# Patient Record
Sex: Female | Born: 1935 | Race: White | Hispanic: No | Marital: Married | Smoking: Never smoker
Health system: Southern US, Community
[De-identification: ages and names within clinical notes are randomized; demographics above are authoritative.]

## PROBLEM LIST (undated history)

## (undated) DIAGNOSIS — I4891 Unspecified atrial fibrillation: Secondary | ICD-10-CM

## (undated) DIAGNOSIS — M1990 Unspecified osteoarthritis, unspecified site: Secondary | ICD-10-CM

## (undated) DIAGNOSIS — I639 Cerebral infarction, unspecified: Secondary | ICD-10-CM

## (undated) DIAGNOSIS — I10 Essential (primary) hypertension: Secondary | ICD-10-CM

## (undated) DIAGNOSIS — I509 Heart failure, unspecified: Secondary | ICD-10-CM

## (undated) DIAGNOSIS — I739 Peripheral vascular disease, unspecified: Secondary | ICD-10-CM

## (undated) DIAGNOSIS — I495 Sick sinus syndrome: Secondary | ICD-10-CM

## (undated) DIAGNOSIS — E785 Hyperlipidemia, unspecified: Secondary | ICD-10-CM

## (undated) HISTORY — DX: Hyperlipidemia, unspecified: E78.5

## (undated) HISTORY — DX: Sick sinus syndrome: I49.5

## (undated) HISTORY — DX: Peripheral vascular disease, unspecified: I73.9

## (undated) HISTORY — DX: Essential (primary) hypertension: I10

## (undated) HISTORY — PX: THYROID SURGERY: SHX805

## (undated) HISTORY — DX: Unspecified atrial fibrillation: I48.91

## (undated) HISTORY — PX: CHOLECYSTECTOMY: SHX55

## (undated) HISTORY — DX: Unspecified osteoarthritis, unspecified site: M19.90

## (undated) HISTORY — PX: PACEMAKER INSERTION: SHX728

---

## 2005-07-11 ENCOUNTER — Encounter: Payer: Self-pay | Admitting: Internal Medicine

## 2005-07-12 ENCOUNTER — Observation Stay (HOSPITAL_COMMUNITY): Admission: AD | Admit: 2005-07-12 | Discharge: 2005-07-13 | Payer: Self-pay | Admitting: Internal Medicine

## 2006-08-07 ENCOUNTER — Inpatient Hospital Stay (HOSPITAL_COMMUNITY): Admission: EM | Admit: 2006-08-07 | Discharge: 2006-08-11 | Payer: Self-pay | Admitting: Emergency Medicine

## 2006-08-07 ENCOUNTER — Ambulatory Visit: Payer: Self-pay | Admitting: Cardiology

## 2006-10-06 ENCOUNTER — Emergency Department (HOSPITAL_COMMUNITY): Admission: EM | Admit: 2006-10-06 | Discharge: 2006-10-06 | Payer: Self-pay | Admitting: Emergency Medicine

## 2007-03-17 ENCOUNTER — Emergency Department (HOSPITAL_COMMUNITY): Admission: EM | Admit: 2007-03-17 | Discharge: 2007-03-17 | Payer: Self-pay | Admitting: Emergency Medicine

## 2007-11-09 ENCOUNTER — Emergency Department (HOSPITAL_COMMUNITY): Admission: EM | Admit: 2007-11-09 | Discharge: 2007-11-09 | Payer: Self-pay | Admitting: Emergency Medicine

## 2008-03-06 ENCOUNTER — Emergency Department (HOSPITAL_COMMUNITY): Admission: EM | Admit: 2008-03-06 | Discharge: 2008-03-06 | Payer: Self-pay | Admitting: Emergency Medicine

## 2009-08-19 ENCOUNTER — Emergency Department (HOSPITAL_COMMUNITY): Admission: EM | Admit: 2009-08-19 | Discharge: 2009-08-19 | Payer: Self-pay | Admitting: Emergency Medicine

## 2010-03-20 ENCOUNTER — Encounter: Payer: Self-pay | Admitting: Interventional Cardiology

## 2010-05-15 LAB — CBC
Hemoglobin: 14 g/dL (ref 12.0–15.0)
MCH: 31.3 pg (ref 26.0–34.0)
Platelets: 272 10*3/uL (ref 150–400)
WBC: 12.6 10*3/uL — ABNORMAL HIGH (ref 4.0–10.5)

## 2010-05-15 LAB — URINE CULTURE
Colony Count: NO GROWTH
Culture: NO GROWTH

## 2010-05-15 LAB — BASIC METABOLIC PANEL
Calcium: 8.9 mg/dL (ref 8.4–10.5)
GFR calc Af Amer: >60 mL/min (ref >60)
GFR calc non Af Amer: >60 mL/min (ref >60)
Potassium: 3.3 mEq/L — ABNORMAL LOW (ref 3.5–5.1)
Sodium: 127 mEq/L — ABNORMAL LOW (ref 135–145)

## 2010-05-15 LAB — URINALYSIS, ROUTINE W REFLEX MICROSCOPIC
Hgb urine dipstick: NEGATIVE
Nitrite: POSITIVE — AB
Protein, ur: NEGATIVE mg/dL
Specific Gravity, Urine: 1.018 (ref 1.005–1.030)
pH: 5 (ref 5.0–8.0)

## 2010-05-15 LAB — URINE MICROSCOPIC-ADD ON

## 2010-05-15 LAB — DIFFERENTIAL
Basophils Relative: 0 % (ref 0–1)
Eosinophils Relative: 0 % (ref 0–5)
Lymphocytes Relative: 3 % — ABNORMAL LOW (ref 12–46)
Monocytes Relative: 4 % (ref 3–12)
Neutro Abs: 11.7 10*3/uL — ABNORMAL HIGH (ref 1.7–7.7)

## 2010-05-15 LAB — PROTIME-INR: Prothrombin Time: 21.1 seconds — ABNORMAL HIGH (ref 11.6–15.2)

## 2010-06-13 LAB — URINALYSIS, ROUTINE W REFLEX MICROSCOPIC
Bilirubin Urine: NEGATIVE
Glucose, UA: NEGATIVE mg/dL
Hgb urine dipstick: NEGATIVE
Protein, ur: 30 mg/dL — AB
Specific Gravity, Urine: 1.027 (ref 1.005–1.030)
Urobilinogen, UA: 1 mg/dL (ref 0.0–1.0)
pH: 6 (ref 5.0–8.0)

## 2010-06-13 LAB — URINE MICROSCOPIC-ADD ON

## 2010-07-01 ENCOUNTER — Emergency Department (HOSPITAL_COMMUNITY): Payer: Medicare Other

## 2010-07-01 ENCOUNTER — Emergency Department (HOSPITAL_COMMUNITY)
Admission: EM | Admit: 2010-07-01 | Discharge: 2010-07-01 | Disposition: A | Discharge status: Home / Self Care | Payer: Medicare Other | Attending: Emergency Medicine | Admitting: Emergency Medicine

## 2010-07-01 DIAGNOSIS — I4891 Unspecified atrial fibrillation: Secondary | ICD-10-CM | POA: Insufficient documentation

## 2010-07-01 DIAGNOSIS — R42 Dizziness and giddiness: Secondary | ICD-10-CM | POA: Insufficient documentation

## 2010-07-01 DIAGNOSIS — I10 Essential (primary) hypertension: Secondary | ICD-10-CM | POA: Insufficient documentation

## 2010-07-01 DIAGNOSIS — E871 Hypo-osmolality and hyponatremia: Secondary | ICD-10-CM | POA: Insufficient documentation

## 2010-07-01 DIAGNOSIS — E119 Type 2 diabetes mellitus without complications: Secondary | ICD-10-CM | POA: Insufficient documentation

## 2010-07-01 DIAGNOSIS — Z950 Presence of cardiac pacemaker: Secondary | ICD-10-CM | POA: Insufficient documentation

## 2010-07-01 LAB — URINALYSIS, ROUTINE W REFLEX MICROSCOPIC
Bilirubin Urine: NEGATIVE
Hgb urine dipstick: NEGATIVE
Protein, ur: NEGATIVE mg/dL
Urobilinogen, UA: 0.2 mg/dL (ref 0.0–1.0)

## 2010-07-01 LAB — DIFFERENTIAL
Basophils Absolute: 0 10*3/uL (ref 0.0–0.1)
Basophils Relative: 0 % (ref 0–1)
Eosinophils Absolute: 0.2 10*3/uL (ref 0.0–0.7)
Lymphocytes Relative: 22 % (ref 12–46)
Lymphs Abs: 2.2 10*3/uL (ref 0.7–4.0)
Monocytes Relative: 10 % (ref 3–12)

## 2010-07-01 LAB — COMPREHENSIVE METABOLIC PANEL
ALT: 12 U/L (ref 0–35)
AST: 20 U/L (ref 0–37)
CO2: 28 mEq/L (ref 19–32)
Calcium: 9.9 mg/dL (ref 8.4–10.5)
Chloride: 89 mEq/L — ABNORMAL LOW (ref 96–112)
GFR calc Af Amer: >60 mL/min (ref >60)
GFR calc non Af Amer: >60 mL/min (ref >60)
Sodium: 128 mEq/L — ABNORMAL LOW (ref 135–145)

## 2010-07-01 LAB — CBC
Hemoglobin: 15.1 g/dL — ABNORMAL HIGH (ref 12.0–15.0)
MCH: 30.6 pg (ref 26.0–34.0)
MCHC: 34.7 g/dL (ref 30.0–36.0)
Platelets: 339 10*3/uL (ref 150–400)
RDW: 12.8 % (ref 11.5–15.5)

## 2010-07-01 LAB — CK TOTAL AND CKMB (NOT AT ARMC): Total CK: 35 U/L (ref 7–177)

## 2010-07-12 NOTE — H&P (Signed)
NAMEJUDEA, [name]             ACCOUNT NO.:  [id number]   MEDICAL RECORD NO.:  [id number]          PATIENT TYPE:  INP   LOCATION:  1825                         FACILITY:  [facility]   PHYSICIAN:  Lowella Bandy, MD      DATE OF BIRTH:  [birth year]   DATE OF ADMISSION:  08/07/2006  DATE OF DISCHARGE:                              HISTORY & PHYSICAL   PRIMARY CARE PHYSICIAN:  Dr. Burna Mortimer.   PRIMARY CARDIOLOGIST:  Corky Crafts, M.D.   CHIEF COMPLAINT:  Palpitations.   HISTORY OF PRESENT ILLNESS:  The patient is a very pleasant 77 year old  Caucasian female with a history of hypertension, diabetes, and  hyperlipidemia, who started having palpitations yesterday evening that  initially subsided.  They then recurred this afternoon and got  progressively worse causing her significant anxiety.  She denies any  associated chest pain, shortness of breath, syncope, near syncope, or  dizziness.  She denies any problems with focal weakness, numbness, or  speech difficulty.  Due to the persistent palpitations and her concern,  she activated EMS and was found to be in atrial fibrillation with rapid  ventricular response.  Her heart rate was initially approximately 120 on  arrival to the emergency department.   PAST MEDICAL HISTORY:  1. Diabetes mellitus.  2. Hypertension.  3. Hyperlipidemia.   MEDICATIONS:  1. Triamterene/hydrochlorothiazide 37.5/25 one tablet once a day.  2. Labetalol 100 mg p.o. b.i.d.  3. Captopril, the patient uncertain of dose.  4. Glyburide 5 mg p.o. once a day.  5. Zocor 20 mg p.o. once a day.   ALLERGIES:  SULFA, but has tolerated hydrochlorothiazide.   SOCIAL HISTORY:  The patient is not a smoker and does not drink alcohol.  She lives with her husband and has been married for over 64 years.   FAMILY HISTORY:  She had two brothers die of myocardial infarction.   REVIEW OF SYSTEMS:  She reports that she had a mechanical fall  approximately 3 weeks  ago, which was the first significant fall that she  has had.  She did not suffer any significant injury from this event.  Otherwise, 10 systems reviewed and negative other than as noted above in  the HPI.   PHYSICAL EXAMINATION:  VITAL SIGNS:  Blood pressure 111/41, pulse 85  (atrial fibrillation by monitoring), oxygen saturation 97% on room air.  These vitals were obtained on Diltiazem drip at 5 mg an hour.  GENERAL:  The patient is breathing comfortably in no apparent distress.  HEENT:  Normocephalic and atraumatic.  No icterus.  Oropharynx clear.  NECK:  Supple.  No carotid bruits.  No JVD.  CARDIOVASCULAR:  Irregularly irregular rhythm, no murmurs, rubs, or  gallops.  CHEST:  Clear to auscultation bilaterally.  ABDOMEN:  Soft, nontender, and nondistended.  EXTREMITIES:  Warm, no edema.  NEUROLOGY:  Alert and oriented x3.  Cranial nerves II-XII grossly  intact.  Moving all extremities well.  SKIN:  No rashes.  MUSCULOSKELETAL:  No joint effusions or erythema.  PSYCHIATRIC:  Affect is pleasant and appropriate.  The patient is alert  and  oriented.   LABORATORY DATA:  Pending.   EKG shows atrial fibrillation at 94 beats per minute, low voltage, no ST  or T wave abnormalities.   PREVIOUS CARDIAC STUDIES:  She had an Adenosine SPECT in May of 2007  that was negative for inducible ischemia and showed normal ejection  fraction of 71%.   ASSESSMENT:  An 77 year old female with diabetes, hypertension, and  hyperlipidemia who presents with presumed new onset atrial fibrillation  with rapid ventricular response.  Her rate has easily been brought under  control in the emergency department with low dose Diltiazem drip.  She  is aware of her atrial fibrillation but otherwise asymptomatic.   PLAN:  1. We will start metoprolol 50 mg p.o. b.i.d. in an attempt to wean      her off the Diltiazem drip.  2. Given that she has not had any recent concerns for bleeding or      contraindications,  we will initiate therapeutic Lovenox at 1 mg/kg      every 12 hours for initial anticoagulation.  Dr. Eldridge Dace can      address the consideration of longterm anticoagulation after further      discussions with the patient.  3. We will check admission labs, cycle cardiac enzymes, and check      thyroid functions.  4. We will continue the remainder of her home medications.  I have not      started Captopril given that we do not know her dose and her blood      pressure is under control at present in any event.  5. We will hold her NPO after midnight tonight for possible      transesophageal echocardiogram and DC cardioversion tomorrow.      Lowella Bandy, MD  Electronically Signed     JJC/MEDQ  D:  08/07/2006  T:  08/07/2006  Job:  [id number]

## 2010-07-12 NOTE — Op Note (Signed)
NAMEIVERNA, HAMMAC             ACCOUNT NO.:  [id number]   MEDICAL RECORD NO.:  [id number]          PATIENT TYPE:  INP   LOCATION:  2025                         FACILITY:  [facility]   PHYSICIAN:  Francisca December, M.D.  DATE OF BIRTH:  [birth year]   DATE OF PROCEDURE:  08/08/2006  DATE OF DISCHARGE:                               OPERATIVE REPORT   PROCEDURES PERFORMED:  1. Insertion of dual chamber permanent transvenous pacemaker.  2. Left subclavian venogram.   INDICATIONS:  [name] is an 77 year old woman who was admitted  August 07, 2006, with new onset atrial fibrillation.  She had rapid  ventricular response.  She was treated with Diltiazem and metoprolol.  She subsequently converted to sinus rhythm during the evening and had a  4.6 second pause in her heart rate.  She is brought to the  catheterization laboratory at this time for insertion of a dual chamber  permanent transvenous pacemaker with a diagnosis of tachybrady syndrome.   PROCEDURE NOTE:  The patient was brought to the cardiac catheterization  laboratory in a fasting state.  The left prepectoral region was prepped  and draped in the usual sterile fashion.  Local anesthesia was obtained  with infiltration of 1% lidocaine with epinephrine throughout.  A 6-7 cm  incision was then made in the deltopectoral groove and this was carried  down by sharp dissection and electrocautery to the prepectoral fascia.  There, a pocket was formed inferiorly and medially using electrocautery  and blunt dissection.  It was packed with 1% kanamycin soaked gauze.  A  left subclavian venogram was then performed with a peripheral injection  of 20 mL of Omnipaque.  The venogram did demonstrate the subclavian vein  to be widely patent and coursing in a normal fashion over the anterior  surface of the first rib and beneath the middle third of the clavicle.  There was no evidence for persistence of the left superior vena cava,  although the  left subclavian vein was quite tortuous.   The subclavian vein was then punctured twice using a micropuncture  needle.  Two 0.038 inch guidewires were placed.  Over the initial  guidewire, a 7-French tearaway sheath and dilator was advanced.  The  dilator and wire were removed and the ventricular lead was advanced to  the level of the right atrium.  The sheath was torn away.  Using  standard technique and fluoroscopic landmarks, the lead was manipulated  into the right ventricular apex.  There, excellent pacing parameters  were obtained as will be noted below.  The lead was tested for  diaphragmatic pacing at 10 volts and none was found.  The lead was then  sutured into place using three separate silk ligatures.  Over the  remaining guidewire, a second 7-French tearaway sheath and dilator were  advanced.  The dilator and wire were removed and the atrial lead was  advanced to the level of the right atrium and the sheath was torn away.  Again, using standard technique and fluoroscopic landmarks, the lead was  manipulated into the right atrial appendage.  This was an  active  fixation lead and the screw was advanced as appropriate.  It was tested  for adequate pacing parameters and this is reported below.  It was also  tested for diaphragmatic pacing at 10 volts and none was found.  The  lead was then sutured into place using three separate silk ligatures.   The kanamycin soaked gauze was then removed from the pocket.  The pocket  was copiously irrigated using 1% kanamycin solution.  The leads were  then attached the pacing generator, carefully identifying each by its  serial number, and placing each into the appropriate receptacle.  Each  lead was tightened into place and tested for security.  The leads were  then wound beneath the pacing generator and the generator was placed in  the pocket.  An 0 silk anchoring suture was applied.  The wound was then  inspected for bleeding and full  hemostasis obtained.  The wound was then  closed using 2-0 Vicryl in a running fashion in the subcutaneous layer.  The skin was approximated using 4-0 Vicryl in a running subcuticular  fashion.  Steri-Strips and a sterile dressing were applied and the  patient is transported to the recovery area in stable condition.   EQUIPMENT DATA:  The pacing generator is a Medtronic Enrhythm, model  number [id number], serial number [id number].  The atrial lead is a  Medtronic, model number [id number], serial number [id number].  The ventricular  lead is a Medtronic, model number [id number], serial number [id number].   PACING DATA:  The ventricular lead detected a 9.1 mV R-wave.  The pacing  threshold was 0.5 volts at 0.5 milliseconds pulse width.  The impedance  was 700 ohms resulting in a current at capture threshold of 0.6 MA.  The  atrial lead detected a 5.1 mV P-wave.  The pacing threshold was 1.9  volts at 0.5 milliseconds pulse width.  The impedance was 765 ohms  resulting in a current at capture threshold of 3.8 MA.      Francisca December, M.D.  Electronically Signed     JHE/MEDQ  D:  08/08/2006  T:  08/08/2006  Job:  [id number]   cc:   Corky Crafts, MD

## 2010-07-15 NOTE — Discharge Summary (Signed)
NAMECARLY, [name]             ACCOUNT NO.:  [id number]   MEDICAL RECORD NO.:  [id number]          PATIENT TYPE:  INP   LOCATION:  2025                         FACILITY:  [facility]   PHYSICIAN:  Corky Crafts, MDDATE OF BIRTH:  24-Nov-1918   DATE OF ADMISSION:  08/07/2006  DATE OF DISCHARGE:  08/11/2006                               DISCHARGE SUMMARY   DISCHARGE DIAGNOSES:  1. Sinus node dysfunction, status post dual-chamber implantation,      Medtronic type.  2. Atrial fibrillation.  3. Hypertension.  4. Diabetes mellitus.  5. Hyperlipidemia.   HOSPITAL COURSE:  [name] is an 77 year old female who had 24  hours worth of palpitations.  She presented to the emergency room and  found to be in atrial fibrillation with RVR.  She was felt to have a new  onset atrial fibrillation and ultimately we had planned to cardiovert  the patient, but overnight she had spontaneously converted into normal  sinus rhythm.  While in sinus rhythm, she had a 4.7 second pause and  ultimately, she did require permanent pacemaker.   On August 08, 2006, she required a dual-chamber pacemaker implantation,  Medtronic device, under the care of Dr. Corliss Marcus.  The patient  tolerated the procedure well.  The pacemaker was interrogated the  following day and was functioning properly.   Unfortunately, postoperatively she did redevelop her atrial fibrillation  with RVR and we had to maximize her medications.  In addition, she was  started on sotalol and monitored over the next 48 hours to assure that  her QTC was not prolonging.  QTC on discharge was 476 milliseconds.  Patient was discharged to home in stable and improved condition.   LABORATORY DATA:  Include a white count of 11.0, hemoglobin 12.7,  hematocrit 37.5, platelets 264.  Sodium 137, potassium 4.1, BUN 11,  creatinine 0.76.  Hemoglobin A1c 6.7.  Maximum troponin 0.39.  TSH  3.031.  Free T4 1.35.  EKG, on admission, while in normal  sinus rhythm,  fast bradycardiac rate of 51 with no acute ST/T wave changes (prepacer).   DISCHARGE MEDICATIONS:  1. Enteric-coated aspirin 81 mg a day.  2. Coumadin 5 mg a day.  3. Sotalol 80 mg p.o. b.i.d.  4. Zocor 20 mg a day.  5. Glyburide 5 mg.  6. Metoprolol ER 50 mg a day.  7. The patient is instructed to stop captopril and labetalol.   Clean wound and continue activity as per specific instruction sheet  regarding device implantation.  The patient is to follow up with the  [facility] and Dr. Amil Amen' PA on August 16, 2006 at 1 p.m., then the  patient is to return to see Dr. Eldridge Dace for cardiac followup on September 04, 2006 at 9 a.m.      Guy Franco, P.A.      Corky Crafts, MD  Electronically Signed    LB/MEDQ  D:  09/05/2006  T:  09/06/2006  Job:  [id number]   cc:   Francine Graven, M.D.

## 2010-07-15 NOTE — Discharge Summary (Signed)
NAME:  [name], [name]             ACCOUNT NO.:  [id number]   MEDICAL RECORD NO.:  [id number]          PATIENT TYPE:  INP   LOCATION:  3707                         FACILITY:  [facility]   PHYSICIAN:  Melissa L. Ladona Ridgel, MD  DATE OF BIRTH:  March 29, 1918   DATE OF ADMISSION:  07/12/2005  DATE OF DISCHARGE:  07/13/2005                                 DISCHARGE SUMMARY   Chief complaint at the time of admission with abdominal pain, palpitations,  dizziness and shortness of breath.   DISCHARGE DIAGNOSES:  1.  Transient abdominal pain:  The patient had no recurrent abdominal      discomfort during the hospital stay at home.  The pain was relieved with      movement of her bowels.  Suspected constipation is at the source of her      issue for that.  2.  Dizziness, palpitations and shortness of breath:  The clinical      significance of these transient symptoms on two separate episodes led [place]      to rule out a possible arrhythmia secondary to ischemia.  The patient      was seen and evaluated by cardiology.  She underwent a cardiac stress      test which showed no obvious ischemic changes.  Her ejection fraction      was 73%.  She had no recurrent symptoms of dizziness or palpitations.      Please note during the course of the treadmill test, she had one brief      burst ventricular tachycardia of minimal significant according to      cardiology.  She did not require any further monitoring and was      discharged to home.  No changes to her medications were made and she was      instructed to follow up with Dr. Madison Hickman as she had had an      appointment the day that she came to the hospital.  We will ask her to      make the appointment next week to follow up on the other medical issues      that she was going to see Dr. Daphine Deutscher for.  At this time, cardiology may      no follow-up recommendations accept as per needed.  3.  Diabetes:  The patient was maintained on her metformin and should  follow      up with Dr. Daphine Deutscher regarding her hemoglobin A1c and ophthalmology and      podiatric consult.  4.  Hyperlipidemia:  The patient was maintained on her Zocor.  5.  Urinary tract infection based on her urinalysis.  The patient had had      some strong smelling urine and some dysuria.  Her urinalysis showed      positive leukocyte esterase.  She was placed on Cipro for a total course      of four days.  She had no Foley catheter during the course of the      hospital stay and her final urine cultures have been negative.   MEDICATIONS AT  THE TIME OF DISCHARGE:  1.  Captopril 100 mg 1/2 tablet three times daily.  2.  Metformin 500 mg 2 tablets in the a.m.  3.  Triamterene/hydrochlorothiazide 37.5 mg/25 mg once daily.  4.  Zocor 20 mg once daily.  5.  Aspirin 81 mg once daily.  6.  Ocuvite 1 tablet daily.  7.  Lopressor 25 mg once daily.  8.  Cipro 500 mg p.o. b.i.d. for two more days.   HOSPITAL COURSE:  This is a pleasant 77 year old white female who presented  to the emergency room after having two episodes of dizziness, palpitations,  shortness of breath and weakness.  Each time the patient was exerting  herself coming from the restroom.  The patient was admitted to telemetry,  monitored overnight, found to have no dysrhythmia.  Her cardiac markers were  negative.  A cardiology consult was undertaken and it was determined that  her symptoms were suspicious enough for possible intermittent dysrhythmia  versus ischemia that she was asked to undergo cardiac stress test.  The  patient was transferred from [facility] to [facility].  Underwent cardiac stress test which showed a few beats of ventricular  tachycardia but no other abnormalities or indications of ischemia.  Her  ejection fraction was 73%.  The patient was therefore cleared to discharge  to home to follow up with her primary care practitioner.   PERTINENT LABORATORY VALUES:  During the course of  hospital stay, her urine  culture was negative.  Her urinalysis, however, with positive.  Cardiac  markers were within normal limits.  Her TSH was 1.578 within normal limits.  Her hemoglobin A1c was 6.1.  Her D-dimer was 0.38.  Her discharging BUN was  19 with a creatinine of 0.9 and a potassium of 4.6.   A 2-D echo was not completed as the patient recently had a work up with Dr.  Eldridge Dace as an outpatient and the general report was that her cardiac  function was within normal limits.  An ejection is unavailable to me at this  time other than from her stress test of 73%.   At the time of discharge, the patient was deemed stable to complete a two  more days on her antibiotic therapy and follow up with Dr. Madison Hickman  next week. Her disposition was to home.      Melissa L. Ladona Ridgel, MD  Electronically Signed     MLT/MEDQ  D:  07/15/2005  T:  07/15/2005  Job:  [id number]   cc:   Darius Bump, M.D.  Fax: [phone]   Corky Crafts, MD  Fax: [phone]

## 2010-07-15 NOTE — H&P (Signed)
NAME:  [name], [name]             ACCOUNT NO.:  [id number]   MEDICAL RECORD NO.:  [id number]          PATIENT TYPE:  INP   LOCATION:  0104                         [facility]ACILITY:  [facility]   PHYSICIAN:  Melissa L. Ladona Ridgel, MD  DATE OF BIRTH:  November 11, 1918   DATE OF ADMISSION:  07/11/2005  DATE OF DISCHARGE:                                HISTORY & PHYSICAL   CHIEF COMPLAINT:  Abdominal cramping, palpitations, dizziness, and shortness  of breath.   PRIMARY CARE PHYSICIAN:  Darius Bump, M.D.   HISTORY OF PRESENT ILLNESS:  The patient is an 77 year old white female with  a past medical history for hypertension since the age of 66 and diabetes.  The patient presents today to the emergency room with onset of symptoms of  dizziness, palpitations, and shortness of breath x2 episodes.  The patient  states that yesterday she had some abdominal cramping that was relieved  after having a bowel movement.  She states that she came out of the bathroom  and felt lightheaded and dizzy with a little bit of palpitations, so she  went to lay down.  The symptoms resolved and her abdominal pain went away.  Today, however, she got out of the shower and had a similar episode which  was more severe.  She noted palpitations, shortness of breath, dizziness,  the feeling of faintness, and had difficulty talking.  She states the only  thing that relieved the symptoms was when the EMS workers placed oxygen on  her.  The palpitations settled after resting on the bed but she continued to  be a little bit dyspneic.   REVIEW OF SYSTEMS:  She describes dizziness and palpitations x2 episodes  yesterday and today.  She states she has been shaky and weak and that she  could not talk during the episode this morning.  She describes no chest  pain.  She has no PND, no orthopnea, no nausea, no vomiting.  She never had  symptoms like this before.  She recently changed from verapamil to Lopressor  and was given Lasix with  potassium after gaining several pounds on the  verapamil.  Her cardiologist felt that the weight gain likely was secondary  to that and switched her over to Lopressor.  She completed a course of the  Lasix and potassium, is no longer taking that and has a 10-pound weight loss  as a result of the diuresis.   PAST MEDICAL HISTORY:  1.  Diabetes.  2.  Hypertension.  3.  Goiter.   PAST SURGICAL HISTORY:  1.  She had gallbladder removed.  2.  She had a goiter removed.  3.  She does not remember whether or not she had a total or subtotal      thyroidectomy.  However, she has not been taking any medication for her      thyroid.   SOCIAL HISTORY:  She denies tobacco, ethanol.  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   FAMILY HISTORY:  Mom and Dad are both deceased but both had hypertension.   ALLERGIES:  No known  drug allergies.   MEDICATIONS:  Recently have changed and are as follows:  1.  She is taking captopril 1.5 tablets of 100 mg three times daily.  2.  She takes Metformin which was recently changed to two tablets of 500 mg      in the morning .  3.  She takes Triamterene/hydrochlorothiazide 37.5/25 once daily.  4.  She takes Zocor 20 mg once daily.  5.  She takes an  Ocuvite once daily.  6.  Baby aspirin once daily.  7.  Metoprolol 25 mg once daily.  8.  Recent finished furosemide 20 mg with 10 of potassium a day.  This was      given to her by Dr. Eldridge Dace.   PHYSICAL EXAMINATION:  VITAL SIGNS:  Temperature is 98.3, blood pressure  initially varied from 113/49 to 186/87, pulse was 92 to 115, and appeared to  be sinus tachycardia throughout the hospital course.  Her respiratory rate  was 17 to 20.  Saturations 96%.  GENERAL:  She is currently in no acute distress.  HEENT:  She is normocephalic atraumatic.  Pupils are equal, round, and  reactive to light.  Extraocular muscles are intact.  Mucous membranes are  moist.  Her tympanic membranes are intact.  NECK:   Supple.  There is no JVD, no lymph nodes, no carotid bruit.  Neck is  supple without enlargement of the thyroid.  CHEST:  Clear to auscultation with no rhonchi, rales, or wheezes.  CARDIOVASCULAR:  Regular rate and rhythm.  Positive S1 S2.  No S3, S4.  No  murmurs, rubs, or gallops.  ABDOMEN:  Soft, nontender, nondistended with positive bowel sounds and  moderately obese.  EXTREMITIES:  Show no clubbing, cyanosis, or edema.  NEUROLOGIC:  She is awake, alert, and oriented x3.  Cranial nerves II-XII  are intact.  Power is 5/5.  DTRs are 2+.  Plantars are downgoing.   PERTINENT LABORATORY VALUES:  Her white count is 10.6.  Her hemoglobin is  14.7, hematocrit is 43.6, platelets are 356.  Her sodium is 136, potassium  is 4.1, chloride is 98, CO2 is 27, BUN is 23, creatinine is 1.1, and glucose  is 195.  Her LFTs are within normal limits.  Chest x-ray was not completed.  EKG shows a sinus tachycardia at 101 with no ST elevation.  Urinalysis shows  moderate leukocyte esterase and white blood cells of 3-6.   ASSESSMENT:  1.  This is an 77 year old white female with a past medical history for      hypertension and diabetes.  She presents with episodic dizziness and      shortness of breath with palpitations, suspicious for possible      dysrhythmia.  The patient will be admitted for observation for atrial      fibrillation or SVT.  2.  The patient also appears to have a urinary tract infection which will be      treated.   PLAN:  1.  Cardiovascular.  Admit to telemetry, continue metoprolol, check cardiac      markers as well as D-dimer.  If the D-dimer is elevated, we may need to      consider a CT of the chest to rule out pulmonary embolus.  2.  Pulmonary.  We will check a chest x-ray and a D-dimer.  As stated we      will potentially rule out for a PE.  3.  GI.  Recent constipation.  We will start  her on a stool softener. 4.  GU.  Possible UTI.  We will check a urine culture and start her  on oral      Cipro.  5.  Endocrine.  We will check a TSH, a hemoglobin A1c, and continue her oral      diabetic medications.  6.  DVT prophylaxis will be with Lovenox.  At this time we will do      prophylactic dosing but if she has an elevated D-dimer or elevated      cardiac markers, we may need to increase that to 1 mg/kg.      Melissa L. Ladona Ridgel, MD  Electronically Signed     MLT/MEDQ  D:  07/11/2005  T:  07/11/2005  Job:  [id number]   cc:   Darius Bump, M.D.  Fax: [phone]   Corky Crafts, MD  Fax: [phone]

## 2010-07-15 NOTE — Consult Note (Signed)
[name], [name]             ACCOUNT NO.:  [id number]   MEDICAL RECORD NO.:  [id number]          PATIENT TYPE:  INP   LOCATION:  0102                         FACILITY:  [facility]   PHYSICIAN:  [name], MDDATE OF BIRTH:  1918/09/05   DATE OF CONSULTATION:  07/12/2005  DATE OF DISCHARGE:                                   CONSULTATION   CONSULTING PHYSICIAN:  [name], M.D.   REASON FOR CONSULTATION:  Palpitations, shortness of breath.   [name] is an 77 year old female with no known history of coronary  artery disease.  She states that she underwent a Cardiolite study in  [place] in 2000, and it was normal.  She saw Dr. Eldridge Johnson for an  initial consultation, on June 19, 2005, for lower extremity edema.  Ultimately, she was changed from verapamil to Toprol and with the Lasix and  potassium supplement for a month and her edema subsided.  She has been  stable with no edema since that time.  As part of his workup, a 2D echo was  performed.  It showed mild aortic sclerosis, mild AI, and a normal EF.   We have been consulted today to evaluate the patient due to development of  palpitations, dyspnea, shakiness, and presyncope.  The patient denies any  chest pain, nausea or vomiting.  She admits to having some hypoglycemic  episodes in the past but not similar to the episode she had yesterday.  In  fact, on admission her glucose was 195.   ALLERGIES:  SULFA DRUGS.   MEDICATIONS:  1.  Captopril 150 mg t.i.d.  2.  Metformin 1 gram b.i.d.  3.  Maxzide 37.5/25, one tablet daily.  4.  Zocor 20 mg a day.  5.  Ocuvite daily.  6.  Baby aspirin daily.  7.  Toprol XL 25 mg a day.   SOCIAL HISTORY:  Married, no tobacco, alcohol, or illicit drug use.   FAMILY HISTORY:  Mom died secondary to hypertension and kidney failure.  She  never allowed her physicians to place her on dialysis.  Her father died of  hypertension and lung cancer.  Two brothers died  secondary to myocardial  infarctions.  One sister died secondary to stomach cancer.  One sister died  secondary to uterine cancer.   PAST MEDICAL HISTORY:  1.  Diabetes mellitus.  2.  Hard of hearing.  3.  Hypertension .  4.  Hyperlipidemia.  5.  Status post goiter removal.  6.  Cholecystectomy.  7.  History of arthritis.  8.  History of long term medication use.   PHYSICAL EXAMINATION:  VITAL SIGNS:  Temp 98.4, blood pressure 130/40, pulse  83, respirations 18.  HEENT:  Grossly normal.  No carotid or subclavian bruits.  No JVD or  thyromegaly.  Sclerae are clear.  Conjunctivae normal.  Nares without  drainage.  LUNGS:  Clear to auscultation bilaterally.  No wheezing or rhonchi.  HEART:  Regular rate and rhythm.  Distant S1 S2.  No gross murmur.  ABDOMEN:  Obese.  Good bowel sounds.  Nontender, nondistended.  No masses.  No  bruits.  EXTREMITIES:  No peripheral edema.  Faint dorsalis pedis pulses bilaterally.  Warm with good perfusion.  SKIN:  Warm and dry.  NEUROLOGIC:  Cranial nerves II-XII grossly intact.  She is alert and  oriented.   EKG:  Sinus tachycardia, rate of 101 with a first degree A-V block,  nonspecific ST-T wave changes laterally, and appears to be unchanged from  previous office EKG.   LABORATORY STUDIES:  Show D-dimer 0.38.  TSH 1.578.  Hemoglobin 13.7,  hematocrit 40.3, platelets 313, white count 8.4.  Sodium 138, potassium 4.6,  BUN 19, creatinine 0.9, glucose 169.  CK-MB and troponin were all negative.   Chest x-ray, official results are still pending.   ASSESSMENT:  1.  Palpitations.  2.  Dyspnea.  3.  Presyncope.  4.  Hypertension.  5.  Diabetes mellitus.  6.  Hyperlipidemia.  7.  Multiple cardiac risk factors including family history, age, obesity,      hypertension, hyperlipidemia, and diabetes.   PLAN:  In reviewing the patient's past medical history and current symptoms,  I have discussed this with Dr. Eldridge Johnson and we feel the patient needs  an  adenosine Cardiolite while she is an inpatient.  She will need to transfer  to [facility] as she has not already been transferred as an  inpatient.   This patient was seen by [name].  I did not see the patient as she was  planning on leaving the hospital against medical advice prior to my arrival  at [facility].  The plan of care was discussed with me.  -[name], P.A.      Wanda Crafts, MD  Electronically Signed    LB/MEDQ  D:  07/12/2005  T:  07/12/2005  Job:  [id number]   cc:   Wanda Crafts, MD  Fax: [phone]   [name], M.D.  Fax: [phone]

## 2010-11-30 LAB — POCT I-STAT, CHEM 8
BUN: 18
BUN: 20
Calcium, Ion: 0.94 — ABNORMAL LOW
Chloride: 103
Chloride: 103
Creatinine, Ser: 0.8
Potassium: 4.6
Potassium: 5.3 — ABNORMAL HIGH
Sodium: 130 — ABNORMAL LOW

## 2010-11-30 LAB — PROTIME-INR: INR: 2.3 — ABNORMAL HIGH

## 2010-12-12 LAB — CBC
HCT: 42.6
Hemoglobin: 14.6
MCHC: 34.2
MCV: 92.7
RBC: 4.6

## 2010-12-12 LAB — I-STAT 8, (EC8 V) (CONVERTED LAB)
BUN: 12
Glucose, Bld: 249 — ABNORMAL HIGH
HCT: 47 — ABNORMAL HIGH
Hemoglobin: 16 — ABNORMAL HIGH
Operator id: 151321
Potassium: 4.2
Sodium: 135
TCO2: 34

## 2010-12-12 LAB — PROTIME-INR: Prothrombin Time: 38.1 — ABNORMAL HIGH

## 2010-12-12 LAB — DIFFERENTIAL
Basophils Relative: 0
Eosinophils Absolute: 0.2
Lymphs Abs: 2
Monocytes Absolute: 1.1 — ABNORMAL HIGH
Monocytes Relative: 9

## 2010-12-12 LAB — POCT I-STAT CREATININE: Creatinine, Ser: 0.9

## 2010-12-15 LAB — TROPONIN I
Troponin I: 0.26 — ABNORMAL HIGH
Troponin I: 0.39 — ABNORMAL HIGH

## 2010-12-15 LAB — PROTIME-INR
INR: 1.1
INR: 1.1
Prothrombin Time: 13.9
Prothrombin Time: 13.9

## 2010-12-15 LAB — B-NATRIURETIC PEPTIDE (CONVERTED LAB): Pro B Natriuretic peptide (BNP): 118 — ABNORMAL HIGH

## 2010-12-15 LAB — COMPREHENSIVE METABOLIC PANEL
ALT: 17
Alkaline Phosphatase: 55
BUN: 6
CO2: 25
GFR calc non Af Amer: >60
Glucose, Bld: 173 — ABNORMAL HIGH
Potassium: 3.4 — ABNORMAL LOW
Sodium: 132 — ABNORMAL LOW
Total Bilirubin: 0.7
Total Protein: 6.4

## 2010-12-15 LAB — BASIC METABOLIC PANEL
BUN: 11
Calcium: 9.1
GFR calc non Af Amer: >60
Glucose, Bld: 91

## 2010-12-15 LAB — HEMOGLOBIN A1C: Hgb A1c MFr Bld: 6.7 — ABNORMAL HIGH

## 2010-12-15 LAB — APTT: aPTT: 32

## 2010-12-15 LAB — CBC
Hemoglobin: 13.9
MCHC: 33.3
MCV: 94.4
Platelets: 264
Platelets: 306
RDW: 12.9
RDW: 13
WBC: 11 — ABNORMAL HIGH

## 2010-12-15 LAB — CK TOTAL AND CKMB (NOT AT ARMC)
CK, MB: 3.2
CK, MB: 3.8
CK, MB: 4.1 — ABNORMAL HIGH
Relative Index: INVALID
Total CK: 40
Total CK: 69

## 2010-12-15 LAB — T4, FREE: Free T4: 1.35

## 2011-04-07 ENCOUNTER — Encounter: Payer: Self-pay | Admitting: Vascular Surgery

## 2011-04-10 ENCOUNTER — Ambulatory Visit (INDEPENDENT_AMBULATORY_CARE_PROVIDER_SITE_OTHER): Payer: Medicare Other | Admitting: Vascular Surgery

## 2011-04-10 ENCOUNTER — Encounter (INDEPENDENT_AMBULATORY_CARE_PROVIDER_SITE_OTHER): Payer: Medicare Other | Admitting: *Deleted

## 2011-04-10 ENCOUNTER — Encounter: Payer: Self-pay | Admitting: Vascular Surgery

## 2011-04-10 VITALS — BP 153/81 | HR 69 | Resp 18 | Ht 64.0 in | Wt 193.0 lb

## 2011-04-10 DIAGNOSIS — I70269 Atherosclerosis of native arteries of extremities with gangrene, unspecified extremity: Secondary | ICD-10-CM | POA: Insufficient documentation

## 2011-04-10 DIAGNOSIS — R0989 Other specified symptoms and signs involving the circulatory and respiratory systems: Secondary | ICD-10-CM

## 2011-04-10 DIAGNOSIS — I739 Peripheral vascular disease, unspecified: Secondary | ICD-10-CM

## 2011-04-10 DIAGNOSIS — M7989 Other specified soft tissue disorders: Secondary | ICD-10-CM

## 2011-04-10 NOTE — Progress Notes (Signed)
Subjective:     Patient ID: [name], female   DOB: 12-Aug-1918, 78 y.o.   MRN: [id number]  HPI this 78 year old female was referred by Dr. Jillyn Hidden for arterial evaluation. The patient complains of some swelling in both lower extremities as well as a cold sensation. She does not ambulate except of a walker and has not for 4 years because of bad arthritis in her knees. She has no history of cellulitis, gangrene, rest pain, numbness in the feet, or definite circulatory problems. He has no history of DVT or thrombophlebitis. She does not wear last to compression stockings. She does not elevate her feet.  Past Medical History  Diagnosis Date  . Diabetes mellitus   . Hypertension   . Hyperlipidemia   . Arthritis   . AF (atrial fibrillation)   . Cataract     decreased vision in left eye  . Peripheral vascular disease     History  Substance Use Topics  . Smoking status: Never Smoker   . Smokeless tobacco: Never Used  . Alcohol Use: No    No family history on file.  Allergies  Allergen Reactions  . Nitrofurantoin Nausea And Vomiting  . Sulfa Antibiotics     Current outpatient prescriptions:aspirin 81 MG tablet, Take 160 mg by mouth daily., Disp: , Rfl: ;  beta carotene w/minerals (OCUVITE) tablet, Take 1 tablet by mouth daily., Disp: , Rfl: ;  HYDROcodone-acetaminophen (VICODIN) 5-500 MG per tablet, Take 1 tablet by mouth every 6 (six) hours as needed., Disp: , Rfl: ;  lisinopril (PRINIVIL,ZESTRIL) 40 MG tablet, Take 40 mg by mouth daily., Disp: , Rfl:  meclizine (ANTIVERT) 25 MG tablet, Take 25 mg by mouth 3 (three) times daily as needed., Disp: , Rfl: ;  metFORMIN (GLUCOPHAGE-XR) 500 MG 24 hr tablet, Take 500 mg by mouth as directed., Disp: , Rfl: ;  metoprolol (LOPRESSOR) 50 MG tablet, Take 50 mg by mouth 2 (two) times daily., Disp: , Rfl: ;  simvastatin (ZOCOR) 40 MG tablet, Take 40 mg by mouth every evening., Disp: , Rfl:  SOTALOL AF 80 MG TABS, Take by mouth. One and 1/2 tablets  twice daily, Disp: , Rfl: ;  triamcinolone cream (KENALOG) 0.1 %, Apply topically 2 (two) times daily., Disp: , Rfl: ;  triamterene-hydrochlorothiazide (MAXZIDE-25) 37.5-25 MG per tablet, Take 1 tablet by mouth daily., Disp: , Rfl: ;  trimethoprim (TRIMPEX) 100 MG tablet, Take 100 mg by mouth 2 (two) times daily., Disp: , Rfl:   BP 153/81  Pulse 69  Resp 18  Ht 5\' 4"  (1.626 m)  Wt 193 lb (87.544 kg)  BMI 33.13 kg/m2  Body mass index is 33.13 kg/(m^2).        Review of Systems denies chest pain, dyspnea on exertion, PND, orthopnea, abdominal pain. She does have a pacemaker. Denies any claudication symptoms with a small amount she ambulates with a walker. No history of stroke or TIA symptoms. All other systems negative     Objective:   Physical Exam blood pressure 153/81 heart rate 69 respirations 18 Gen.-alert and oriented x3 in no apparent distress HEENMinimal edema.T normal for age Lungs no rhonchi or wheezing Cardiovascular regular rhythm no murmurs carotid pulses 3+ palpable no bruits audible Abdomen soft nontender no palpable masses Musculoskeletal free of  major deformities Skin clear -no rashes Neurologic normal Lower extremities 3+ femoral and dorsalis pedis pulses palpable bilaterally with minimal edema. No evidence of gangrene, infection, saline this, or other vascular problems. She does have some  mild bluish discoloration of her feet. They're slightly cool to touch. There is a callus between her old right first and second toes with no ulceration.   Today I ordered lower extremity arterial Doppler exam which are reviewed and interpreted. ABIs are greater than 1.0 bilaterally. She has triphasic waveforms in all vessels of both feet.    Assessment:     No evidence of arterial insufficiency. Chronic edema with no history of DVT    Plan:     #1 would elevate the foot of her bed 2-3 inches to help improve edema during the night #2 short-leg elastic compression stockings  if she has a mechanism to get these on her this may be difficult with her flexibility. #3 intermittent elevation of the legs during the day #4 no need for any further arterial or venous evaluation

## 2012-09-25 ENCOUNTER — Encounter (HOSPITAL_COMMUNITY): Payer: Self-pay | Admitting: Emergency Medicine

## 2012-09-25 ENCOUNTER — Emergency Department (HOSPITAL_COMMUNITY)
Admission: EM | Admit: 2012-09-25 | Discharge: 2012-09-25 | Disposition: A | Discharge status: Home / Self Care | Payer: Medicare Other | Attending: Emergency Medicine | Admitting: Emergency Medicine

## 2012-09-25 DIAGNOSIS — Z950 Presence of cardiac pacemaker: Secondary | ICD-10-CM | POA: Insufficient documentation

## 2012-09-25 DIAGNOSIS — E785 Hyperlipidemia, unspecified: Secondary | ICD-10-CM | POA: Insufficient documentation

## 2012-09-25 DIAGNOSIS — E119 Type 2 diabetes mellitus without complications: Secondary | ICD-10-CM | POA: Insufficient documentation

## 2012-09-25 DIAGNOSIS — Z8669 Personal history of other diseases of the nervous system and sense organs: Secondary | ICD-10-CM | POA: Insufficient documentation

## 2012-09-25 DIAGNOSIS — R531 Weakness: Secondary | ICD-10-CM

## 2012-09-25 DIAGNOSIS — M129 Arthropathy, unspecified: Secondary | ICD-10-CM | POA: Insufficient documentation

## 2012-09-25 DIAGNOSIS — Z8679 Personal history of other diseases of the circulatory system: Secondary | ICD-10-CM | POA: Insufficient documentation

## 2012-09-25 DIAGNOSIS — Z79899 Other long term (current) drug therapy: Secondary | ICD-10-CM | POA: Insufficient documentation

## 2012-09-25 DIAGNOSIS — E860 Dehydration: Secondary | ICD-10-CM | POA: Insufficient documentation

## 2012-09-25 DIAGNOSIS — I10 Essential (primary) hypertension: Secondary | ICD-10-CM | POA: Insufficient documentation

## 2012-09-25 DIAGNOSIS — R5381 Other malaise: Secondary | ICD-10-CM | POA: Insufficient documentation

## 2012-09-25 LAB — CBC WITH DIFFERENTIAL/PLATELET
Basophils Absolute: 0 10*3/uL (ref 0.0–0.1)
Basophils Relative: 0 % (ref 0–1)
Eosinophils Absolute: 0.1 10*3/uL (ref 0.0–0.7)
Lymphs Abs: 2.3 10*3/uL (ref 0.7–4.0)
MCH: 31.6 pg (ref 26.0–34.0)
MCHC: 35.6 g/dL (ref 30.0–36.0)
Neutrophils Relative %: 69 % (ref 43–77)
Platelets: 288 10*3/uL (ref 150–400)
RBC: 4.74 MIL/uL (ref 3.87–5.11)

## 2012-09-25 LAB — BASIC METABOLIC PANEL
BUN: 27 mg/dL — ABNORMAL HIGH (ref 6–23)
Chloride: 91 mEq/L — ABNORMAL LOW (ref 96–112)
Creatinine, Ser: 1.03 mg/dL (ref 0.50–1.10)
GFR calc Af Amer: 53 mL/min — ABNORMAL LOW (ref >90)
Glucose, Bld: 149 mg/dL — ABNORMAL HIGH (ref 70–99)

## 2012-09-25 LAB — URINE MICROSCOPIC-ADD ON

## 2012-09-25 LAB — URINALYSIS, ROUTINE W REFLEX MICROSCOPIC
Hgb urine dipstick: NEGATIVE
Protein, ur: NEGATIVE mg/dL
Urobilinogen, UA: 1 mg/dL (ref 0.0–1.0)

## 2012-09-25 MED ORDER — SODIUM CHLORIDE 0.9 % IV SOLN
INTRAVENOUS | Status: DC
  Administered 2012-09-25: 15:00:00 via INTRAVENOUS

## 2012-09-25 NOTE — ED Notes (Signed)
Pt said she could not to complete the orthostatic V/s.

## 2012-09-25 NOTE — ED Provider Notes (Signed)
CSN: [id number]     Arrival date & time 09/25/12  1123 History     First MD Initiated Contact with Patient 09/25/12 1133     Chief Complaint  Patient presents with  . Weakness   (Consider location/radiation/quality/duration/timing/severity/associated sxs/prior Treatment) HPI Comments: [name] is a 79 y.o. Female who complains of weakness that started several days ago. The weakness has worsened today, when she went to get the newspaper. She had immediately walked back inside, and sit down. She felt better after sitting. She presents for evaluation by EMS. She does not feel weak now. She denies dysuria or urinary frequency. There's been no fever, chills, nausea or vomiting. She denies back pain. She was treated for a urinary tract infection with unknown antibiotic earlier this month. She completed the prescription. She has not had a followup culture. She has frequent UTIs and is managed by urology. There are no other known modifying factors.  Patient is a 79 y.o. female presenting with weakness. The history is provided by the patient.  Weakness    Past Medical History  Diagnosis Date  . Diabetes mellitus   . Hypertension   . Hyperlipidemia   . Arthritis   . AF (atrial fibrillation)   . Cataract     decreased vision in left eye  . Peripheral vascular disease    Past Surgical History  Procedure Laterality Date  . Pacemaker insertion  2008  . Cholecystectomy    . Thyroid surgery     No family history on file. History  Substance Use Topics  . Smoking status: Never Smoker   . Smokeless tobacco: Never Used  . Alcohol Use: No   OB History   Grav Para Term Preterm Abortions TAB SAB Ect Mult Living                 Review of Systems  Neurological: Positive for weakness.  All other systems reviewed and are negative.    Allergies  Nitrofurantoin and Sulfa antibiotics  Home Medications   Current Outpatient Rx  Name  Route  Sig  Dispense  Refill  . aspirin 81 MG  tablet   Oral   Take 160 mg by mouth daily.         . beta carotene w/minerals (OCUVITE) tablet   Oral   Take 1 tablet by mouth daily.         . cyanocobalamin 500 MCG tablet   Oral   Take 500 mcg by mouth every morning.         Marland Kitchen lisinopril (PRINIVIL,ZESTRIL) 40 MG tablet   Oral   Take 40 mg by mouth daily.         . metFORMIN (GLUCOPHAGE-XR) 500 MG 24 hr tablet   Oral   Take 500 mg by mouth 3 (three) times daily.          . simvastatin (ZOCOR) 40 MG tablet   Oral   Take 40 mg by mouth every evening.         Marland Kitchen SOTALOL AF 80 MG TABS   Oral   Take 120 mg by mouth 2 (two) times daily. One and 1/2 tablets twice daily         . triamterene-hydrochlorothiazide (MAXZIDE-25) 37.5-25 MG per tablet   Oral   Take 1 tablet by mouth daily.          BP 108/46  Pulse 65  Temp(Src) 97.8 F (36.6 C) (Oral)  Resp 15  SpO2 98% Physical Exam  Nursing note and vitals reviewed. Constitutional: She is oriented to person, place, and time. She appears well-developed.  Frail, elderly  HENT:  Head: Normocephalic and atraumatic.  Eyes: Conjunctivae and EOM are normal. Pupils are equal, round, and reactive to light.  Neck: Normal range of motion and phonation normal. Neck supple.  Cardiovascular: Normal rate, regular rhythm and intact distal pulses.   Pulmonary/Chest: Effort normal and breath sounds normal. She exhibits no tenderness.  Abdominal: Soft. She exhibits no distension and no mass. There is no tenderness. There is no guarding.  Musculoskeletal: Normal range of motion.  Neurological: She is alert and oriented to person, place, and time. She has normal strength. She exhibits normal muscle tone.  Skin: Skin is warm and dry.  Psychiatric: She has a normal mood and affect. Her behavior is normal. Judgment and thought content normal.    ED Course   Procedures (including critical care time) Medications - No data to display  Patient Vitals for the past 24 hrs:  BP  Temp Temp src Pulse Resp SpO2  09/25/12 1554 108/46 mmHg 97.8 F (36.6 C) Oral 65 15 98 %  09/25/12 1545 96/80 mmHg - - 65 15 99 %  09/25/12 1530 130/53 mmHg - - 64 13 100 %  09/25/12 1515 141/57 mmHg - - 68 16 93 %  09/25/12 1500 100/66 mmHg - - 64 19 99 %  09/25/12 1445 114/49 mmHg - - 67 16 97 %  09/25/12 1415 128/48 mmHg - - 65 18 98 %  09/25/12 1400 131/55 mmHg - - 67 20 97 %  09/25/12 1345 121/55 mmHg - - 67 19 97 %  09/25/12 1333 122/45 mmHg - - 65 - -  09/25/12 1332 132/67 mmHg - - 65 - -  09/25/12 1330 122/45 mmHg - - 65 22 99 %  09/25/12 1315 114/52 mmHg - - 66 20 96 %  09/25/12 1245 121/60 mmHg - - 65 16 96 %  09/25/12 1230 116/61 mmHg - - 66 19 96 %  09/25/12 1215 127/50 mmHg - - 65 23 97 %  09/25/12 1200 130/50 mmHg - - 64 17 97 %  09/25/12 1145 142/59 mmHg - - 64 18 98 %  09/25/12 1134 127/51 mmHg 98.2 F (36.8 C) Oral 65 17 97 %  09/25/12 1129 - - - - - 97 %    1:27 PM Reevaluation with update and discussion. After initial assessment and treatment, an updated evaluation reveals no further complaints, she is now hungry. [name] L      Date: 09/25/12  Rate: 68  Rhythm: Electronic ventricular pacemaker  QRS Axis: Not applicable  PR and QT Intervals: Normal. QT  ST/T Wave abnormalities: normal  PR and QRS Conduction Disutrbances:Not applicable  Narrative Interpretation:   Old EKG Reviewed: unchanged   Labs Reviewed  URINALYSIS, ROUTINE W REFLEX MICROSCOPIC - Abnormal; Notable for the following:    APPearance HAZY (*)    Leukocytes, UA MODERATE (*)    All other components within normal limits  URINE MICROSCOPIC-ADD ON - Abnormal; Notable for the following:    Squamous Epithelial / LPF MANY (*)    Bacteria, UA FEW (*)    All other components within normal limits  CBC WITH DIFFERENTIAL - Abnormal; Notable for the following:    WBC 10.8 (*)    All other components within normal limits  BASIC METABOLIC PANEL - Abnormal; Notable for the following:     Sodium 128 (*)    Chloride 91 (*)  Glucose, Bld 149 (*)    BUN 27 (*)    GFR calc non Af Amer 46 (*)    GFR calc Af Amer 53 (*)    All other components within normal limits  URINE CULTURE    1. Dehydration   2. Weakness     MDM  Dehydration with likely cause being or nutritional intake. Patient is improved in ED, is comfortable, tolerating oral liquids and food, and is stable for discharge. Doubt metabolic instability, serious bacterial infection or impending vascular collapse; the patient is stable for discharge.     Nursing Notes Reviewed/ Care Coordinated, and agree without changes. Applicable Imaging Reviewed.  Interpretation of Laboratory Data incorporated into ED treatment   Plan: Home Medications- stop metoprolol; Home Treatments and Observation- rest, increase oral fluids; return here if the recommended treatment, does not improve the symptoms; Recommended follow up- Cards in 1 week to readress use of Metoprolol    Flint Melter, MD 09/25/12 [phone]

## 2012-09-25 NOTE — ED Notes (Signed)
Per ems, pt dizzy and weak x 3 days. Pt has pacemaker and regular paced per ems report. VS stable. History of diabetes.

## 2012-09-25 NOTE — ED Notes (Signed)
Pt said she could not stand for the ortho.v/s.

## 2012-09-26 LAB — URINE CULTURE

## 2012-10-12 ENCOUNTER — Emergency Department (HOSPITAL_COMMUNITY): Payer: Medicare Other

## 2012-10-12 ENCOUNTER — Encounter (HOSPITAL_COMMUNITY): Payer: Self-pay | Admitting: *Deleted

## 2012-10-12 ENCOUNTER — Inpatient Hospital Stay (HOSPITAL_COMMUNITY)
Admission: EM | Admit: 2012-10-12 | Discharge: 2012-10-16 | DRG: 292 | Disposition: A | Discharge status: Home Health | Payer: Medicare Other | Attending: Internal Medicine | Admitting: Internal Medicine

## 2012-10-12 DIAGNOSIS — I959 Hypotension, unspecified: Secondary | ICD-10-CM | POA: Diagnosis present

## 2012-10-12 DIAGNOSIS — R0789 Other chest pain: Secondary | ICD-10-CM

## 2012-10-12 DIAGNOSIS — I70269 Atherosclerosis of native arteries of extremities with gangrene, unspecified extremity: Secondary | ICD-10-CM

## 2012-10-12 DIAGNOSIS — M7989 Other specified soft tissue disorders: Secondary | ICD-10-CM

## 2012-10-12 DIAGNOSIS — E119 Type 2 diabetes mellitus without complications: Secondary | ICD-10-CM | POA: Diagnosis present

## 2012-10-12 DIAGNOSIS — I10 Essential (primary) hypertension: Secondary | ICD-10-CM | POA: Diagnosis present

## 2012-10-12 DIAGNOSIS — I5041 Acute combined systolic (congestive) and diastolic (congestive) heart failure: Principal | ICD-10-CM | POA: Diagnosis present

## 2012-10-12 DIAGNOSIS — E876 Hypokalemia: Secondary | ICD-10-CM | POA: Diagnosis present

## 2012-10-12 DIAGNOSIS — E785 Hyperlipidemia, unspecified: Secondary | ICD-10-CM | POA: Diagnosis present

## 2012-10-12 DIAGNOSIS — Z792 Long term (current) use of antibiotics: Secondary | ICD-10-CM

## 2012-10-12 DIAGNOSIS — I4891 Unspecified atrial fibrillation: Secondary | ICD-10-CM | POA: Diagnosis present

## 2012-10-12 DIAGNOSIS — E875 Hyperkalemia: Secondary | ICD-10-CM

## 2012-10-12 DIAGNOSIS — Z950 Presence of cardiac pacemaker: Secondary | ICD-10-CM

## 2012-10-12 DIAGNOSIS — I739 Peripheral vascular disease, unspecified: Secondary | ICD-10-CM | POA: Diagnosis present

## 2012-10-12 DIAGNOSIS — I509 Heart failure, unspecified: Secondary | ICD-10-CM | POA: Diagnosis present

## 2012-10-12 DIAGNOSIS — Z79899 Other long term (current) drug therapy: Secondary | ICD-10-CM

## 2012-10-12 DIAGNOSIS — R9431 Abnormal electrocardiogram [ECG] [EKG]: Secondary | ICD-10-CM

## 2012-10-12 DIAGNOSIS — E871 Hypo-osmolality and hyponatremia: Secondary | ICD-10-CM | POA: Diagnosis present

## 2012-10-12 LAB — COMPREHENSIVE METABOLIC PANEL
AST: 15 U/L (ref 0–37)
BUN: 15 mg/dL (ref 6–23)
CO2: 19 mEq/L (ref 19–32)
Calcium: 9.2 mg/dL (ref 8.4–10.5)
Creatinine, Ser: 1.07 mg/dL (ref 0.50–1.10)
GFR calc Af Amer: 51 mL/min — ABNORMAL LOW (ref >90)
GFR calc non Af Amer: 44 mL/min — ABNORMAL LOW (ref >90)
Glucose, Bld: 209 mg/dL — ABNORMAL HIGH (ref 70–99)
Total Bilirubin: 0.7 mg/dL (ref 0.3–1.2)

## 2012-10-12 LAB — PRO B NATRIURETIC PEPTIDE: Pro B Natriuretic peptide (BNP): 9840 pg/mL — ABNORMAL HIGH (ref 0–450)

## 2012-10-12 LAB — CBC WITH DIFFERENTIAL/PLATELET
Basophils Absolute: 0 10*3/uL (ref 0.0–0.1)
Basophils Relative: 0 % (ref 0–1)
Eosinophils Absolute: 0.1 10*3/uL (ref 0.0–0.7)
Eosinophils Relative: 1 % (ref 0–5)
HCT: 38.9 % (ref 36.0–46.0)
Hemoglobin: 13.8 g/dL (ref 12.0–15.0)
Lymphocytes Relative: 9 % — ABNORMAL LOW (ref 12–46)
Lymphs Abs: 1 10*3/uL (ref 0.7–4.0)
MCH: 31.6 pg (ref 26.0–34.0)
MCHC: 35.5 g/dL (ref 30.0–36.0)
MCV: 89 fL (ref 78.0–100.0)
Monocytes Absolute: 0.8 10*3/uL (ref 0.1–1.0)
Monocytes Relative: 7 % (ref 3–12)
Neutro Abs: 9.4 10*3/uL — ABNORMAL HIGH (ref 1.7–7.7)
Neutrophils Relative %: 83 % — ABNORMAL HIGH (ref 43–77)
Platelets: 323 10*3/uL (ref 150–400)
RBC: 4.37 MIL/uL (ref 3.87–5.11)
RDW: 13.6 % (ref 11.5–15.5)
WBC: 11.3 10*3/uL — ABNORMAL HIGH (ref 4.0–10.5)

## 2012-10-12 LAB — POCT I-STAT, CHEM 8
BUN: 17 mg/dL (ref 6–23)
Calcium, Ion: 1.06 mmol/L — ABNORMAL LOW (ref 1.13–1.30)
Creatinine, Ser: 1.1 mg/dL (ref 0.50–1.10)
Hemoglobin: 15.3 g/dL — ABNORMAL HIGH (ref 12.0–15.0)
Sodium: 126 mEq/L — ABNORMAL LOW (ref 135–145)
TCO2: 20 mmol/L (ref 0–100)

## 2012-10-12 LAB — URINE MICROSCOPIC-ADD ON

## 2012-10-12 LAB — POCT I-STAT TROPONIN I: Troponin i, poc: 0.06 ng/mL (ref 0.00–0.08)

## 2012-10-12 LAB — URINALYSIS, ROUTINE W REFLEX MICROSCOPIC
Bilirubin Urine: NEGATIVE
Glucose, UA: NEGATIVE mg/dL
Hgb urine dipstick: NEGATIVE
Ketones, ur: NEGATIVE mg/dL
Nitrite: NEGATIVE
Protein, ur: 30 mg/dL — AB
Specific Gravity, Urine: 1.02 (ref 1.005–1.030)
Urobilinogen, UA: 0.2 mg/dL (ref 0.0–1.0)
pH: 5.5 (ref 5.0–8.0)

## 2012-10-12 LAB — GLUCOSE, CAPILLARY
Glucose-Capillary: 248 mg/dL — ABNORMAL HIGH (ref 70–99)
Glucose-Capillary: 128 mg/dL — ABNORMAL HIGH (ref 70–99)

## 2012-10-12 MED ORDER — ALUM & MAG HYDROXIDE-SIMETH 200-200-20 MG/5ML PO SUSP: 30.0000 mL | Freq: Four times a day (QID) | ORAL | Status: DC | PRN

## 2012-10-12 MED ORDER — ENOXAPARIN SODIUM 40 MG/0.4ML ~~LOC~~ SOLN
40.0000 mg | SUBCUTANEOUS | Status: DC
  Administered 2012-10-12 – 2012-10-13 (×2): 40 mg via SUBCUTANEOUS
  Filled 2012-10-12 (×3): qty 0.4

## 2012-10-12 MED ORDER — SIMVASTATIN 40 MG PO TABS
40.0000 mg | ORAL_TABLET | Freq: Every evening | ORAL | Status: DC
  Administered 2012-10-12 – 2012-10-15 (×4): 40 mg via ORAL
  Filled 2012-10-12 (×5): qty 1

## 2012-10-12 MED ORDER — SODIUM CHLORIDE 0.9 % IJ SOLN: 3.0000 mL | INTRAMUSCULAR | Status: DC | PRN

## 2012-10-12 MED ORDER — ACETAMINOPHEN 325 MG PO TABS: 650.0000 mg | ORAL_TABLET | Freq: Four times a day (QID) | ORAL | Status: DC | PRN

## 2012-10-12 MED ORDER — ACETAMINOPHEN 650 MG RE SUPP: 650.0000 mg | Freq: Four times a day (QID) | RECTAL | Status: DC | PRN

## 2012-10-12 MED ORDER — SOTALOL HCL (AF) 80 MG PO TABS: 120.0000 mg | ORAL_TABLET | Freq: Two times a day (BID) | ORAL | Status: DC

## 2012-10-12 MED ORDER — ONDANSETRON HCL 4 MG PO TABS: 4.0000 mg | ORAL_TABLET | Freq: Four times a day (QID) | ORAL | Status: DC | PRN

## 2012-10-12 MED ORDER — SOTALOL HCL 120 MG PO TABS
120.0000 mg | ORAL_TABLET | Freq: Two times a day (BID) | ORAL | Status: DC
  Administered 2012-10-12 – 2012-10-13 (×3): 120 mg via ORAL
  Filled 2012-10-12 (×4): qty 1

## 2012-10-12 MED ORDER — LISINOPRIL 20 MG PO TABS
20.0000 mg | ORAL_TABLET | Freq: Every day | ORAL | Status: DC
  Administered 2012-10-13: 20 mg via ORAL
  Filled 2012-10-12 (×2): qty 1

## 2012-10-12 MED ORDER — FUROSEMIDE 10 MG/ML IJ SOLN: 60.0000 mg | Freq: Once | INTRAMUSCULAR | Status: DC

## 2012-10-12 MED ORDER — ASPIRIN EC 325 MG PO TBEC
325.0000 mg | DELAYED_RELEASE_TABLET | Freq: Every day | ORAL | Status: DC
  Administered 2012-10-12 – 2012-10-16 (×5): 325 mg via ORAL
  Filled 2012-10-12 (×5): qty 1

## 2012-10-12 MED ORDER — SODIUM CHLORIDE 0.9 % IJ SOLN
3.0000 mL | Freq: Two times a day (BID) | INTRAMUSCULAR | Status: DC
  Administered 2012-10-12 – 2012-10-16 (×6): 3 mL via INTRAVENOUS

## 2012-10-12 MED ORDER — FUROSEMIDE 10 MG/ML IJ SOLN
40.0000 mg | Freq: Once | INTRAMUSCULAR | Status: AC
  Administered 2012-10-12: 40 mg via INTRAVENOUS
  Filled 2012-10-12: qty 4

## 2012-10-12 MED ORDER — INSULIN ASPART 100 UNIT/ML ~~LOC~~ SOLN
0.0000 [IU] | Freq: Three times a day (TID) | SUBCUTANEOUS | Status: DC
  Administered 2012-10-13: 2 [IU] via SUBCUTANEOUS
  Administered 2012-10-13 – 2012-10-14 (×3): 1 [IU] via SUBCUTANEOUS
  Administered 2012-10-14 (×2): 2 [IU] via SUBCUTANEOUS
  Administered 2012-10-15: 1 [IU] via SUBCUTANEOUS
  Administered 2012-10-15: 5 [IU] via SUBCUTANEOUS
  Administered 2012-10-15: 1 [IU] via SUBCUTANEOUS
  Administered 2012-10-16: 2 [IU] via SUBCUTANEOUS
  Administered 2012-10-16: 1 [IU] via SUBCUTANEOUS

## 2012-10-12 MED ORDER — SODIUM POLYSTYRENE SULFONATE 15 GM/60ML PO SUSP
30.0000 g | Freq: Once | ORAL | Status: AC
  Administered 2012-10-12: 30 g via ORAL
  Filled 2012-10-12: qty 120

## 2012-10-12 MED ORDER — SODIUM CHLORIDE 0.9 % IV SOLN: 250.0000 mL | INTRAVENOUS | Status: DC | PRN

## 2012-10-12 MED ORDER — ALBUTEROL SULFATE (5 MG/ML) 0.5% IN NEBU: 2.5000 mg | INHALATION_SOLUTION | RESPIRATORY_TRACT | Status: DC | PRN

## 2012-10-12 MED ORDER — FUROSEMIDE 10 MG/ML IJ SOLN
40.0000 mg | Freq: Two times a day (BID) | INTRAMUSCULAR | Status: DC
  Administered 2012-10-12 – 2012-10-13 (×2): 40 mg via INTRAVENOUS
  Filled 2012-10-12 (×4): qty 4

## 2012-10-12 MED ORDER — SODIUM CHLORIDE 0.9 % IJ SOLN
3.0000 mL | Freq: Two times a day (BID) | INTRAMUSCULAR | Status: DC
  Administered 2012-10-12 – 2012-10-16 (×7): 3 mL via INTRAVENOUS

## 2012-10-12 MED ORDER — ONDANSETRON HCL 4 MG/2ML IJ SOLN: 4.0000 mg | Freq: Four times a day (QID) | INTRAMUSCULAR | Status: DC | PRN

## 2012-10-12 NOTE — ED Provider Notes (Signed)
CSN: [id number]     Arrival date & time 10/12/12  1016 History     First MD Initiated Contact with Patient 10/12/12 1022     Chief Complaint  Patient presents with  . Fatigue  . Shortness of Breath   (Consider location/radiation/quality/duration/timing/severity/associated sxs/prior Treatment) Patient is a 79 y.o. female presenting with shortness of breath.  Shortness of Breath Associated symptoms: chest pain   Associated symptoms: no abdominal pain, no cough, no fever, no headaches, no neck pain, no rash, no vomiting and no wheezing    Pt with several days of decreasing energy, increasing SOB with any exertion, nausea, decreased oral intake. Pt states she had 10 min of sharp central chest pain this morning which is currently resolved. Recently seen in ED for dehydration and had metoprolol held. Pt has persistent UTI for which she continue to be on abx. No cough, abd pain, vomiting. +occasional loose stools. Decreased urination. Mild bl lower ext swelling.  Past Medical History  Diagnosis Date  . Diabetes mellitus   . Hypertension   . Hyperlipidemia   . Arthritis   . AF (atrial fibrillation)   . Cataract     decreased vision in left eye  . Peripheral vascular disease    Past Surgical History  Procedure Laterality Date  . Pacemaker insertion  2008  . Cholecystectomy    . Thyroid surgery     No family history on file. History  Substance Use Topics  . Smoking status: Never Smoker   . Smokeless tobacco: Never Used  . Alcohol Use: No   OB History   Grav Para Term Preterm Abortions TAB SAB Ect Mult Living                 Review of Systems  Constitutional: Positive for fatigue. Negative for fever and chills.  HENT: Negative for neck pain.   Respiratory: Positive for shortness of breath. Negative for cough, chest tightness and wheezing.   Cardiovascular: Positive for chest pain and leg swelling. Negative for palpitations.  Gastrointestinal: Positive for nausea and diarrhea.  Negative for vomiting, abdominal pain, constipation and blood in stool.  Genitourinary: Negative for dysuria, frequency, hematuria, flank pain and difficulty urinating.  Musculoskeletal: Negative for myalgias, back pain, joint swelling, arthralgias and gait problem.  Skin: Negative for rash and wound.  Neurological: Positive for weakness (generalized). Negative for dizziness, light-headedness, numbness and headaches.    Allergies  Nitrofurantoin and Sulfa antibiotics  Home Medications   Current Outpatient Rx  Name  Route  Sig  Dispense  Refill  . aspirin 81 MG tablet   Oral   Take 160 mg by mouth daily.         . beta carotene w/minerals (OCUVITE) tablet   Oral   Take 1 tablet by mouth daily.         . cephALEXin (KEFLEX) 500 MG capsule   Oral   Take 500 mg by mouth 2 (two) times daily.         . cyanocobalamin 500 MCG tablet   Oral   Take 500 mcg by mouth every morning.         Marland Kitchen lisinopril (PRINIVIL,ZESTRIL) 40 MG tablet   Oral   Take 40 mg by mouth daily.         . metFORMIN (GLUCOPHAGE-XR) 500 MG 24 hr tablet   Oral   Take 500 mg by mouth 3 (three) times daily.          . ondansetron (ZOFRAN)  4 MG tablet   Oral   Take 4 mg by mouth every 8 (eight) hours as needed for nausea.         . simvastatin (ZOCOR) 40 MG tablet   Oral   Take 40 mg by mouth every evening.         . sodium chloride 1 G tablet   Oral   Take 1 g by mouth daily.         Marland Kitchen SOTALOL AF 80 MG TABS   Oral   Take 120 mg by mouth 2 (two) times daily. One and 1/2 tablets twice daily         . triamterene-hydrochlorothiazide (MAXZIDE-25) 37.5-25 MG per tablet   Oral   Take 1 tablet by mouth daily.          BP 124/44  Pulse 68  Temp(Src) 97.4 F (36.3 C) (Oral)  Resp 20  SpO2 100% Physical Exam  Nursing note and vitals reviewed. Constitutional: She is oriented to person, place, and time. She appears well-developed and well-nourished. No distress.  HENT:  Head:  Normocephalic and atraumatic.  Mouth/Throat: Oropharynx is clear and moist.  Eyes: EOM are normal. Pupils are equal, round, and reactive to light.  Neck: Normal range of motion. Neck supple.  Cardiovascular: Normal rate and regular rhythm.   Pulmonary/Chest: Effort normal. No respiratory distress. She has no wheezes. She has rales (mild crackle in bl bases).  Abdominal: Soft. Bowel sounds are normal. She exhibits no distension and no mass. There is no tenderness. There is no rebound and no guarding.  Musculoskeletal: Normal range of motion. She exhibits no edema and no tenderness.  No calf swelling or pain  Neurological: She is alert and oriented to person, place, and time.  5/5 motor in all ext, sensation grossly intact  Skin: Skin is warm and dry. No rash noted. No erythema.  Psychiatric: She has a normal mood and affect. Her behavior is normal.    ED Course   Procedures (including critical care time)  Labs Reviewed  GLUCOSE, CAPILLARY - Abnormal; Notable for the following:    Glucose-Capillary 248 (*)    All other components within normal limits  POCT I-STAT, CHEM 8 - Abnormal; Notable for the following:    Sodium 126 (*)    Potassium 5.3 (*)    Chloride 94 (*)    Glucose, Bld 220 (*)    Calcium, Ion 1.06 (*)    Hemoglobin 15.3 (*)    All other components within normal limits  COMPREHENSIVE METABOLIC PANEL  URINALYSIS, ROUTINE W REFLEX MICROSCOPIC  CBC WITH DIFFERENTIAL  PRO B NATRIURETIC PEPTIDE  POCT I-STAT TROPONIN I   No results found. No diagnosis found.  Date: 10/12/2012  Rate: 65  Rhythm: ventricular paced rhythm  QRS Axis: indeterminate  Intervals: QRS prolonged  ST/T Wave abnormalities: indeterminate  Conduction Disutrbances:none  Narrative Interpretation:   Old EKG Reviewed: unchanged   MDM  Discussed with [facility] who will admit.   Loren Racer, MD 10/12/12 1257

## 2012-10-12 NOTE — Progress Notes (Signed)
Patient complained of foley catheter feeling uncomfortable. Karle Barr RN removed foley catheter.

## 2012-10-12 NOTE — ED Notes (Signed)
Dr. Ghimire at bedside 

## 2012-10-12 NOTE — H&P (Signed)
PATIENT DETAILS Name: [name] Age: 79 y.o. Sex: female Date of Birth: Dec 29, 1918 Admit Date: 10/12/2012 WUJ:WJXB, CAMMIE, MD   CHIEF COMPLAINT:  Had exertional dyspnea for the past 3-5 days  HPI: [name] is a 79 y.o. female with a Past Medical History of sick sinus syndrome status post permanent pacemaker placement, atrial fibrillation, hypertension who presents today with the above noted complaint. Per patient, she has not been her usual self for the past 2 weeks, on 7/30 she presented to the emergency room for "weakness", she was evaluated and thought to be dehydrated, she received some fluids and then was discharged.For the past 5-7 days, patient has had dyspnea, exclusively on exertion, this has slowly worsened. For the past 2 days even simply walking to the bathroom makes her short of breath. She also has noticed worsening bilateral lower extremity edema. She presented to the emergency room with these complaints, she was found to have worsening of her hyponatremia and a significantly elevated BNP level. Her symptoms were thought to be secondary to new onset CHF, I was subsequently asked to admit this patient for further evaluation and treatment. During my evaluation, patient had already received IV furosemide, and was feeling somewhat better. She was not in any distress, she did not exhibit any shortness of breath at rest, was speaking in full sentences. She is now being admitted for further evaluation and treatment. She denied any chest pain, headaches, nausea, vomiting, or diarrhea.  ALLERGIES:   Allergies  Allergen Reactions  . Nitrofurantoin Nausea And Vomiting  . Sulfa Antibiotics Hives    PAST MEDICAL HISTORY: Past Medical History  Diagnosis Date  . Diabetes mellitus   . Hypertension   . Hyperlipidemia   . Arthritis   . AF (atrial fibrillation)   . Cataract     decreased vision in left eye  . Peripheral vascular disease     PAST SURGICAL HISTORY: Past  Surgical History  Procedure Laterality Date  . Pacemaker insertion  2008  . Cholecystectomy    . Thyroid surgery      MEDICATIONS AT HOME: Prior to Admission medications   Medication Sig Start Date End Date Taking? Authorizing Provider  aspirin 81 MG tablet Take 160 mg by mouth daily.   Yes Historical Provider, MD  beta carotene w/minerals (OCUVITE) tablet Take 1 tablet by mouth daily.   Yes Historical Provider, MD  cephALEXin (KEFLEX) 500 MG capsule Take 500 mg by mouth 2 (two) times daily.   Yes Historical Provider, MD  cyanocobalamin 500 MCG tablet Take 500 mcg by mouth every morning.   Yes Historical Provider, MD  lisinopril (PRINIVIL,ZESTRIL) 40 MG tablet Take 40 mg by mouth daily.   Yes Historical Provider, MD  metFORMIN (GLUCOPHAGE-XR) 500 MG 24 hr tablet Take 500 mg by mouth 3 (three) times daily.    Yes Historical Provider, MD  ondansetron (ZOFRAN) 4 MG tablet Take 4 mg by mouth every 8 (eight) hours as needed for nausea.   Yes Historical Provider, MD  simvastatin (ZOCOR) 40 MG tablet Take 40 mg by mouth every evening.   Yes Historical Provider, MD  sodium chloride 1 G tablet Take 1 g by mouth daily.   Yes Historical Provider, MD  SOTALOL AF 80 MG TABS Take 120 mg by mouth 2 (two) times daily. One and 1/2 tablets twice daily   Yes Historical Provider, MD  triamterene-hydrochlorothiazide (MAXZIDE-25) 37.5-25 MG per tablet Take 1 tablet by mouth daily.   Yes Historical Provider, MD  FAMILY HISTORY: No family history on file.  SOCIAL HISTORY:  reports that she has never smoked. She has never used smokeless tobacco. She reports that she does not drink alcohol or use illicit drugs.  REVIEW OF SYSTEMS:  Constitutional:   No  weight loss, night sweats,  Fevers, chills, fatigue.  HEENT:    No headaches, Difficulty swallowing,Tooth/dental problems,Sore throat,  No sneezing, itching, ear ache, nasal congestion, post nasal drip,   Cardio-vascular: No chest pain,  Orthopnea, PND,  anasarca, dizziness, palpitations  GI:  No heartburn, indigestion, abdominal pain, nausea, vomiting, diarrhea, change in bowel habits, loss of appetite  Resp: No shortness of breath r at rest.  No excess mucus, no productive cough, No non-productive cough,  No coughing up of blood.No change in color of mucus.No wheezing.No chest wall deformity  Skin:  no rash or lesions.  GU:  no dysuria, change in color of urine, no urgency or frequency.  No flank pain.  Musculoskeletal: No joint pain or swelling.  No decreased range of motion.  No back pain.  Psych: No change in mood or affect. No depression or anxiety.  No memory loss.   PHYSICAL EXAM: Blood pressure 124/65, pulse 66, temperature 97.5 F (36.4 C), temperature source Oral, resp. rate 23, SpO2 100.00%.  General appearance :Awake, alert, not in any distress. Speech Clear. Not toxic Looking HEENT: Atraumatic and Normocephalic, pupils equally reactive to light and accomodation Neck: supple, no JVD. No cervical lymphadenopathy.  Chest:Good air entry bilaterally, bibasilar rales  CVS: S1 S2 regular, no murmurs.  Abdomen: Bowel sounds present, Non tender and not distended with no gaurding, rigidity or rebound. Extremities: B/L Lower Ext shows 1-2+ edema, both legs are warm to touch Neurology: Awake alert, and oriented X 3, CN II-XII intact, Non focal Skin:No Rash Wounds:N/A  LABS ON ADMISSION:   Recent Labs  10/12/12 1051 10/12/12 1054  NA 123* 126*  K 5.3* 5.3*  CL 90* 94*  CO2 19  --   GLUCOSE 209* 220*  BUN 15 17  CREATININE 1.07 1.10  CALCIUM 9.2  --     Recent Labs  10/12/12 1051  AST 15  ALT 10  ALKPHOS 52  BILITOT 0.7  PROT 5.9*  ALBUMIN 3.3*   No results found for this basename: LIPASE, AMYLASE,  in the last 72 hours  Recent Labs  10/12/12 1051 10/12/12 1054  WBC 11.3*  --   NEUTROABS 9.4*  --   HGB 13.8 15.3*  HCT 38.9 45.0  MCV 89.0  --   PLT 323  --    No results found for this  basename: CKTOTAL, CKMB, CKMBINDEX, TROPONINI,  in the last 72 hours No results found for this basename: DDIMER,  in the last 72 hours No components found with this basename: POCBNP,    RADIOLOGIC STUDIES ON ADMISSION: Dg Chest 2 View  10/12/2012   CLINICAL DATA:  Shortness of Breath. Fatigue.  EXAM: CHEST  2 VIEW  COMPARISON:  03/17/2007  FINDINGS: New bibasilar pulmonary opacity is seen which may be due to atelectasis or infiltrates. Small pleural effusions are also new. Heart size is within normal limits. Dual lead transvenous pacemaker remains in appropriate position.  IMPRESSION: New bibasilar atelectasis versus infiltrates and small pleural effusions.   Electronically Signed   By: Myles Rosenthal   On: 10/12/2012 12:05   EKG: Independently reviewed. Paced rhythm  ASSESSMENT AND PLAN: Present on Admission:  . CHF, acute - Suspected new systolic heart failure with acute  decompensation  - Was admit to telemetry, started on IV furosemide, continue with lisinopril and sotalol.  - Daily weights, strict intake and outputs, will check a 2-D echocardiogram as well  - May need cardiology consultation at some point.   . Hyponatremia - Hypervolemic hyponatremia likely given excess volume on exam, however she is also on hydrochlorothiazide which may have been contributing.  - Start diuretics, and follow electrolytes.   . A-fib - History of chronic atrial fibrillation maintained on sotalol. Has a permanent pacemaker in place  - Given advanced age, doubt a anticoagulation candidate.  - Follow and monitor on telemetry   . Diabetes - Hold the metformin, and place on SSI   . Hyperlipidemia - Continue statin   Further plan will depend as patient's clinical course evolves and further radiologic and laboratory data become available. Patient will be monitored closely.   DVT Prophylaxis: Prophylactic Lovenox   Code Status: DNI  Total time spent for admission equals 45  minutes.  [facility] Pager [phone]  If 7PM-7AM, please contact night-coverage [email or web address] Password [facility] 10/12/2012, 1:29 PM

## 2012-10-12 NOTE — ED Notes (Addendum)
PT called EMS for generalized weakness.  States being tx by pcp for uti and low sodium since Wed.  States felt weak, sob and chest pain while walking to the bathroom.  Pt has felt sob with activity for the past 4 days, and almost 2 weak to ambulate since this am.  Unable to eat solid food for several days d/t nausea, but has been able to keep down orange juice.  CBG 228 per ems.  Pt also c/o diarrhea.

## 2012-10-12 NOTE — ED Notes (Signed)
CBG 248  

## 2012-10-13 DIAGNOSIS — I4891 Unspecified atrial fibrillation: Secondary | ICD-10-CM

## 2012-10-13 LAB — BASIC METABOLIC PANEL
BUN: 14 mg/dL (ref 6–23)
Chloride: 92 mEq/L — ABNORMAL LOW (ref 96–112)
Glucose, Bld: 127 mg/dL — ABNORMAL HIGH (ref 70–99)
Potassium: 3.3 mEq/L — ABNORMAL LOW (ref 3.5–5.1)

## 2012-10-13 LAB — GLUCOSE, CAPILLARY
Glucose-Capillary: 145 mg/dL — ABNORMAL HIGH (ref 70–99)
Glucose-Capillary: 149 mg/dL — ABNORMAL HIGH (ref 70–99)

## 2012-10-13 LAB — CBC
HCT: 37.5 % (ref 36.0–46.0)
Hemoglobin: 12.9 g/dL (ref 12.0–15.0)
MCV: 89.5 fL (ref 78.0–100.0)
WBC: 9.7 10*3/uL (ref 4.0–10.5)

## 2012-10-13 LAB — CLOSTRIDIUM DIFFICILE BY PCR: Toxigenic C. Difficile by PCR: NEGATIVE

## 2012-10-13 MED ORDER — POTASSIUM CHLORIDE CRYS ER 20 MEQ PO TBCR
40.0000 meq | EXTENDED_RELEASE_TABLET | Freq: Once | ORAL | Status: AC
  Administered 2012-10-13: 40 meq via ORAL
  Filled 2012-10-13: qty 2

## 2012-10-13 MED ORDER — LOPERAMIDE HCL 2 MG PO CAPS: 2.0000 mg | ORAL_CAPSULE | Freq: Four times a day (QID) | ORAL | Status: DC | PRN

## 2012-10-13 MED ORDER — FUROSEMIDE 10 MG/ML IJ SOLN
40.0000 mg | Freq: Every day | INTRAMUSCULAR | Status: DC
  Filled 2012-10-13: qty 4

## 2012-10-13 NOTE — Progress Notes (Signed)
Clinical Social Work Department BRIEF PSYCHOSOCIAL ASSESSMENT 10/13/2012  Patient:  [facility]     Account Number:  [id number]     Admit date:  10/12/2012  Clinical Social Worker:  Hadley Pen  Date/Time:  10/13/2012 03:42 PM  Referred by:  Physician  Date Referred:  10/13/2012 Referred for  SNF Placement   Other Referral:   Interview type:  Patient Other interview type:   Weekend CSW spoke with patient's son Darrel [phone]) & daughter Lynden Ang [phone]) who were at the bedside.    PSYCHOSOCIAL DATA Living Status:  ALONE Admitted from facility:   Level of care:   Primary support name:  Darrel Primary support relationship to patient:  CHILD, ADULT Degree of support available:   Good    CURRENT CONCERNS Current Concerns  Post-Acute Placement   Other Concerns:    SOCIAL WORK ASSESSMENT / PLAN Weekend CSW met with patient and patient's son Darrel & daughter Lynden Ang to discuss plans after discharge. CSW discussed benefits of short term rehab, as well as the process of faxing out for bed offers. Family and patient were receptive to information.  Weekend CSW to fax out for bed offers in [place] weekday CSW to follow for d/c planning.   Assessment/plan status:  Information/Referral to [facility] Other assessment/ plan:   Information/referral to community resources:   Weekend CSW faxed out patient info for bed offers in [employer].    PATIENT'S/FAMILY'S RESPONSE TO PLAN OF CARE: Nolon Bussing prefers [facility] due to it's close proximity to his residence, Lynden Ang is in town visiting from [place]. Patient stated that she did not want to be in rehab for more than a week but was agreeable to [facility].    Samuella Bruin, LCSWA [facility]. [phone]

## 2012-10-13 NOTE — Progress Notes (Signed)
Patient ID: [name], female   DOB: 1918/12/17, 79 y.o.   MRN: [id number]   CARDIOLOGY CONSULT NOTE  Patient ID: [name] MRN: [id number], DOB/AGE: [birth year]   Admit date: 10/12/2012 Date of Consult: 10/13/2012   Primary Physician: [name], MD Primary Cardiologist: Everette Rank  Pt. Profile 79 year old white female admitted with new-onset congestive heart failure. I was asked by Dr. Thedore Mins to consult.   Problem List  Past Medical History  Diagnosis Date  . Diabetes mellitus   . Hypertension   . Hyperlipidemia   . Arthritis   . AF (atrial fibrillation)   . Cataract     decreased vision in left eye  . Peripheral vascular disease     Past Surgical History  Procedure Laterality Date  . Pacemaker insertion  2008  . Cholecystectomy    . Thyroid surgery       Allergies  Allergies  Allergen Reactions  . Nitrofurantoin Nausea And Vomiting  . Sulfa Antibiotics Hives    HPI   Very pleasant mentally sharp 79 year old white female with about a three-week history of dyspnea on exertion and swelling in her lower extremities. She's been on a mild diuretic in the past but not on admission. He was seen in emergency room recently and felt to be dehydrated. She increased her water intake poor today and became hyponatremic. Her primary care physician placed her on salt tablets. She is now admitted with new onset congestive heart failure.  His history of atrial fibrillation and sick sinus syndrome and has a permanent pacer in place. She also has a history of hypertension, hyperlipidemia, and peripheral vascular disease. She still lives alone and is quite independent. Daughter from [place] state is here and is very helpful. She also has a son who lives here.  She's also had problems with recurrent urine tract infections and has been on chronic antibiotic suppressive therapy in the past by [facility]. She's also had a fair amount of nausea and diarrhea in her  family is very worried that she is very deconditioned and needs rehabilitation.  Inpatient Medications  . aspirin EC  325 mg Oral Daily  . enoxaparin (LOVENOX) injection  40 mg Subcutaneous Q24H  . [START ON 10/14/2012] furosemide  40 mg Intravenous Daily  . insulin aspart  0-9 Units Subcutaneous TID WC  . lisinopril  20 mg Oral Daily  . potassium chloride  40 mEq Oral Once  . simvastatin  40 mg Oral QPM  . sodium chloride  3 mL Intravenous Q12H  . sodium chloride  3 mL Intravenous Q12H  . sotalol  120 mg Oral Q12H    Family History History reviewed. No pertinent family history.   Social History see above in history of present illness. She is widowed. Married for 68 years.   Review of Systems  General:  No chills, fever, night sweats or weight changes.  Cardiovascular:  No chest pain,  edema, orthopnea, palpitations, paroxysmal nocturnal dyspnea. Dermatological: No rash, lesions/masses Respiratory: No cough, dyspnea Urologic: No hematuria, dysuria Abdominal:   No nausea, vomiting, diarrhea, bright red blood per rectum, melena, or hematemesis Neurologic:  No visual changes, wkns, changes in mental status. All other systems reviewed and are otherwise negative except as noted above.  Physical Exam  Blood pressure 122/50, pulse 65, temperature 98 F (36.7 C), temperature source Oral, resp. rate 18, height 5\' 2"  (1.575 m), weight 202 lb (91.627 kg), SpO2 100.00%.  General: Pleasant, NAD, obese Psych: Normal affect. Neuro: Alert and oriented  X 3. Moves all extremities spontaneously. HEENT: Normal  Neck: Supple without bruits or JVD. Lungs:  Resp regular and unlabored, CTA. Decreased breath sounds in the left base Heart: RRR no s3, s4, or murmurs. Abdomen: Soft, non-tender, non-distended, BS + x 4.  Extremities: No clubbing, cyanosis, 1+ pitting edema bilaterally DP/PT/Radials 2+ and equal bilaterally.  Labs   Recent Labs  10/12/12 1742 10/12/12 2242 10/13/12 0350    TROPONINI <0.30 <0.30 <0.30   Lab Results  Component Value Date   WBC 9.7 10/13/2012   HGB 12.9 10/13/2012   HCT 37.5 10/13/2012   MCV 89.5 10/13/2012   PLT 285 10/13/2012    Recent Labs Lab 10/12/12 1051  10/13/12 0350  NA 123*  < > 131*  K 5.3*  < > 3.3*  CL 90*  < > 92*  CO2 19  --  24  BUN 15  < > 14  CREATININE 1.07  < > 0.95  CALCIUM 9.2  --  8.7  PROT 5.9*  --   --   BILITOT 0.7  --   --   ALKPHOS 52  --   --   ALT 10  --   --   AST 15  --   --   GLUCOSE 209*  < > 127*  < > = values in this interval not displayed. No results found for this basename: CHOL, HDL, LDLCALC, TRIG   No results found for this basename: DDIMER    Radiology/Studies  Dg Chest 2 View  10/12/2012   CLINICAL DATA:  Shortness of Breath. Fatigue.  EXAM: CHEST  2 VIEW  COMPARISON:  03/17/2007  FINDINGS: New bibasilar pulmonary opacity is seen which may be due to atelectasis or infiltrates. Small pleural effusions are also new. Heart size is within normal limits. Dual lead transvenous pacemaker remains in appropriate position.  IMPRESSION: New bibasilar atelectasis versus infiltrates and small pleural effusions.   Electronically Signed   By: Myles Rosenthal   On: 10/12/2012 12:05    ECG    ASSESSMENT AND PLAN  New onset congestive heart failure. With her age and hypertension, and overall normal heart size on chest x-ray, I suspect this is acute diastolic heart failure. She clearly needs daily diuretic at home which needs to be determined by Dr. Eldridge Dace. Awaiting echo this afternoon. Continue lisinopril. Supplement potassium.  She also needs a urinalysis which I will order. She most likely needs chronic suppressive therapy.  I will put a consult in for care management. She may need rehabilitation prior to going home. I've asked the daughter to be here to work with them tomorrow.   Signed, Valera Castle, MD 10/13/2012, 12:26 PM

## 2012-10-13 NOTE — Progress Notes (Signed)
Pt tolerating meals today with no nausea. Apetite improved. Only small amt loose stool today, neg for c_diff.

## 2012-10-13 NOTE — Evaluation (Signed)
Physical Therapy Evaluation Patient Details Name: [name] MRN: [id number] DOB: Dec 30, 1918 Today's Date: 10/13/2012 Time: 3086-5784 PT Time Calculation (min): 19 min  PT Assessment / Plan / Recommendation History of Present Illness  79 y.o. female with a Past Medical History of sick sinus syndrome status post permanent pacemaker placement, atrial fibrillation, hypertension who presents today with the above noted complaint. Per patient, she has not been her usual self for the past 2 weeks, on 7/30 she presented to the emergency room for "weakness", she was evaluated and thought to be dehydrated, she received some fluids and then was discharged.For the past 5-7 days, patient has had dyspnea, exclusively on exertion, this has slowly worsened. For the past 2 days even simply walking to the bathroom makes her short of breath. She also has noticed worsening bilateral lower extremity edema. She presented to the emergency room with these complaints, she was found to have worsening of her hyponatremia and a significantly elevated BNP level. Her symptoms were thought to be secondary to new onset CHF, I was subsequently asked to admit this patient for further evaluation and treatment.  Clinical Impression  Pt with generalized weakness and decreased activity tolerance. Pt with limited mobility PTA however was able to amb with rollator, bath and complete light meal prep mod I. If 24/7 supervision can not be provided pt to strongly benefit from ST-SNF placement to achieve safe mod I function prior to transitioning home alone.    PT Assessment  Patient needs continued PT services    Follow Up Recommendations  Home health PT;Supervision/Assistance - 24 hour;SNF    Does the patient have the potential to tolerate intense rehabilitation      Barriers to Discharge Decreased caregiver support pt daughter going to stay for a little while however pt lives alone    Equipment Recommendations  None  recommended by PT    Recommendations for Other Services     Frequency Min 3X/week    Precautions / Restrictions Precautions Precautions: Fall Restrictions Weight Bearing Restrictions: No   Pertinent Vitals/Pain Denies pain     Mobility  Bed Mobility Bed Mobility: Not assessed Transfers Transfers: Sit to Stand;Stand to Sit Sit to Stand: 3: Mod assist;With upper extremity assist;From chair/3-in-1 Stand to Sit: 4: Min assist;With upper extremity assist;To chair/3-in-1 Details for Transfer Assistance: assist for initiate anterior weight-shift and to initiate upright position Ambulation/Gait Ambulation/Gait Assistance: 4: Min assist Ambulation Distance (Feet): 40 Feet Assistive device: Rolling walker Ambulation/Gait Assistance Details: increased trunk flexion, short shuffled steps Gait Pattern: Step-to pattern;Shuffle Gait velocity: slow Stairs: No    Exercises     PT Diagnosis: Generalized weakness  PT Problem List: Decreased strength;Decreased activity tolerance;Decreased balance;Decreased mobility PT Treatment Interventions: DME instruction;Gait training;Stair training;Functional mobility training;Therapeutic activities;Therapeutic exercise     PT Goals(Current goals can be found in the care plan section) Acute Rehab PT Goals Patient Stated Goal: didn't state PT Goal Formulation: With patient/family Time For Goal Achievement: 10/20/12 Potential to Achieve Goals: Good  Visit Information  Last PT Received On: 10/13/12 Assistance Needed: +1 History of Present Illness: 79 y.o. female with a Past Medical History of sick sinus syndrome status post permanent pacemaker placement, atrial fibrillation, hypertension who presents today with the above noted complaint. Per patient, she has not been her usual self for the past 2 weeks, on 7/30 she presented to the emergency room for "weakness", she was evaluated and thought to be dehydrated, she received some fluids and then was  discharged.For the past 5-7  days, patient has had dyspnea, exclusively on exertion, this has slowly worsened. For the past 2 days even simply walking to the bathroom makes her short of breath. She also has noticed worsening bilateral lower extremity edema. She presented to the emergency room with these complaints, she was found to have worsening of her hyponatremia and a significantly elevated BNP level. Her symptoms were thought to be secondary to new onset CHF, I was subsequently asked to admit this patient for further evaluation and treatment.       Prior Functioning  Home Living Family/patient expects to be discharged to:: Private residence Living Arrangements: Alone Available Help at Discharge: Family;Available PRN/intermittently Type of Home: House Home Access:  (threshhold step - pt doesn't leave home alone) Home Layout: One level Home Equipment: Walker - 4 wheels;Tub bench Additional Comments: someone accompanies pt when leaving home Prior Function Level of Independence: Independent with assistive device(s);Needs assistance Gait / Transfers Assistance Needed: uses rollator in home, limited outside amb - someones accompanies pt ADL's / Homemaking Assistance Needed: son does grocery shopping, pt able to do light meals Communication / Swallowing Assistance Needed: no Communication Communication: No difficulties Dominant Hand: Right    Cognition  Cognition Arousal/Alertness: Awake/alert Behavior During Therapy: WFL for tasks assessed/performed Overall Cognitive Status: Within Functional Limits for tasks assessed    Extremity/Trunk Assessment Upper Extremity Assessment Upper Extremity Assessment: Generalized weakness Lower Extremity Assessment Lower Extremity Assessment: Generalized weakness Cervical / Trunk Assessment Cervical / Trunk Assessment: Normal   Balance Balance Balance Assessed: Yes Static Standing Balance Static Standing - Balance Support: Bilateral upper extremity  supported Static Standing - Level of Assistance: 4: Min assist Static Standing - Comment/# of Minutes: pt stood x 3 min for hygiene s/p incontinence of urine and to don pad with mesh panties  End of Session PT - End of Session Equipment Utilized During Treatment: Gait belt Activity Tolerance: Patient tolerated treatment well Patient left: in chair;with call bell/phone within reach;with family/visitor present;with nursing/sitter in room Nurse Communication: Mobility status  GP     Marcene Brawn 10/13/2012, 4:38 PM  Lewis Shock, PT, DPT Pager #: [phone] Office #: [phone]

## 2012-10-13 NOTE — Progress Notes (Signed)
Clinical Social Work Department CLINICAL SOCIAL WORK PLACEMENT NOTE 10/13/2012  Patient:  [facility]  Account Number:  [id number] Admit date:  10/12/2012  Clinical Social Worker:  Samuella Bruin, Theresia Majors  Date/time:  10/13/2012 03:50 PM  Clinical Social Work is seeking post-discharge placement for this patient at the following level of care:   SKILLED NURSING   (*CSW will update this form in Epic as items are completed)     Patient/family provided with [facility] Department of Clinical Social Work's list of facilities offering this level of care within the geographic area requested by the patient (or if unable, by the patient's family).  10/13/2012  Patient/family informed of their freedom to choose among providers that offer the needed level of care, that participate in Medicare, Medicaid or managed care program needed by the patient, have an available bed and are willing to accept the patient.    Patient/family informed of [facility]' ownership interest in [facility], as well as of the fact that they are under no obligation to receive care at this facility.  PASARR submitted to EDS on 10/13/2012 PASARR number received from EDS on 10/13/2012  FL2 transmitted to all facilities in geographic area requested by pt/family on  10/13/2012 FL2 transmitted to all facilities within larger geographic area on   Patient informed that his/her managed care company has contracts with or will negotiate with  certain facilities, including the following:     Patient/family informed of bed offers received:   Patient chooses bed at  Physician recommends and patient chooses bed at    Patient to be transferred to  on   Patient to be transferred to facility by   The following physician request were entered in Epic:   Additional Comments:   Belenda Cruise Elsie Baynes, [facility] [phone]

## 2012-10-13 NOTE — Progress Notes (Signed)
[facility]                                                                                Patient Demographics  [name], is a 79 y.o. female, DOB - Apr 13, 1918, ZOX:[id number], WJX:[id number]  Admit date - 10/12/2012  Admitting Physician Dewayne Shorter Levora Dredge, MD  Outpatient Primary MD for the patient is FULP, CAMMIE, MD  LOS - 1   Chief Complaint  Patient presents with  . Fatigue  . Shortness of Breath        Assessment & Plan    . CHF, acute on chronic likely diastolic  She was recently diagnosed with UTI and was asked to drink plenty of fluids which she was doing, has likely put her in fluid overload. Troponin is negative, she feels much better after IV Lasix, now close to compensated, will check echogram, have discussed her care with Dr. Elijah Birk wall cardiologist on call. If echo is stable and she looks better tomorrow will place her on small dose Lasix with fluid and salt restriction and discharge home with close followup with primary cardiologist Dr.Varanasi. She is on sotalol and ACE inhibitor which will be continued.    . Hyponatremia  -Tended to volume overload improving with diuresis. Lasix at a lower dose continued.    . A-fib (?sick sinus)  - History of chronic atrial fibrillation maintained on sotalol. Has a permanent pacemaker in place  - Given advanced age, doubt a anticoagulation candidate.  Follow and monitor on telemetry , currently stable will follow with primary cardiologist Dr. Eldridge Dace post discharge.    . Diabetes  - Hold the metformin, and place on SSI   CBG (last 3)   Recent Labs  10/12/12 1655 10/12/12 2108 10/13/12 0706  GLUCAP 128* 122* 123*    Lab Results  Component Value Date   HGBA1C  Value: 6.7 (NOTE)   The ADA recommends the following therapeutic goals for glycemic   control related to Hgb A1C measurement:   Goal of Therapy:   < 7.0% Hgb A1C   Action Suggested:  > 8.0% Hgb A1C   Ref:  Diabetes Care, 22, Suppl. 1, 1999*  08/07/2006     . Hyperlipidemia  - Continue statin    Recent UTI.  That is actually treated, current UA is clean.  Code Status: [place]  Family Communication: none present  Disposition Plan: Home   Procedures Echo   Consults  Dr Daleen Squibb cards over the Phone   DVT Prophylaxis  Lovenox   Lab Results  Component Value Date   PLT 285 10/13/2012    Medications  Scheduled Meds: . aspirin EC  325 mg Oral Daily  . enoxaparin (LOVENOX) injection  40 mg Subcutaneous Q24H  . [START ON 10/14/2012] furosemide  40 mg Intravenous Daily  . insulin aspart  0-9 Units Subcutaneous TID WC  . lisinopril  20 mg Oral Daily  . potassium chloride  40 mEq Oral Once  . simvastatin  40 mg Oral QPM  . sodium chloride  3 mL Intravenous Q12H  . sodium chloride  3 mL Intravenous Q12H  . sotalol  120 mg Oral Q12H   Continuous Infusions:  PRN Meds:.sodium  chloride, acetaminophen, acetaminophen, albuterol, alum & mag hydroxide-simeth, ondansetron (ZOFRAN) IV, ondansetron, sodium chloride  Antibiotics    Anti-infectives   None       Time Spent in minutes  35   Susa Raring K M.D on 10/13/2012 at 10:04 AM  Between 7am to 7pm - Pager - [phone]  After 7pm go to [email or web address] - password [email or web address]  And look for the night coverage person covering for me after hours  [facility] Office  [phone]    Subjective:   [name] today has, No headache, No chest pain, No abdominal pain - No Nausea, No new weakness tingling or numbness, No Cough - SOB.   Objective:   Filed Vitals:   10/12/12 1500 10/12/12 1700 10/12/12 2019 10/13/12 0624  BP: 119/53 121/48 109/59 122/50  Pulse: 65 65 69 65  Temp:  98.2 F (36.8 C) 98 F (36.7 C) 98 F (36.7 C)  TempSrc:   Oral Oral  Resp: 21 16 18 18   Height:  5\' 2"  (1.575 m)    Weight:  92.897 kg (204 lb 12.8 oz)  91.627 kg (202 lb)  SpO2: 100% 100% 100% 100%    Wt Readings from Last 3 Encounters:  10/13/12 91.627 kg (202  lb)  04/10/11 87.544 kg (193 lb)     Intake/Output Summary (Last 24 hours) at 10/13/12 1004 Last data filed at 10/13/12 0955  Gross per 24 hour  Intake    340 ml  Output   3050 ml  Net  -2710 ml    Exam Awake Alert, Oriented X 3, No new F.N deficits, Normal affect [place].AT,PERRAL Supple Neck,No JVD, No cervical lymphadenopathy appriciated.  Symmetrical Chest wall movement, Good air movement bilaterally, CTAB RRR,No Gallops,Rubs or new Murmurs, No Parasternal Heave +ve B.Sounds, Abd Soft, Non tender, No organomegaly appriciated, No rebound - guarding or rigidity. No Cyanosis, Clubbing , trace edema, No new Rash or bruise     Data Review   Micro Results No results found for this or any previous visit (from the past 240 hour(s)).  Radiology Reports Dg Chest 2 View  10/12/2012   CLINICAL DATA:  Shortness of Breath. Fatigue.  EXAM: CHEST  2 VIEW  COMPARISON:  03/17/2007  FINDINGS: New bibasilar pulmonary opacity is seen which may be due to atelectasis or infiltrates. Small pleural effusions are also new. Heart size is within normal limits. Dual lead transvenous pacemaker remains in appropriate position.  IMPRESSION: New bibasilar atelectasis versus infiltrates and small pleural effusions.   Electronically Signed   By: Myles Rosenthal   On: 10/12/2012 12:05    CBC  Recent Labs Lab 10/12/12 1051 10/12/12 1054 10/13/12 0350  WBC 11.3*  --  9.7  HGB 13.8 15.3* 12.9  HCT 38.9 45.0 37.5  PLT 323  --  285  MCV 89.0  --  89.5  MCH 31.6  --  30.8  MCHC 35.5  --  34.4  RDW 13.6  --  13.7  LYMPHSABS 1.0  --   --   MONOABS 0.8  --   --   EOSABS 0.1  --   --   BASOSABS 0.0  --   --     Chemistries   Recent Labs Lab 10/12/12 1051 10/12/12 1054 10/13/12 0350  NA 123* 126* 131*  K 5.3* 5.3* 3.3*  CL 90* 94* 92*  CO2 19  --  24  GLUCOSE 209* 220* 127*  BUN 15 17 14   CREATININE 1.07 1.10 0.95  CALCIUM 9.2  --  8.7  AST 15  --   --   ALT 10  --   --   ALKPHOS 52  --   --    BILITOT 0.7  --   --    ------------------------------------------------------------------------------------------------------------------ estimated creatinine clearance is 40.6 ml/min (by C-G formula based on Cr of 0.95). ------------------------------------------------------------------------------------------------------------------ No results found for this basename: HGBA1C,  in the last 72 hours ------------------------------------------------------------------------------------------------------------------ No results found for this basename: CHOL, HDL, LDLCALC, TRIG, CHOLHDL, LDLDIRECT,  in the last 72 hours ------------------------------------------------------------------------------------------------------------------ No results found for this basename: TSH, T4TOTAL, FREET3, T3FREE, THYROIDAB,  in the last 72 hours ------------------------------------------------------------------------------------------------------------------ No results found for this basename: VITAMINB12, FOLATE, FERRITIN, TIBC, IRON, RETICCTPCT,  in the last 72 hours  Coagulation profile No results found for this basename: INR, PROTIME,  in the last 168 hours  No results found for this basename: DDIMER,  in the last 72 hours  Cardiac Enzymes  Recent Labs Lab 10/12/12 1742 10/12/12 2242 10/13/12 0350  TROPONINI <0.30 <0.30 <0.30   ------------------------------------------------------------------------------------------------------------------ No components found with this basename: POCBNP,

## 2012-10-14 DIAGNOSIS — R9431 Abnormal electrocardiogram [ECG] [EKG]: Secondary | ICD-10-CM

## 2012-10-14 DIAGNOSIS — I5041 Acute combined systolic (congestive) and diastolic (congestive) heart failure: Principal | ICD-10-CM

## 2012-10-14 LAB — BASIC METABOLIC PANEL
BUN: 16 mg/dL (ref 6–23)
Calcium: 8.5 mg/dL (ref 8.4–10.5)
Creatinine, Ser: 1.12 mg/dL — ABNORMAL HIGH (ref 0.50–1.10)
GFR calc Af Amer: 48 mL/min — ABNORMAL LOW (ref >90)
GFR calc non Af Amer: 42 mL/min — ABNORMAL LOW (ref >90)

## 2012-10-14 LAB — GLUCOSE, CAPILLARY
Glucose-Capillary: 133 mg/dL — ABNORMAL HIGH (ref 70–99)
Glucose-Capillary: 153 mg/dL — ABNORMAL HIGH (ref 70–99)
Glucose-Capillary: 158 mg/dL — ABNORMAL HIGH (ref 70–99)

## 2012-10-14 LAB — TSH: TSH: 1.409 u[IU]/mL (ref 0.350–4.500)

## 2012-10-14 MED ORDER — SODIUM CHLORIDE 0.9 % IV SOLN
INTRAVENOUS | Status: AC
  Administered 2012-10-14: 1000 mL via INTRAVENOUS

## 2012-10-14 MED ORDER — MAGNESIUM SULFATE 40 MG/ML IJ SOLN
2.0000 g | Freq: Once | INTRAMUSCULAR | Status: AC
  Administered 2012-10-14: 2 g via INTRAVENOUS
  Filled 2012-10-14: qty 50

## 2012-10-14 NOTE — Progress Notes (Signed)
Patinet's QTC .644. Dr. Donnie Aho notified. Orders obtained to hold AM Sotalol dose until seen by cardiology, 12 lead EKG in am, and potassium level in AM.

## 2012-10-14 NOTE — Progress Notes (Signed)
Utilization Review Completed.   Ezrael Sam, RN, BSN Nurse Case Manager  [phone]  

## 2012-10-14 NOTE — Progress Notes (Signed)
Patient ambulated to hallway x1 with walker.  Tolerated well.  RN will continue to monitor. Louretta Parma RN

## 2012-10-14 NOTE — Progress Notes (Signed)
Physical Therapy Treatment Patient Details Name: [name] MRN: [id number] DOB: February 18, 1919 Today's Date: 10/14/2012 Time: 4098-1191 PT Time Calculation (min): 28 min  PT Assessment / Plan / Recommendation  History of Present Illness 79 y.o. female with a Past Medical History of sick sinus syndrome status post permanent pacemaker placement, atrial fibrillation, hypertension who presents today with the above noted complaint. Per patient, she has not been her usual self for the past 2 weeks, on 7/30 she presented to the emergency room for "weakness", she was evaluated and thought to be dehydrated, she received some fluids and then was discharged.For the past 5-7 days, patient has had dyspnea, exclusively on exertion, this has slowly worsened. For the past 2 days even simply walking to the bathroom makes her short of breath. She also has noticed worsening bilateral lower extremity edema. She presented to the emergency room with these complaints, she was found to have worsening of her hyponatremia and a significantly elevated BNP level. Her symptoms were thought to be secondary to new onset CHF, I was subsequently asked to admit this patient for further evaluation and treatment.   PT Comments   Pt willing to participate in therapy.  Conts to be limited by generalized weakness & decreased activity tolerance.  Pt's daughter present entire session & states she will be staying with the patient for couple of days initially.     Follow Up Recommendations  Home health PT;Supervision/Assistance - 24 hour;SNF     Does the patient have the potential to tolerate intense rehabilitation     Barriers to Discharge        Equipment Recommendations  3in1 (PT)    Recommendations for Other Services    Frequency Min 3X/week   Progress towards PT Goals Progress towards PT goals: Progressing toward goals  Plan Current plan remains appropriate    Precautions / Restrictions Precautions Precautions:  Fall Restrictions Weight Bearing Restrictions: No   Pertinent Vitals/Pain No pain reported.     Mobility  Bed Mobility Bed Mobility: Supine to Sit;Sitting - Scoot to Edge of Bed Supine to Sit: 5: Supervision;HOB flat;With rails Sitting - Scoot to Edge of Bed: 5: Supervision Details for Bed Mobility Assistance: incr time Transfers Transfers: Sit to Stand;Stand to Sit Sit to Stand: 4: Min assist;With upper extremity assist;From bed Stand to Sit: 4: Min assist;With upper extremity assist;With armrests;To chair/3-in-1 Details for Transfer Assistance: cues for hand placement & body positioning before sitting down.  (A) to achieve standing, balance, & controlled descent.  Ambulation/Gait Ambulation/Gait Assistance: 4: Min guard Ambulation Distance (Feet): 40 Feet Assistive device: Rolling walker Ambulation/Gait Assistance Details: (A) for balance & safety.  Cues for posture & safety with RW due to pt wanting to lean forwards with forearms/elbows on RW.  Fwd flexed posture & small shuffling steps.  Gait Pattern: Step-through pattern;Decreased stride length;Trunk flexed;Shuffle Gait velocity: slow Stairs: No Wheelchair Mobility Wheelchair Mobility: No    Exercises     PT Diagnosis:    PT Problem List:   PT Treatment Interventions:     PT Goals (current goals can now be found in the care plan section) Acute Rehab PT Goals PT Goal Formulation: With patient/family Time For Goal Achievement: 10/20/12 Potential to Achieve Goals: Good  Visit Information  Last PT Received On: 10/14/12 Assistance Needed: +1 History of Present Illness: 79 y.o. female with a Past Medical History of sick sinus syndrome status post permanent pacemaker placement, atrial fibrillation, hypertension who presents today with the above noted complaint. Per  patient, she has not been her usual self for the past 2 weeks, on 7/30 she presented to the emergency room for "weakness", she was evaluated and thought to be  dehydrated, she received some fluids and then was discharged.For the past 5-7 days, patient has had dyspnea, exclusively on exertion, this has slowly worsened. For the past 2 days even simply walking to the bathroom makes her short of breath. She also has noticed worsening bilateral lower extremity edema. She presented to the emergency room with these complaints, she was found to have worsening of her hyponatremia and a significantly elevated BNP level. Her symptoms were thought to be secondary to new onset CHF, I was subsequently asked to admit this patient for further evaluation and treatment.    Subjective Data      Cognition  Cognition Arousal/Alertness: Awake/alert Behavior During Therapy: WFL for tasks assessed/performed Overall Cognitive Status: Within Functional Limits for tasks assessed    Balance     End of Session PT - End of Session Equipment Utilized During Treatment: Gait belt Activity Tolerance: Patient tolerated treatment well Patient left: in chair;with call bell/phone within reach;with family/visitor present Nurse Communication: Mobility status   GP     [name] 10/14/2012, 2:36 PM   [name], PTA [phone] 10/14/2012

## 2012-10-14 NOTE — Progress Notes (Addendum)
Patient Name: [name] Date of Encounter: 10/14/2012    SUBJECTIVE:Dyspnea has improved. Was taking sodium chloride tablets at home.  TELEMETRY:  Sinus rhythm and paced ventricular beats: Filed Vitals:   10/13/12 0624 10/13/12 1359 10/13/12 2030 10/14/12 0457  BP: 122/50 104/48 112/60 95/43  Pulse: 65 68 67 65  Temp: 98 F (36.7 C) 97.2 F (36.2 C) 97.6 F (36.4 C) 97.8 F (36.6 C)  TempSrc: Oral Oral Oral Oral  Resp: 18 19 18 19   Height:      Weight: 91.627 kg (202 lb)   90.2 kg (198 lb 13.7 oz)  SpO2: 100% 95% 98% 97%    Intake/Output Summary (Last 24 hours) at 10/14/12 1039 Last data filed at 10/13/12 1900  Gross per 24 hour  Intake    240 ml  Output      0 ml  Net    240 ml    LABS: Basic Metabolic Panel:  Recent Labs  16/10/96 0350 10/14/12 0500  NA 131* 132*  K 3.3* 4.0  CL 92* 94*  CO2 24 27  GLUCOSE 127* 130*  BUN 14 16  CREATININE 0.95 1.12*  CALCIUM 8.7 8.5  MG  --  1.2*   CBC:  Recent Labs  10/12/12 1051 10/12/12 1054 10/13/12 0350  WBC 11.3*  --  9.7  NEUTROABS 9.4*  --   --   HGB 13.8 15.3* 12.9  HCT 38.9 45.0 37.5  MCV 89.0  --  89.5  PLT 323  --  285   Radiology/Studies:  EXAM: CHEST 2 VIEW  COMPARISON: 03/17/2007  FINDINGS: New bibasilar pulmonary opacity is seen which may be due to atelectasis or infiltrates. Small pleural effusions are also new. Heart size is within normal limits. Dual lead transvenous pacemaker remains in appropriate position.  IMPRESSION: New bibasilar atelectasis versus infiltrates and small pleural effusions.   Electronically Signed By: Myles Rosenthal On: 10/12/2012 12:05        ECHOCARDIOGRAM:10/13/12 Study Conclusions  - Procedure narrative: Transthoracic echocardiography. Image quality was adequate. The study was technically difficult. - Left ventricle: Distal septal and posterior basal wall hypokinesis The cavity size was normal. Wall thickness was increased in a pattern of mild  LVH. Systolic function was mildly reduced. The estimated ejection fraction was in the range of 45% to 50%.   BNP    Component Value Date/Time   PROBNP 9840.0* 10/12/2012 1052   Physical Exam: Blood pressure 95/43, pulse 65, temperature 97.8 F (36.6 C), temperature source Oral, resp. rate 19, height 5\' 2"  (1.575 m), weight 90.2 kg (198 lb 13.7 oz), SpO2 97.00%. Weight change: -2.697 kg (-5 lb 15.1 oz)   No obvious abnormality on exam other than elderly age. Lying flat and comfortably in bed  Regular rhythm  No murmur or gallop.    ASSESSMENT:  1. Tachybradycardia syndrome treated with permanent pacemaker and sotalol therapy (suppression of A. Fib)  2. Prolonged QTC  3. Acute on chronic combined systolic and diastolic heart failure, improved with diuretics. Reviewed [facility] records, and patient was being given sodium chloride tablets as outpatient.  Plan:  1. Correct hypokalemia and hypomagnesemia  2. Hold sotalol until the QTC is within/less than 500 ms. We will likely decrease to 80 mg BID when we restart.  3. Agree that chronic diuretic therapy will be needed. Outpatient electrolyte stability needs to be documented early after discharge.  4. Aoid salt loading as OP  Signed, Lesleigh Noe 10/14/2012, 10:39 AM

## 2012-10-14 NOTE — Progress Notes (Signed)
[facility]                                                                                Patient Demographics  [name], is a 79 y.o. female, DOB - 11-Oct-1918, ZOX:[id number], WJX:[id number]  Admit date - 10/12/2012  Admitting Physician Dewayne Shorter Levora Dredge, MD  Outpatient Primary MD for the patient is FULP, CAMMIE, MD  LOS - 2   Chief Complaint  Patient presents with  . Fatigue  . Shortness of Breath        Assessment & Plan    . CHF, acute on chronic likely diastolic  She was recently diagnosed with UTI and was asked to drink plenty of fluids which she was doing, has likely put her in fluid overload. Troponin is negative, she feels much better after IV Lasix, now close to compensated, will check echogram, have discussed her care with Dr. Elijah Birk wall cardiologist on call. If echo is stable and she looks better tomorrow will place her on small dose Lasix with fluid and salt restriction and discharge home with close followup with primary cardiologist Dr.Varanasi. She is on sotalol and ACE inhibitor, both held due to elevated QTC and mildly increased creatinine on 10/14/2012.    Patient became mildly dehydrated on 10/14/2012 and somewhat hypotensive   secondary to diarrhea on 10/14/2012 which caused mild dehydration in the setting of diuretics. Diuretics and blood pressure medications held on that date, gentle 100 cc normal saline for 5 hours. No diarrhea a C. difficile negative when necessary Imodium be given.     Marland Kitchen Hyponatremia  -Was secondary to volume overload improving with diuresis. Lasix at a lower dose continued.    . A-fib (?sick sinus)  - History of chronic atrial fibrillation was maintained on sotalol. Has a permanent pacemaker in place  - Given advanced age, doubt a anticoagulation candidate.  Follow and monitor on telemetry , currently stable will follow with primary cardiologist Dr. Eldridge Dace post discharge within 2-3 days.    Prolonged QTC on  10/14/2012.  2 g of IV magnesium given on 10/14/2012, sotalol held per cardiology recommendation, will be held till QTC is below 500 ms then resumed at home dose.    . Diabetes  - Hold the metformin, and place on SSI   CBG (last 3)   Recent Labs  10/13/12 1625 10/13/12 2228 10/14/12 0621  GLUCAP 162* 149* 133*    Lab Results  Component Value Date   HGBA1C  Value: 6.7 (NOTE)   The ADA recommends the following therapeutic goals for glycemic   control related to Hgb A1C measurement:   Goal of Therapy:   < 7.0% Hgb A1C   Action Suggested:  > 8.0% Hgb A1C   Ref:  Diabetes Care, 22, Suppl. 1, 1999* 08/07/2006     . Hyperlipidemia  - Continue statin    Recent UTI.  That is actually treated, current UA is clean.     Code Status: [place]  Family Communication: none present  Disposition Plan: Home   Procedures Echo   Consults  Dr Daleen Squibb cards over the Phone   DVT Prophylaxis  Lovenox   Lab Results  Component Value Date  PLT 285 10/13/2012    Medications  Scheduled Meds: . aspirin EC  325 mg Oral Daily  . enoxaparin (LOVENOX) injection  40 mg Subcutaneous Q24H  . insulin aspart  0-9 Units Subcutaneous TID WC  . simvastatin  40 mg Oral QPM  . sodium chloride  3 mL Intravenous Q12H  . sodium chloride  3 mL Intravenous Q12H   Continuous Infusions: . sodium chloride 1,000 mL (10/14/12 0908)   PRN Meds:.sodium chloride, acetaminophen, albuterol, alum & mag hydroxide-simeth, loperamide, ondansetron (ZOFRAN) IV, sodium chloride  Antibiotics    Anti-infectives   None       Time Spent in minutes  35   Susa Raring K M.D on 10/14/2012 at 10:50 AM  Between 7am to 7pm - Pager - [phone]  After 7pm go to [email or web address] - password [email or web address]  And look for the night coverage person covering for me after hours  [facility] Office  [phone]    Subjective:   [name] today has, No headache, No chest pain, No abdominal pain - No  Nausea, No new weakness tingling or numbness, No Cough - SOB.   Objective:   Filed Vitals:   10/13/12 0624 10/13/12 1359 10/13/12 2030 10/14/12 0457  BP: 122/50 104/48 112/60 95/43  Pulse: 65 68 67 65  Temp: 98 F (36.7 C) 97.2 F (36.2 C) 97.6 F (36.4 C) 97.8 F (36.6 C)  TempSrc: Oral Oral Oral Oral  Resp: 18 19 18 19   Height:      Weight: 91.627 kg (202 lb)   90.2 kg (198 lb 13.7 oz)  SpO2: 100% 95% 98% 97%    Wt Readings from Last 3 Encounters:  10/14/12 90.2 kg (198 lb 13.7 oz)  04/10/11 87.544 kg (193 lb)     Intake/Output Summary (Last 24 hours) at 10/14/12 1050 Last data filed at 10/13/12 1900  Gross per 24 hour  Intake    240 ml  Output      0 ml  Net    240 ml    Exam Awake Alert, Oriented X 3, No new F.N deficits, Normal affect Steele.AT,PERRAL Supple Neck,No JVD, No cervical lymphadenopathy appriciated.  Symmetrical Chest wall movement, Good air movement bilaterally, CTAB RRR,No Gallops,Rubs or new Murmurs, No Parasternal Heave +ve B.Sounds, Abd Soft, Non tender, No organomegaly appriciated, No rebound - guarding or rigidity. No Cyanosis, Clubbing , trace edema, No new Rash or bruise     Data Review   Micro Results Recent Results (from the past 240 hour(s))  CLOSTRIDIUM DIFFICILE BY PCR     Status: None   Collection Time    10/13/12 12:06 PM      Result Value Range Status   C difficile by pcr NEGATIVE  NEGATIVE Final    Radiology Reports Dg Chest 2 View  10/12/2012   CLINICAL DATA:  Shortness of Breath. Fatigue.  EXAM: CHEST  2 VIEW  COMPARISON:  03/17/2007  FINDINGS: New bibasilar pulmonary opacity is seen which may be due to atelectasis or infiltrates. Small pleural effusions are also new. Heart size is within normal limits. Dual lead transvenous pacemaker remains in appropriate position.  IMPRESSION: New bibasilar atelectasis versus infiltrates and small pleural effusions.   Electronically Signed   By: Myles Rosenthal   On: 10/12/2012 12:05     CBC  Recent Labs Lab 10/12/12 1051 10/12/12 1054 10/13/12 0350  WBC 11.3*  --  9.7  HGB 13.8 15.3* 12.9  HCT 38.9 45.0 37.5  PLT 323  --  285  MCV 89.0  --  89.5  MCH 31.6  --  30.8  MCHC 35.5  --  34.4  RDW 13.6  --  13.7  LYMPHSABS 1.0  --   --   MONOABS 0.8  --   --   EOSABS 0.1  --   --   BASOSABS 0.0  --   --     Chemistries   Recent Labs Lab 10/12/12 1051 10/12/12 1054 10/13/12 0350 10/14/12 0500  NA 123* 126* 131* 132*  K 5.3* 5.3* 3.3* 4.0  CL 90* 94* 92* 94*  CO2 19  --  24 27  GLUCOSE 209* 220* 127* 130*  BUN 15 17 14 16   CREATININE 1.07 1.10 0.95 1.12*  CALCIUM 9.2  --  8.7 8.5  MG  --   --   --  1.2*  AST 15  --   --   --   ALT 10  --   --   --   ALKPHOS 52  --   --   --   BILITOT 0.7  --   --   --    ------------------------------------------------------------------------------------------------------------------ estimated creatinine clearance is 34.1 ml/min (by C-G formula based on Cr of 1.12). ------------------------------------------------------------------------------------------------------------------ No results found for this basename: HGBA1C,  in the last 72 hours ------------------------------------------------------------------------------------------------------------------ No results found for this basename: CHOL, HDL, LDLCALC, TRIG, CHOLHDL, LDLDIRECT,  in the last 72 hours ------------------------------------------------------------------------------------------------------------------  Recent Labs  10/13/12 0905  TSH 1.409   ------------------------------------------------------------------------------------------------------------------ No results found for this basename: VITAMINB12, FOLATE, FERRITIN, TIBC, IRON, RETICCTPCT,  in the last 72 hours  Coagulation profile No results found for this basename: INR, PROTIME,  in the last 168 hours  No results found for this basename: DDIMER,  in the last 72 hours  Cardiac  Enzymes  Recent Labs Lab 10/12/12 1742 10/12/12 2242 10/13/12 0350  TROPONINI <0.30 <0.30 <0.30   ------------------------------------------------------------------------------------------------------------------ No components found with this basename: POCBNP,

## 2012-10-15 LAB — BASIC METABOLIC PANEL
BUN: 15 mg/dL (ref 6–23)
CO2: 27 mEq/L (ref 19–32)
Calcium: 8.5 mg/dL (ref 8.4–10.5)
Chloride: 96 mEq/L (ref 96–112)
Creatinine, Ser: 0.84 mg/dL (ref 0.50–1.10)
Creatinine, Ser: 0.84 mg/dL (ref 0.50–1.10)
GFR calc Af Amer: 68 mL/min — ABNORMAL LOW (ref >90)
GFR calc Af Amer: 68 mL/min — ABNORMAL LOW (ref >90)
GFR calc non Af Amer: 59 mL/min — ABNORMAL LOW (ref >90)
Glucose, Bld: 143 mg/dL — ABNORMAL HIGH (ref 70–99)
Potassium: 3.4 mEq/L — ABNORMAL LOW (ref 3.5–5.1)
Potassium: 5.2 mEq/L — ABNORMAL HIGH (ref 3.5–5.1)
Sodium: 133 mEq/L — ABNORMAL LOW (ref 135–145)

## 2012-10-15 LAB — GLUCOSE, CAPILLARY: Glucose-Capillary: 134 mg/dL — ABNORMAL HIGH (ref 70–99)

## 2012-10-15 MED ORDER — POTASSIUM CHLORIDE CRYS ER 20 MEQ PO TBCR
40.0000 meq | EXTENDED_RELEASE_TABLET | Freq: Once | ORAL | Status: AC
  Administered 2012-10-15: 40 meq via ORAL
  Filled 2012-10-15: qty 2

## 2012-10-15 MED ORDER — MAGNESIUM SULFATE IN D5W 10-5 MG/ML-% IV SOLN
1.0000 g | Freq: Once | INTRAVENOUS | Status: AC
  Administered 2012-10-15: 1 g via INTRAVENOUS
  Filled 2012-10-15: qty 100

## 2012-10-15 MED ORDER — FUROSEMIDE 20 MG PO TABS
20.0000 mg | ORAL_TABLET | Freq: Every day | ORAL | Status: DC
  Administered 2012-10-16: 20 mg via ORAL
  Filled 2012-10-15: qty 1

## 2012-10-15 MED ORDER — ALPRAZOLAM 0.25 MG PO TABS
0.2500 mg | ORAL_TABLET | Freq: Every evening | ORAL | Status: DC | PRN
  Administered 2012-10-15: 0.25 mg via ORAL
  Filled 2012-10-15: qty 1

## 2012-10-15 MED ORDER — LISINOPRIL 2.5 MG PO TABS
2.5000 mg | ORAL_TABLET | Freq: Every day | ORAL | Status: DC
  Administered 2012-10-15 – 2012-10-16 (×2): 2.5 mg via ORAL
  Filled 2012-10-15 (×2): qty 1

## 2012-10-15 NOTE — Progress Notes (Signed)
Pt up in chair resting.  Crying subsided.  Son in room with her.  On coming  Nurse made aware of PRN xanax for HS and of  pt concern for her son with CA.  Amanda Pea, Charity fundraiser.

## 2012-10-15 NOTE — Progress Notes (Signed)
The patient is A&Ox4 this morning and does not have any complaints.  She did not have any acute changes overnight.  A Heart Failure packet was given to the patient.

## 2012-10-15 NOTE — Progress Notes (Signed)
SUBJECTIVE:  SOB is much improved- now only DOE when in hall walking  OBJECTIVE:   Vitals:   Filed Vitals:   10/14/12 2217 10/15/12 0513 10/15/12 0700 10/15/12 1152  BP: 123/57 178/91 139/77 128/80  Pulse: 80 98    Temp: 97.3 F (36.3 C) 97.7 F (36.5 C)    TempSrc: Oral Oral    Resp: 20 20    Height:      Weight:  199 lb 3.2 oz (90.357 kg)    SpO2: 93% 95%     I&O's:   Intake/Output Summary (Last 24 hours) at 10/15/12 1257 Last data filed at 10/15/12 0517  Gross per 24 hour  Intake    463 ml  Output    300 ml  Net    163 ml   TELEMETRY: Reviewed telemetry pt in atrial fibrillation at 90bpm     PHYSICAL EXAM General: Well developed, well nourished, in no acute distress Head: Eyes PERRLA, No xanthomas.   Normal cephalic and atramatic  Lungs:   Clear bilaterally to auscultation and percussion. Heart:   Irregularly irregular S1 S2 Pulses are 2+ & equal. Abdomen: Bowel sounds are positive, abdomen soft and non-tender without masses Extremities:   No clubbing, cyanosis or edema.  DP +1 Neuro: Alert and oriented X 3. Psych:  Good affect, responds appropriately   LABS: Basic Metabolic Panel:  Recent Labs  16/10/96 0500 10/15/12 0500  NA 132* 133*  K 4.0 3.4*  CL 94* 95*  CO2 27 27  GLUCOSE 130* 143*  BUN 16 15  CREATININE 1.12* 0.84  CALCIUM 8.5 8.5  MG 1.2* 1.6   Liver Function Tests: No results found for this basename: AST, ALT, ALKPHOS, BILITOT, PROT, ALBUMIN,  in the last 72 hours No results found for this basename: LIPASE, AMYLASE,  in the last 72 hours CBC:  Recent Labs  10/13/12 0350  WBC 9.7  HGB 12.9  HCT 37.5  MCV 89.5  PLT 285   Cardiac Enzymes:  Recent Labs  10/12/12 1742 10/12/12 2242 10/13/12 0350  TROPONINI <0.30 <0.30 <0.30   BNP: No components found with this basename: POCBNP,  D-Dimer: No results found for this basename: DDIMER,  in the last 72 hours Hemoglobin A1C: No results found for this basename: HGBA1C,  in the  last 72 hours Fasting Lipid Panel: No results found for this basename: CHOL, HDL, LDLCALC, TRIG, CHOLHDL, LDLDIRECT,  in the last 72 hours Thyroid Function Tests:  Recent Labs  10/13/12 0905  TSH 1.409   Anemia Panel: No results found for this basename: VITAMINB12, FOLATE, FERRITIN, TIBC, IRON, RETICCTPCT,  in the last 72 hours Coag Panel:   Lab Results  Component Value Date   INR 1.84* 08/19/2009   INR 2.3* 11/09/2007   INR 2.3* 03/17/2007    RADIOLOGY: Dg Chest 2 View  10/12/2012   CLINICAL DATA:  Shortness of Breath. Fatigue.  EXAM: CHEST  2 VIEW  COMPARISON:  03/17/2007  FINDINGS: New bibasilar pulmonary opacity is seen which may be due to atelectasis or infiltrates. Small pleural effusions are also new. Heart size is within normal limits. Dual lead transvenous pacemaker remains in appropriate position.  IMPRESSION: New bibasilar atelectasis versus infiltrates and small pleural effusions.   Electronically Signed   By: Myles Rosenthal   On: 10/12/2012 12:05    ASSESSMENT:  1. Tachybradycardia syndrome treated with permanent pacemaker and sotalol therapy (suppression of A. Fib)  2. Prolonged QTC  3. Acute on chronic combined systolic and  diastolic heart failure, improved with diuretics. Reviewed [facility] records, and patient was being given sodium chloride tablets as outpatient.  Plan:  1. Correct hypokalemia and hypomagnesemia - will recheck BMET today 2. Hold sotalol until the QTC is within/less than 500 ms. We will likely decrease to 80 mg BID when we restart.  3. Agree that chronic diuretic therapy will be needed. Outpatient electrolyte stability needs to be documented early after discharge.  4. Aoid salt loading as OP       Quintella Reichert, MD  10/15/2012  12:57 PM

## 2012-10-15 NOTE — Care Management Note (Signed)
    Page 1 of 1   10/15/2012     2:42:04 PM   CARE MANAGEMENT NOTE 10/15/2012  Patient:  [facility]   Account Number:  [id number]  Date Initiated:  10/15/2012  Documentation initiated by:  Tera Mater  Subjective/Objective Assessment:   79yo female admitted with exertional dyspnea for the past 3-5 days.  Pt. lvies alone, however, her son assists her when needed.     Action/Plan:   discharge planning   Anticipated DC Date:  10/16/2012   Anticipated DC Plan:  HOME/SELF CARE      DC Planning Services  CM consult      Choice offered to / List presented to:          [facility] arranged  HH - 11 Patient Refused      Status of service:  In process, will continue to follow Medicare Important Message given?   (If response is "NO", the following Medicare IM given date fields will be blank) Date Medicare IM given:   Date Additional Medicare IM given:    Discharge Disposition:    Per UR Regulation:  Reviewed for med. necessity/level of care/duration of stay  If discussed at Long Length of Stay Meetings, dates discussed:    Comments:  10/15/12 1415 In to speak with pt. and daughter about home health services.  Pt. does not want home health, citing that her insurance does not completely pay for these servcies, and she feels like she can do the physical therapy exercises on her own.  Pt. does not want any DME, due to cost as well. Anticipate dc home tomorrow. Tera Mater, RN, BSN NCM [phone]

## 2012-10-15 NOTE — Progress Notes (Signed)
Physical Therapy Treatment Patient Details Name: [name] MRN: [id number] DOB: 1919-01-29 Today's Date: 10/15/2012 Time: 0921-0940 PT Time Calculation (min): 19 min  PT Assessment / Plan / Recommendation  History of Present Illness 79 y.o. female with a Past Medical History of sick sinus syndrome status post permanent pacemaker placement, atrial fibrillation, hypertension who presents today with the above noted complaint. Per patient, she has not been her usual self for the past 2 weeks, on 7/30 she presented to the emergency room for "weakness", she was evaluated and thought to be dehydrated, she received some fluids and then was discharged.For the past 5-7 days, patient has had dyspnea, exclusively on exertion, this has slowly worsened. For the past 2 days even simply walking to the bathroom makes her short of breath. She also has noticed worsening bilateral lower extremity edema. She presented to the emergency room with these complaints, she was found to have worsening of her hyponatremia and a significantly elevated BNP level. Her symptoms were thought to be secondary to new onset CHF, I was subsequently asked to admit this patient for further evaluation and treatment.   PT Comments   Pt tolerated increased ambulation distance today.  Pt appropriate from PT standpoint to safely d/c home with daughter + HHPT when MD feels pt medically ready.     Follow Up Recommendations  Home health PT;Supervision/Assistance - 24 hour;SNF     Does the patient have the potential to tolerate intense rehabilitation     Barriers to Discharge        Equipment Recommendations  3in1 (PT)    Recommendations for Other Services    Frequency Min 3X/week   Progress towards PT Goals Progress towards PT goals: Progressing toward goals  Plan Current plan remains appropriate    Precautions / Restrictions Precautions Precautions: Fall Restrictions Weight Bearing Restrictions: No   Pertinent Vitals/Pain HR  140's after ambulating.  Recovered quickly with seated rest break.     Mobility  Bed Mobility Bed Mobility: Not assessed Transfers Transfers: Sit to Stand;Stand to Sit Sit to Stand: 4: Min assist;With upper extremity assist;With armrests;From chair/3-in-1 Stand to Sit: 4: Min assist;With upper extremity assist;With armrests;To chair/3-in-1 Details for Transfer Assistance: (A) to achieve standing & controlled descent.  Ambulation/Gait Ambulation/Gait Assistance: 4: Min guard Ambulation Distance (Feet): 80 Feet Assistive device: Rolling walker Ambulation/Gait Assistance Details: Guarding for safety.  Cues for upright posture but pt states this is how she walks.   Gait Pattern: Step-through pattern;Decreased stride length;Trunk flexed;Shuffle Gait velocity: slow General Gait Details: Pt's HR increased to 140's after ambulation but quickly recovered with rest break.  Wheelchair Mobility Wheelchair Mobility: No    Exercises General Exercises - Lower Extremity Long Arc Quad: AROM;Strengthening;Both;10 reps Hip ABduction/ADduction: AROM;Strengthening;Both;10 reps Hip Flexion/Marching: AROM;Strengthening;Both;10 reps Toe Raises: AROM;Both;10 reps Heel Raises: AROM;Both;10 reps     PT Goals (current goals can now be found in the care plan section) Acute Rehab PT Goals PT Goal Formulation: With patient/family Time For Goal Achievement: 10/20/12 Potential to Achieve Goals: Good  Visit Information  Last PT Received On: 10/15/12 Assistance Needed: +1 History of Present Illness: 79 y.o. female with a Past Medical History of sick sinus syndrome status post permanent pacemaker placement, atrial fibrillation, hypertension who presents today with the above noted complaint. Per patient, she has not been her usual self for the past 2 weeks, on 7/30 she presented to the emergency room for "weakness", she was evaluated and thought to be dehydrated, she received some fluids and  then was  discharged.For the past 5-7 days, patient has had dyspnea, exclusively on exertion, this has slowly worsened. For the past 2 days even simply walking to the bathroom makes her short of breath. She also has noticed worsening bilateral lower extremity edema. She presented to the emergency room with these complaints, she was found to have worsening of her hyponatremia and a significantly elevated BNP level. Her symptoms were thought to be secondary to new onset CHF, I was subsequently asked to admit this patient for further evaluation and treatment.    Subjective Data      Cognition  Cognition Arousal/Alertness: Awake/alert Behavior During Therapy: WFL for tasks assessed/performed Overall Cognitive Status: Within Functional Limits for tasks assessed    Balance     End of Session PT - End of Session Activity Tolerance: Patient tolerated treatment well Patient left: in chair;with call bell/phone within reach;with family/visitor present Nurse Communication: Mobility status     [name], [place] [phone] 10/15/2012

## 2012-10-15 NOTE — Plan of Care (Signed)
Problem: Phase I Progression Outcomes Goal: EF % per last Echo/documented,Core Reminder form on chart Outcome: Completed/Met Date Met:  10/15/12 EF 45-50%

## 2012-10-15 NOTE — Progress Notes (Signed)
[name] in chair and son in room with her.  Noted to be resting quietly and talking to her son.  Informed pt & family  of xanax pt can have if  needed at bed, ordered by Dr. Thedore Mins.  Will continue to monitor.  Amanda Pea, Charity fundraiser.

## 2012-10-15 NOTE — Progress Notes (Signed)
Noted pt with teary eyes after was informed by daughter and son that one of his sons CA  Prognosis is bad.   Support and attempted make her comfortable and family at bedside requested that we  notify MD and declined chaplin service and stated that they have someone from church to see her.  Dr Thedore Mins notified and order xanax 0.25mg  po QHS PRN.  Will continue to monitor.  Amanda Pea, Charity fundraiser.

## 2012-10-15 NOTE — Progress Notes (Signed)
[facility]                                                                                Patient Demographics  [name], is a 79 y.o. female, DOB - 06-16-1918, YQI:[id number], LOV:[id number]  Admit date - 10/12/2012  Admitting Physician Dewayne Shorter Levora Dredge, MD  Outpatient Primary MD for the patient is FULP, CAMMIE, MD  LOS - 3   Chief Complaint  Patient presents with  . Fatigue  . Shortness of Breath      Brief summary  This is a pleasant 79 year old female whose in good state of health was apparent to the hospital for acute or chronic diastolic CHF caused by excessive fluid intake, she is now compensated after initial diuresis, requires low-dose Lasix  along with fluid and salt restriction, she has long-standing history of atrial fibrillation and was on sotalol, she was ready for discharge however her QTC has been noted to be quite high, cardiology has been consulted and they have holding her sotalol till QTC 4 spell of 500 and sotalol can be started at a lower dose.    Assessment & Plan    . CHF, acute on chronic from diastolic and Systolic  She was recently diagnosed with UTI and was asked to drink plenty of fluids which she was doing, has likely put her in fluid overload. She has been diuresed with Lasix and now she is compensated, she will be managed on fluid and salt restriction and low dose Lasix, currently she appears euvolemic. On aspirin and statin, beta blocker sotalol on hold due to prolonged QTC cardiology following. Will add low dose ACE.   Echo  - Procedure narrative: Transthoracic echocardiography. Image  quality was adequate. The study was technically difficult.  - Left ventricle: Distal septal and posterior basal wall hypokinesis The cavity size was normal. Wall thickness was increased in a pattern of mild LVH. Systolic function was mildly reduced. The estimated ejection fraction was in the range of 45% to 50%. - Aortic valve: Mild  regurgitation.     Marland Kitchen A-fib (?sick sinus)  - History of chronic atrial fibrillation was maintained on sotalol. Has a permanent pacemaker in place  - Given advanced age, doubt a anticoagulation candidate.  Follow and monitor on telemetry , currently [facility] cardiology is following and she is stable, she will follow with primary cardiologist Dr. Eldridge Dace post discharge within 2-3 days. Daily morning EKGs.    Prolonged QTC on Sotalol  2 g of IV magnesium given on 10/14/2012, sotalol held per cardiology recommendation, will be held till QTC is below 500 ms then resumed at home dose.     Patient became mildly dehydrated on 10/14/2012 and somewhat hypotensive   secondary to diarrhea on 10/14/2012 which caused mild dehydration in the setting of diuretics. Diuretics and blood pressure medications held on that date, gentle 100 cc normal saline for 5 hours. No diarrhea a C. difficile negative when necessary Imodium be given.     Marland Kitchen Hyponatremia  -Was secondary to volume overload improving with diuresis. Lasix at a lower dose continued.     Low K and Mag  Replaced will monitor closely.      Marland Kitchen  Diabetes  - Hold the metformin, and place on SSI   CBG (last 3)   Recent Labs  10/14/12 1613 10/14/12 2210 10/15/12 0630  GLUCAP 158* 138* 127*    Lab Results  Component Value Date   HGBA1C  Value: 6.7 (NOTE)   The ADA recommends the following therapeutic goals for glycemic   control related to Hgb A1C measurement:   Goal of Therapy:   < 7.0% Hgb A1C   Action Suggested:  > 8.0% Hgb A1C   Ref:  Diabetes Care, 22, Suppl. 1, 1999* 08/07/2006     . Hyperlipidemia  - Continue statin    Recent UTI.  That is actually treated, current UA is clean.     Code Status: [place]  Family Communication: none present  Disposition Plan: Home   Procedures Echo   Consults  [place] cards   DVT Prophylaxis  Lovenox   Lab Results  Component Value Date   PLT 285 10/13/2012     Medications  Scheduled Meds: . aspirin EC  325 mg Oral Daily  . insulin aspart  0-9 Units Subcutaneous TID WC  . simvastatin  40 mg Oral QPM  . sodium chloride  3 mL Intravenous Q12H  . sodium chloride  3 mL Intravenous Q12H   Continuous Infusions:   PRN Meds:.sodium chloride, acetaminophen, albuterol, alum & mag hydroxide-simeth, loperamide, ondansetron (ZOFRAN) IV, sodium chloride  Antibiotics    Anti-infectives   None       Time Spent in minutes  35   SINGH,PRASHANT K M.D on 10/15/2012 at 10:01 AM  Between 7am to 7pm - Pager - [phone]  After 7pm go to [email or web address] - password [email or web address]  And look for the night coverage person covering for me after hours  [facility] Office  [phone]    Subjective:   Miarose Lippert today has, No headache, No chest pain, No abdominal pain - No Nausea, No new weakness tingling or numbness, No Cough - SOB.   Objective:   Filed Vitals:   10/14/12 1400 10/14/12 2217 10/15/12 0513 10/15/12 0700  BP: 124/46 123/57 178/91 139/77  Pulse: 60 80 98   Temp: 97.2 F (36.2 C) 97.3 F (36.3 C) 97.7 F (36.5 C)   TempSrc: Oral Oral Oral   Resp: 19 20 20    Height:      Weight:   90.357 kg (199 lb 3.2 oz)   SpO2: 95% 93% 95%     Wt Readings from Last 3 Encounters:  10/15/12 90.357 kg (199 lb 3.2 oz)  04/10/11 87.544 kg (193 lb)     Intake/Output Summary (Last 24 hours) at 10/15/12 1001 Last data filed at 10/15/12 0517  Gross per 24 hour  Intake    463 ml  Output    300 ml  Net    163 ml    Exam Awake Alert, Oriented X 3, No new F.N deficits, Normal affect [place].AT,PERRAL Supple Neck,No JVD, No cervical lymphadenopathy appriciated.  Symmetrical Chest wall movement, Good air movement bilaterally, CTAB RRR,No Gallops,Rubs or new Murmurs, No Parasternal Heave +ve B.Sounds, Abd Soft, Non tender, No organomegaly appriciated, No rebound - guarding or rigidity. No Cyanosis, Clubbing , trace edema, No new Rash or  bruise     Data Review   Micro Results Recent Results (from the past 240 hour(s))  CLOSTRIDIUM DIFFICILE BY PCR     Status: None   Collection Time    10/13/12 12:06 PM      Result Value Range Status  C difficile by pcr NEGATIVE  NEGATIVE Final    Radiology Reports Dg Chest 2 View  10/12/2012   CLINICAL DATA:  Shortness of Breath. Fatigue.  EXAM: CHEST  2 VIEW  COMPARISON:  03/17/2007  FINDINGS: New bibasilar pulmonary opacity is seen which may be due to atelectasis or infiltrates. Small pleural effusions are also new. Heart size is within normal limits. Dual lead transvenous pacemaker remains in appropriate position.  IMPRESSION: New bibasilar atelectasis versus infiltrates and small pleural effusions.   Electronically Signed   By: Myles Rosenthal   On: 10/12/2012 12:05    CBC  Recent Labs Lab 10/12/12 1051 10/12/12 1054 10/13/12 0350  WBC 11.3*  --  9.7  HGB 13.8 15.3* 12.9  HCT 38.9 45.0 37.5  PLT 323  --  285  MCV 89.0  --  89.5  MCH 31.6  --  30.8  MCHC 35.5  --  34.4  RDW 13.6  --  13.7  LYMPHSABS 1.0  --   --   MONOABS 0.8  --   --   EOSABS 0.1  --   --   BASOSABS 0.0  --   --     Chemistries   Recent Labs Lab 10/12/12 1051 10/12/12 1054 10/13/12 0350 10/14/12 0500 10/15/12 0500  NA 123* 126* 131* 132* 133*  K 5.3* 5.3* 3.3* 4.0 3.4*  CL 90* 94* 92* 94* 95*  CO2 19  --  24 27 27   GLUCOSE 209* 220* 127* 130* 143*  BUN 15 17 14 16 15   CREATININE 1.07 1.10 0.95 1.12* 0.84  CALCIUM 9.2  --  8.7 8.5 8.5  MG  --   --   --  1.2* 1.6  AST 15  --   --   --   --   ALT 10  --   --   --   --   ALKPHOS 52  --   --   --   --   BILITOT 0.7  --   --   --   --    ------------------------------------------------------------------------------------------------------------------ estimated creatinine clearance is 45.6 ml/min (by C-G formula based on Cr of  0.84). ------------------------------------------------------------------------------------------------------------------ No results found for this basename: HGBA1C,  in the last 72 hours ------------------------------------------------------------------------------------------------------------------ No results found for this basename: CHOL, HDL, LDLCALC, TRIG, CHOLHDL, LDLDIRECT,  in the last 72 hours ------------------------------------------------------------------------------------------------------------------  Recent Labs  10/13/12 0905  TSH 1.409   ------------------------------------------------------------------------------------------------------------------ No results found for this basename: VITAMINB12, FOLATE, FERRITIN, TIBC, IRON, RETICCTPCT,  in the last 72 hours  Coagulation profile No results found for this basename: INR, PROTIME,  in the last 168 hours  No results found for this basename: DDIMER,  in the last 72 hours  Cardiac Enzymes  Recent Labs Lab 10/12/12 1742 10/12/12 2242 10/13/12 0350  TROPONINI <0.30 <0.30 <0.30   ------------------------------------------------------------------------------------------------------------------ No components found with this basename: POCBNP,

## 2012-10-16 ENCOUNTER — Encounter (HOSPITAL_COMMUNITY): Payer: Self-pay | Admitting: Internal Medicine

## 2012-10-16 LAB — BASIC METABOLIC PANEL
BUN: 11 mg/dL (ref 6–23)
Calcium: 8.7 mg/dL (ref 8.4–10.5)
GFR calc Af Amer: 86 mL/min — ABNORMAL LOW (ref >90)
GFR calc non Af Amer: 74 mL/min — ABNORMAL LOW (ref >90)
Glucose, Bld: 130 mg/dL — ABNORMAL HIGH (ref 70–99)
Potassium: 4 mEq/L (ref 3.5–5.1)

## 2012-10-16 LAB — GLUCOSE, CAPILLARY: Glucose-Capillary: 138 mg/dL — ABNORMAL HIGH (ref 70–99)

## 2012-10-16 MED ORDER — FUROSEMIDE 20 MG PO TABS: 20.0000 mg | ORAL_TABLET | Freq: Every day | ORAL | Status: DC

## 2012-10-16 MED ORDER — LISINOPRIL 10 MG PO TABS: 10.0000 mg | ORAL_TABLET | Freq: Every day | ORAL | Status: DC

## 2012-10-16 NOTE — Progress Notes (Signed)
Subjective:  Laying flat, no SOB, no CP. Daughter in room  Objective:  Vital Signs in the last 24 hours: Temp:  [97.5 F (36.4 C)-97.9 F (36.6 C)] 97.8 F (36.6 C) (08/20 0522) Pulse Rate:  [67-86] 70 (08/20 0522) Resp:  [18] 18 (08/20 0522) BP: (128-154)/(45-80) 139/45 mmHg (08/20 0522) SpO2:  [96 %-98 %] 96 % (08/20 0522) Weight:  [90.5 kg (199 lb 8.3 oz)] 90.5 kg (199 lb 8.3 oz) (08/20 0522)  Intake/Output from previous day: 08/19 0701 - 08/20 0700 In: 783 [P.O.:680; I.V.:3; IV Piggyback:100] Out: 227 [Urine:226; Stool:1]   Physical Exam: General:Elderly  Well developed, well nourished, in no acute distress. Head:  Normocephalic and atraumatic. Lungs: Clear to auscultation and percussion. Heart: Normal S1 and S2.  No murmur, rubs or gallops.  Abdomen: soft, non-tender, positive bowel sounds. Extremities: No clubbing or cyanosis. No edema. Neurologic: Alert and oriented x 3.    Lab Results: No results found for this basename: WBC, HGB, PLT,  in the last 72 hours  Recent Labs  10/15/12 1420 10/16/12 0500  NA 133* 134*  K 5.2* 4.0  CL 96 96  CO2 27 28  GLUCOSE 190* 130*  BUN 13 11  CREATININE 0.84 0.69    Telemetry: NSR Personally viewed.   EKG:  Qtc Assessment/Plan:  Principal Problem:   Acute combined systolic and diastolic congestive heart failure Active Problems:   Hyponatremia   A-fib   Diabetes   Hyperlipidemia  1) AFIB  - PAF  - Agree with holding sotalol given decreased creatinine clearance. Age 79. If restart, 80mg  PO QD may be a sufficient dose.  - Dr. Eldridge Dace to see in clinic, ECG.  - Will get appt.   2) Diastolic HF  - euvolemic.   - doing well  - agree with lasix PRN.  3) PACER  - functioning well.   OK with discharge.   [name] 10/16/2012, 11:00 AM

## 2012-10-16 NOTE — Discharge Summary (Signed)
[facility]                                                                                   [name], is a 79 y.o. female  DOB February 13, 1919  MRN [id number].  Admission date:  10/12/2012  Discharge Date:  10/16/2012  Primary MD  [name], MD  Admitting Physician  Maretta Bees, MD  Admission Diagnosis  Hyperkalemia [276.7] CHF (congestive heart failure) [428.0] Hyponatremia [276.1] Atypical chest pain [786.59]  Discharge Diagnosis     Principal Problem:   Acute combined systolic and diastolic congestive heart failure Active Problems:   Hyponatremia   A-fib   Diabetes   Hyperlipidemia      Past Medical History  Diagnosis Date  . Diabetes mellitus   . Hypertension   . Hyperlipidemia   . Arthritis   . AF (atrial fibrillation)   . Cataract     decreased vision in left eye  . Peripheral vascular disease     Past Surgical History  Procedure Laterality Date  . Pacemaker insertion  2008  . Cholecystectomy    . Thyroid surgery       Recommendations for primary care physician for things to follow:   Follow weight, BMP and diuretic dose closely   Discharge Diagnoses:   Principal Problem:   Acute combined systolic and diastolic congestive heart failure Active Problems:   Hyponatremia   A-fib   Diabetes   Hyperlipidemia    Discharge Condition: Stable   Diet recommendation: See Discharge Instructions below   Consults Cards    History of present illness and  Hospital Course:     Kindly see H&P for history of present illness and admission details, please review complete Labs, Consult reports and Test reports for all details in brief [name], is a 79 y.o. female, patient with history of chronic systolic and diastolic heart failure, type 2 diabetes mellitus who was in good state of health was apparent to the hospital for acute or chronic diastolic CHF caused by excessive fluid intake, she is now compensated after initial diuresis,  requires low-dose Lasix along with fluid and salt restriction, she has long-standing history of atrial fibrillation and was on sotalol, she was ready for discharge however her QTC has been noted to be quite high, cardiology has been consulted and they have holding her sotalol till QTC 4 spell of 500 and sotalol can be started at a lower dose.      CHF, acute on chronic from diastolic and Systolic  She was recently diagnosed with UTI and was asked to drink plenty of fluids which she was doing, has likely put her in fluid overload. She has been diuresed with Lasix and now she is compensated, she will be managed on fluid and salt restriction and low dose Lasix, currently she appears euvolemic. On aspirin and statin, she will be placed on low-dose ACE inhibitor less than her home dose giving room for Lasix, her sotalol has been held as her QTC was over 500 plan is for her to see a cardiologist in the office in the next 3-4 days and get sotalol readdressed. QTC today was 503 he did  Echo  - Procedure narrative: Transthoracic echocardiography. Image quality was adequate. The study was technically difficult.  - Left ventricle: Distal septal and posterior basal wall hypokinesis The cavity size was normal. Wall thickness was increased in a pattern of mild LVH. Systolic function was mildly reduced. The estimated ejection fraction was in the range of 45% to 50%. - Aortic valve: Mild regurgitation.    Marland Kitchen A-fib (?sick sinus) she's not a long-term anticoagulant candidate due to her age, she was seen by cardiology today, discussed the case with Dr. Dorothyann Peng, plan is to go home on aspirin, no sotalol due to prolonged QTC which is gradually improving, she will see her cardiologist in the next few days and get a rate controlling medications and readdressed.     diabetes mellitus type 2 - stable she will resume her home regimen unchanged         Today   Subjective:   [name] today has no  headache,no chest abdominal pain,no new weakness tingling or numbness, feels much better wants to go home today.    Objective:   Blood pressure 139/45, pulse 70, temperature 97.8 F (36.6 C), temperature source Oral, resp. rate 18, height 5\' 2"  (1.575 m), weight 90.5 kg (199 lb 8.3 oz), SpO2 96.00%.   Intake/Output Summary (Last 24 hours) at 10/16/12 1246 Last data filed at 10/16/12 1106  Gross per 24 hour  Intake    803 ml  Output    377 ml  Net    426 ml    Exam Awake Alert, Oriented *3, No new F.N deficits, Normal affect [place].AT,PERRAL Supple Neck,No JVD, No cervical lymphadenopathy appriciated.  Symmetrical Chest wall movement, Good air movement bilaterally, CTAB RRR,No Gallops,Rubs or new Murmurs, No Parasternal Heave +ve B.Sounds, Abd Soft, Non tender, No organomegaly appriciated, No rebound -guarding or rigidity. No Cyanosis, Clubbing or edema, No new Rash or bruise  Data Review   Major procedures and Radiology Reports - PLEASE review detailed and final reports for all details, in brief -       Dg Chest 2 View  10/12/2012   CLINICAL DATA:  Shortness of Breath. Fatigue.  EXAM: CHEST  2 VIEW  COMPARISON:  03/17/2007  FINDINGS: New bibasilar pulmonary opacity is seen which may be due to atelectasis or infiltrates. Small pleural effusions are also new. Heart size is within normal limits. Dual lead transvenous pacemaker remains in appropriate position.  IMPRESSION: New bibasilar atelectasis versus infiltrates and small pleural effusions.   Electronically Signed   By: Myles Rosenthal   On: 10/12/2012 12:05    Micro Results      Recent Results (from the past 240 hour(s))  CLOSTRIDIUM DIFFICILE BY PCR     Status: None   Collection Time    10/13/12 12:06 PM      Result Value Range Status   C difficile by pcr NEGATIVE  NEGATIVE Final     CBC w Diff: Lab Results  Component Value Date   WBC 9.7 10/13/2012   HGB 12.9 10/13/2012   HCT 37.5 10/13/2012   PLT 285 10/13/2012    LYMPHOPCT 9* 10/12/2012   MONOPCT 7 10/12/2012   EOSPCT 1 10/12/2012   BASOPCT 0 10/12/2012    CMP: Lab Results  Component Value Date   NA 134* 10/16/2012   K 4.0 10/16/2012   CL 96 10/16/2012   CO2 28 10/16/2012   BUN 11 10/16/2012   CREATININE 0.69 10/16/2012   PROT 5.9* 10/12/2012   ALBUMIN  3.3* 10/12/2012   BILITOT 0.7 10/12/2012   ALKPHOS 52 10/12/2012   AST 15 10/12/2012   ALT 10 10/12/2012  .   Discharge Instructions     Follow with Primary MD FULP, CAMMIE, MD in 4 days   Get CBC, CMP, checked 4 days by Primary MD and again as instructed by your Primary MD.    Get Medicines reviewed and adjusted.  Please request your Prim.MD to go over all Hospital Tests and Procedure/Radiological results at the follow up, please get all Hospital records sent to your Prim MD by signing hospital release before you go home.  Activity: As tolerated with Full fall precautions use walker/cane & assistance as needed   Diet:  Heart Healthy-Low carb,  Fluid restriction 1.5 lit/day, Aspiration precautions.  For Heart failure patients - Check your Weight same time everyday, if you gain over 2 pounds, or you develop in leg swelling, experience more shortness of breath or chest pain, call your Primary MD immediately. Follow Cardiac Low Salt Diet and 1.5 lit/day fluid restriction.  Disposition Home   If you experience worsening of your admission symptoms, develop shortness of breath, life threatening emergency, suicidal or homicidal thoughts you must seek medical attention immediately by calling 911 or calling your MD immediately  if symptoms less severe.  You Must read complete instructions/literature along with all the possible adverse reactions/side effects for all the Medicines you take and that have been prescribed to you. Take any new Medicines after you have completely understood and accpet all the possible adverse reactions/side effects.   Do not drive and provide baby sitting services if your were  admitted for syncope or siezures until you have seen by Primary MD or a Neurologist and advised to do so again.  Do not drive when taking Pain medications.    Do not take more than prescribed Pain, Sleep and Anxiety Medications  Special Instructions: If you have smoked or chewed Tobacco  in the last 2 yrs please stop smoking, stop any regular Alcohol  and or any Recreational drug use.  Wear Seat belts while driving.   Please note  You were cared for by a hospitalist during your hospital stay. If you have any questions about your discharge medications or the care you received while you were in the hospital after you are discharged, you can call the unit and asked to speak with the hospitalist on call if the hospitalist that took care of you is not available. Once you are discharged, your primary care physician will handle any further medical issues. Please note that NO REFILLS for any discharge medications will be authorized once you are discharged, as it is imperative that you return to your primary care physician (or establish a relationship with a primary care physician if you do not have one) for your aftercare needs so that they can reassess your need for medications and monitor your lab values.       Follow-up Information   Follow up with Corky Crafts., MD On 10/21/2012. (11:30am)    Specialty:  Cardiology   Contact information:   [address] [place] [address] [phone]       Follow up with FULP, CAMMIE, MD. Schedule an appointment as soon as possible for a visit in 4 days.   Specialty:  Family Medicine   Contact information:   [address] [place], [place] [address] [place] [address] [phone]         Discharge Medications  Medication List    STOP taking these medications       cephALEXin 500 MG capsule  Commonly known as:  KEFLEX     sodium chloride 1 G tablet     SOTALOL AF 80 MG Tabs     triamterene-hydrochlorothiazide 37.5-25  MG per tablet  Commonly known as:  MAXZIDE-25      TAKE these medications       aspirin 81 MG tablet  Take 160 mg by mouth daily.     beta carotene w/minerals tablet  Take 1 tablet by mouth daily.     cyanocobalamin 500 MCG tablet  Take 500 mcg by mouth every morning.     furosemide 20 MG tablet  Commonly known as:  LASIX  Take 1 tablet (20 mg total) by mouth daily.     lisinopril 10 MG tablet  Commonly known as:  PRINIVIL,ZESTRIL  Take 1 tablet (10 mg total) by mouth daily.     metFORMIN 500 MG 24 hr tablet  Commonly known as:  GLUCOPHAGE-XR  Take 500 mg by mouth 3 (three) times daily.     ondansetron 4 MG tablet  Commonly known as:  ZOFRAN  Take 4 mg by mouth every 8 (eight) hours as needed for nausea.     simvastatin 40 MG tablet  Commonly known as:  ZOCOR  Take 40 mg by mouth every evening.           Total Time in preparing paper work, data evaluation and todays exam - 35 minutes  Leroy Sea M.D on 10/16/2012 at 12:46 PM  [facility] Office  [phone]

## 2012-10-16 NOTE — Progress Notes (Signed)
Pt has received discharge instructions. RN answered all questions that the patient and daughter had. Pt is going to be transported by her daughter to her residence.

## 2012-10-16 NOTE — Progress Notes (Signed)
Emotional support was given to the patient last night and she received a Xanax at 2050.  The patient stated that she slept well and that she felt "ok" this morning.  Her EKG was placed in the chart.

## 2012-10-16 NOTE — Progress Notes (Signed)
Pt is alert and oriented x4. Pts daughter is at bedside. Pt is on RA. No complaints of pain at this time.

## 2012-10-16 NOTE — Progress Notes (Signed)
[facility]                                                                                   [name], is a 79 y.o. female  DOB 1918-11-05  MRN [id number].  Admission date:  10/12/2012  Discharge Date:  10/16/2012  Primary MD  [name], MD  Admitting Physician  Maretta Bees, MD  Admission Diagnosis  Hyperkalemia [276.7] CHF (congestive heart failure) [428.0] Hyponatremia [276.1] Atypical chest pain [786.59]  Discharge Diagnosis     Principal Problem:   Acute combined systolic and diastolic congestive heart failure Active Problems:   Hyponatremia   A-fib   Diabetes   Hyperlipidemia      Past Medical History  Diagnosis Date  . Diabetes mellitus   . Hypertension   . Hyperlipidemia   . Arthritis   . AF (atrial fibrillation)   . Cataract     decreased vision in left eye  . Peripheral vascular disease     Past Surgical History  Procedure Laterality Date  . Pacemaker insertion  2008  . Cholecystectomy    . Thyroid surgery       Recommendations for primary care physician for things to follow:   Follow weight, BMP and diuretic dose closely   Discharge Diagnoses:   Principal Problem:   Acute combined systolic and diastolic congestive heart failure Active Problems:   Hyponatremia   A-fib   Diabetes   Hyperlipidemia    Discharge Condition: Stable   Diet recommendation: See Discharge Instructions below   Consults Cards    History of present illness and  Hospital Course:     Kindly see H&P for history of present illness and admission details, please review complete Labs, Consult reports and Test reports for all details in brief [name], is a 79 y.o. female, patient with history of chronic systolic and diastolic heart failure, type 2 diabetes mellitus who was in good state of health was apparent to the hospital for acute or chronic diastolic CHF caused by excessive fluid intake, she is now compensated after initial diuresis,  requires low-dose Lasix along with fluid and salt restriction, she has long-standing history of atrial fibrillation and was on sotalol, she was ready for discharge however her QTC has been noted to be quite high, cardiology has been consulted and they have holding her sotalol till QTC 4 spell of 500 and sotalol can be started at a lower dose.      CHF, acute on chronic from diastolic and Systolic  She was recently diagnosed with UTI and was asked to drink plenty of fluids which she was doing, has likely put her in fluid overload. She has been diuresed with Lasix and now she is compensated, she will be managed on fluid and salt restriction and low dose Lasix, currently she appears euvolemic. On aspirin and statin, she will be placed on low-dose ACE inhibitor less than her home dose giving room for Lasix, her sotalol has been held as her QTC was over 500 plan is for her to see a cardiologist in the office in the next 3-4 days and get sotalol readdressed. QTC today was 503 he did  Echo  - Procedure narrative: Transthoracic echocardiography. Image quality was adequate. The study was technically difficult.  - Left ventricle: Distal septal and posterior basal wall hypokinesis The cavity size was normal. Wall thickness was increased in a pattern of mild LVH. Systolic function was mildly reduced. The estimated ejection fraction was in the range of 45% to 50%. - Aortic valve: Mild regurgitation.    Marland Kitchen A-fib (?sick sinus) she's not a long-term anticoagulant candidate due to her age, she was seen by cardiology today, discussed the case with Dr. Dorothyann Peng, plan is to go home on aspirin, no sotalol due to prolonged QTC which is gradually improving, she will see her cardiologist in the next few days and get a rate controlling medications and readdressed.     diabetes mellitus type 2 - stable she will resume her home regimen unchanged         Today   Subjective:   [name] today has no  headache,no chest abdominal pain,no new weakness tingling or numbness, feels much better wants to go home today.    Objective:   Blood pressure 139/45, pulse 70, temperature 97.8 F (36.6 C), temperature source Oral, resp. rate 18, height 5\' 2"  (1.575 m), weight 90.5 kg (199 lb 8.3 oz), SpO2 96.00%.   Intake/Output Summary (Last 24 hours) at 10/16/12 1133 Last data filed at 10/16/12 1106  Gross per 24 hour  Intake    803 ml  Output    377 ml  Net    426 ml    Exam Awake Alert, Oriented *3, No new F.N deficits, Normal affect [place].AT,PERRAL Supple Neck,No JVD, No cervical lymphadenopathy appriciated.  Symmetrical Chest wall movement, Good air movement bilaterally, CTAB RRR,No Gallops,Rubs or new Murmurs, No Parasternal Heave +ve B.Sounds, Abd Soft, Non tender, No organomegaly appriciated, No rebound -guarding or rigidity. No Cyanosis, Clubbing or edema, No new Rash or bruise  Data Review   Major procedures and Radiology Reports - PLEASE review detailed and final reports for all details, in brief -       Dg Chest 2 View  10/12/2012   CLINICAL DATA:  Shortness of Breath. Fatigue.  EXAM: CHEST  2 VIEW  COMPARISON:  03/17/2007  FINDINGS: New bibasilar pulmonary opacity is seen which may be due to atelectasis or infiltrates. Small pleural effusions are also new. Heart size is within normal limits. Dual lead transvenous pacemaker remains in appropriate position.  IMPRESSION: New bibasilar atelectasis versus infiltrates and small pleural effusions.   Electronically Signed   By: Myles Rosenthal   On: 10/12/2012 12:05    Micro Results      Recent Results (from the past 240 hour(s))  CLOSTRIDIUM DIFFICILE BY PCR     Status: None   Collection Time    10/13/12 12:06 PM      Result Value Range Status   C difficile by pcr NEGATIVE  NEGATIVE Final     CBC w Diff: Lab Results  Component Value Date   WBC 9.7 10/13/2012   HGB 12.9 10/13/2012   HCT 37.5 10/13/2012   PLT 285 10/13/2012    LYMPHOPCT 9* 10/12/2012   MONOPCT 7 10/12/2012   EOSPCT 1 10/12/2012   BASOPCT 0 10/12/2012    CMP: Lab Results  Component Value Date   NA 134* 10/16/2012   K 4.0 10/16/2012   CL 96 10/16/2012   CO2 28 10/16/2012   BUN 11 10/16/2012   CREATININE 0.69 10/16/2012   PROT 5.9* 10/12/2012   ALBUMIN  3.3* 10/12/2012   BILITOT 0.7 10/12/2012   ALKPHOS 52 10/12/2012   AST 15 10/12/2012   ALT 10 10/12/2012  .   Discharge Instructions     Follow with Primary MD FULP, CAMMIE, MD in 4 days   Get CBC, CMP, checked 4 days by Primary MD and again as instructed by your Primary MD.    Get Medicines reviewed and adjusted.  Please request your Prim.MD to go over all Hospital Tests and Procedure/Radiological results at the follow up, please get all Hospital records sent to your Prim MD by signing hospital release before you go home.  Activity: As tolerated with Full fall precautions use walker/cane & assistance as needed   Diet:  Heart Healthy-Low carb,  Fluid restriction 1.5 lit/day, Aspiration precautions.  For Heart failure patients - Check your Weight same time everyday, if you gain over 2 pounds, or you develop in leg swelling, experience more shortness of breath or chest pain, call your Primary MD immediately. Follow Cardiac Low Salt Diet and 1.5 lit/day fluid restriction.  Disposition Home   If you experience worsening of your admission symptoms, develop shortness of breath, life threatening emergency, suicidal or homicidal thoughts you must seek medical attention immediately by calling 911 or calling your MD immediately  if symptoms less severe.  You Must read complete instructions/literature along with all the possible adverse reactions/side effects for all the Medicines you take and that have been prescribed to you. Take any new Medicines after you have completely understood and accpet all the possible adverse reactions/side effects.   Do not drive and provide baby sitting services if your were  admitted for syncope or siezures until you have seen by Primary MD or a Neurologist and advised to do so again.  Do not drive when taking Pain medications.    Do not take more than prescribed Pain, Sleep and Anxiety Medications  Special Instructions: If you have smoked or chewed Tobacco  in the last 2 yrs please stop smoking, stop any regular Alcohol  and or any Recreational drug use.  Wear Seat belts while driving.   Please note  You were cared for by a hospitalist during your hospital stay. If you have any questions about your discharge medications or the care you received while you were in the hospital after you are discharged, you can call the unit and asked to speak with the hospitalist on call if the hospitalist that took care of you is not available. Once you are discharged, your primary care physician will handle any further medical issues. Please note that NO REFILLS for any discharge medications will be authorized once you are discharged, as it is imperative that you return to your primary care physician (or establish a relationship with a primary care physician if you do not have one) for your aftercare needs so that they can reassess your need for medications and monitor your lab values.   Follow-up Information   Follow up with Corky Crafts., MD On 10/21/2012. (11:30am)    Specialty:  Cardiology   Contact information:   [address] [place] [address] [phone]       Follow up with FULP, CAMMIE, MD. Schedule an appointment as soon as possible for a visit in 4 days.   Specialty:  Family Medicine   Contact information:   [address] [place], [place] [address] [place] [address] [phone]         Discharge Medications     Medication List  STOP taking these medications       cephALEXin 500 MG capsule  Commonly known as:  KEFLEX     SOTALOL AF 80 MG Tabs     triamterene-hydrochlorothiazide 37.5-25 MG per tablet  Commonly known as:   MAXZIDE-25      TAKE these medications       aspirin 81 MG tablet  Take 160 mg by mouth daily.     beta carotene w/minerals tablet  Take 1 tablet by mouth daily.     cyanocobalamin 500 MCG tablet  Take 500 mcg by mouth every morning.     furosemide 20 MG tablet  Commonly known as:  LASIX  Take 1 tablet (20 mg total) by mouth daily.     lisinopril 10 MG tablet  Commonly known as:  PRINIVIL,ZESTRIL  Take 1 tablet (10 mg total) by mouth daily.     metFORMIN 500 MG 24 hr tablet  Commonly known as:  GLUCOPHAGE-XR  Take 500 mg by mouth 3 (three) times daily.     ondansetron 4 MG tablet  Commonly known as:  ZOFRAN  Take 4 mg by mouth every 8 (eight) hours as needed for nausea.     simvastatin 40 MG tablet  Commonly known as:  ZOCOR  Take 40 mg by mouth every evening.     sodium chloride 1 G tablet  Take 1 g by mouth daily.           Total Time in preparing paper work, data evaluation and todays exam - 35 minutes  Leroy Sea M.D on 10/16/2012 at 11:33 AM  [facility] Office  [phone]

## 2012-10-21 NOTE — Progress Notes (Signed)
Physical Therapy Treatment Patient Details Name: [name] MRN: [id number] DOB: Dec 04, 1918 Today's Date: 10/14/2012 Time: 7829-5621 PT Time Calculation (min): 28 min  PT Assessment / Plan / Recommendation  History of Present Illness 79 y.o. female with a Past Medical History of sick sinus syndrome status post permanent pacemaker placement, atrial fibrillation, hypertension who presents today with the above noted complaint. Per patient, she has not been her usual self for the past 2 weeks, on 7/30 she presented to the emergency room for "weakness", she was evaluated and thought to be dehydrated, she received some fluids and then was discharged.For the past 5-7 days, patient has had dyspnea, exclusively on exertion, this has slowly worsened. For the past 2 days even simply walking to the bathroom makes her short of breath. She also has noticed worsening bilateral lower extremity edema. She presented to the emergency room with these complaints, she was found to have worsening of her hyponatremia and a significantly elevated BNP level. Her symptoms were thought to be secondary to new onset CHF, I was subsequently asked to admit this patient for further evaluation and treatment.   PT Comments   Pt willing to participate in therapy.  Conts to be limited by generalized weakness & decreased activity tolerance.  Pt's daughter present entire session & states she will be staying with the patient for couple of days initially.     Follow Up Recommendations  Home health PT;Supervision/Assistance - 24 hour;SNF     Does the patient have the potential to tolerate intense rehabilitation     Barriers to Discharge        Equipment Recommendations  3in1 (PT)    Recommendations for Other Services    Frequency Min 3X/week   Progress towards PT Goals Progress towards PT goals: Progressing toward goals  Plan Current plan remains appropriate    Precautions / Restrictions Precautions Precautions:  Fall Restrictions Weight Bearing Restrictions: No   Pertinent Vitals/Pain No pain reported.     Mobility  Bed Mobility Bed Mobility: Supine to Sit;Sitting - Scoot to Edge of Bed Supine to Sit: 5: Supervision;HOB flat;With rails Sitting - Scoot to Edge of Bed: 5: Supervision Details for Bed Mobility Assistance: incr time Transfers Transfers: Sit to Stand;Stand to Sit Sit to Stand: 4: Min assist;With upper extremity assist;From bed Stand to Sit: 4: Min assist;With upper extremity assist;With armrests;To chair/3-in-1 Details for Transfer Assistance: cues for hand placement & body positioning before sitting down.  (A) to achieve standing, balance, & controlled descent.  Ambulation/Gait Ambulation/Gait Assistance: 4: Min guard Ambulation Distance (Feet): 40 Feet Assistive device: Rolling walker Ambulation/Gait Assistance Details: (A) for balance & safety.  Cues for posture & safety with RW due to pt wanting to lean forwards with forearms/elbows on RW.  Fwd flexed posture & small shuffling steps.  Gait Pattern: Step-through pattern;Decreased stride length;Trunk flexed;Shuffle Gait velocity: slow Stairs: No Wheelchair Mobility Wheelchair Mobility: No    Exercises     PT Diagnosis:    PT Problem List:   PT Treatment Interventions:     PT Goals (current goals can now be found in the care plan section) Acute Rehab PT Goals PT Goal Formulation: With patient/family Time For Goal Achievement: 10/20/12 Potential to Achieve Goals: Good  Visit Information  Last PT Received On: 10/14/12 Assistance Needed: +1 History of Present Illness: 79 y.o. female with a Past Medical History of sick sinus syndrome status post permanent pacemaker placement, atrial fibrillation, hypertension who presents today with the above noted complaint. Per  patient, she has not been her usual self for the past 2 weeks, on 7/30 she presented to the emergency room for "weakness", she was evaluated and thought to be  dehydrated, she received some fluids and then was discharged.For the past 5-7 days, patient has had dyspnea, exclusively on exertion, this has slowly worsened. For the past 2 days even simply walking to the bathroom makes her short of breath. She also has noticed worsening bilateral lower extremity edema. She presented to the emergency room with these complaints, she was found to have worsening of her hyponatremia and a significantly elevated BNP level. Her symptoms were thought to be secondary to new onset CHF, I was subsequently asked to admit this patient for further evaluation and treatment.    Subjective Data      Cognition  Cognition Arousal/Alertness: Awake/alert Behavior During Therapy: WFL for tasks assessed/performed Overall Cognitive Status: Within Functional Limits for tasks assessed    Balance     End of Session PT - End of Session Equipment Utilized During Treatment: Gait belt Activity Tolerance: Patient tolerated treatment well Patient left: in chair;with call bell/phone within reach;with family/visitor present Nurse Communication: Mobility status   GP     [name] 10/14/2012, 2:36 PM   [name], PTA [phone] 10/14/2012  Agree with above assessment.  Lewis Shock, PT, DPT Pager #: [phone] Office #: [phone]

## 2012-10-26 ENCOUNTER — Inpatient Hospital Stay (HOSPITAL_COMMUNITY)
Admission: EM | Admit: 2012-10-26 | Discharge: 2012-10-31 | DRG: 061 | Disposition: A | Discharge status: Rehabilitation Facility | Payer: Medicare Other | Attending: Internal Medicine | Admitting: Internal Medicine

## 2012-10-26 ENCOUNTER — Inpatient Hospital Stay (HOSPITAL_COMMUNITY): Payer: Medicare Other

## 2012-10-26 ENCOUNTER — Emergency Department (HOSPITAL_COMMUNITY): Payer: Medicare Other

## 2012-10-26 ENCOUNTER — Encounter (HOSPITAL_COMMUNITY): Payer: Self-pay | Admitting: Emergency Medicine

## 2012-10-26 DIAGNOSIS — E873 Alkalosis: Secondary | ICD-10-CM | POA: Diagnosis not present

## 2012-10-26 DIAGNOSIS — I6350 Cerebral infarction due to unspecified occlusion or stenosis of unspecified cerebral artery: Secondary | ICD-10-CM

## 2012-10-26 DIAGNOSIS — I10 Essential (primary) hypertension: Secondary | ICD-10-CM | POA: Diagnosis present

## 2012-10-26 DIAGNOSIS — I6340 Cerebral infarction due to embolism of unspecified cerebral artery: Principal | ICD-10-CM | POA: Diagnosis present

## 2012-10-26 DIAGNOSIS — R414 Neurologic neglect syndrome: Secondary | ICD-10-CM | POA: Diagnosis present

## 2012-10-26 DIAGNOSIS — I4891 Unspecified atrial fibrillation: Secondary | ICD-10-CM | POA: Diagnosis present

## 2012-10-26 DIAGNOSIS — G9340 Encephalopathy, unspecified: Secondary | ICD-10-CM | POA: Diagnosis present

## 2012-10-26 DIAGNOSIS — I70269 Atherosclerosis of native arteries of extremities with gangrene, unspecified extremity: Secondary | ICD-10-CM

## 2012-10-26 DIAGNOSIS — I5033 Acute on chronic diastolic (congestive) heart failure: Secondary | ICD-10-CM | POA: Diagnosis present

## 2012-10-26 DIAGNOSIS — Z823 Family history of stroke: Secondary | ICD-10-CM

## 2012-10-26 DIAGNOSIS — S069X0A Unspecified intracranial injury without loss of consciousness, initial encounter: Secondary | ICD-10-CM

## 2012-10-26 DIAGNOSIS — E119 Type 2 diabetes mellitus without complications: Secondary | ICD-10-CM | POA: Diagnosis present

## 2012-10-26 DIAGNOSIS — I639 Cerebral infarction, unspecified: Secondary | ICD-10-CM

## 2012-10-26 DIAGNOSIS — Z950 Presence of cardiac pacemaker: Secondary | ICD-10-CM

## 2012-10-26 DIAGNOSIS — S06890A Other specified intracranial injury without loss of consciousness, initial encounter: Secondary | ICD-10-CM

## 2012-10-26 DIAGNOSIS — M129 Arthropathy, unspecified: Secondary | ICD-10-CM | POA: Diagnosis present

## 2012-10-26 DIAGNOSIS — E785 Hyperlipidemia, unspecified: Secondary | ICD-10-CM | POA: Diagnosis present

## 2012-10-26 DIAGNOSIS — I739 Peripheral vascular disease, unspecified: Secondary | ICD-10-CM | POA: Diagnosis present

## 2012-10-26 DIAGNOSIS — M7989 Other specified soft tissue disorders: Secondary | ICD-10-CM

## 2012-10-26 DIAGNOSIS — R569 Unspecified convulsions: Secondary | ICD-10-CM | POA: Diagnosis not present

## 2012-10-26 DIAGNOSIS — E876 Hypokalemia: Secondary | ICD-10-CM | POA: Diagnosis present

## 2012-10-26 DIAGNOSIS — J9600 Acute respiratory failure, unspecified whether with hypoxia or hypercapnia: Secondary | ICD-10-CM | POA: Diagnosis not present

## 2012-10-26 DIAGNOSIS — G8190 Hemiplegia, unspecified affecting unspecified side: Secondary | ICD-10-CM | POA: Diagnosis present

## 2012-10-26 DIAGNOSIS — I5041 Acute combined systolic (congestive) and diastolic (congestive) heart failure: Secondary | ICD-10-CM

## 2012-10-26 HISTORY — DX: Heart failure, unspecified: I50.9

## 2012-10-26 LAB — COMPREHENSIVE METABOLIC PANEL
ALT: 11 U/L (ref 0–35)
Alkaline Phosphatase: 50 U/L (ref 39–117)
BUN: 15 mg/dL (ref 6–23)
CO2: 27 mEq/L (ref 19–32)
Chloride: 92 mEq/L — ABNORMAL LOW (ref 96–112)
GFR calc Af Amer: 46 mL/min — ABNORMAL LOW (ref >90)
Glucose, Bld: 166 mg/dL — ABNORMAL HIGH (ref 70–99)
Potassium: 3.3 mEq/L — ABNORMAL LOW (ref 3.5–5.1)
Sodium: 134 mEq/L — ABNORMAL LOW (ref 135–145)
Total Bilirubin: 0.7 mg/dL (ref 0.3–1.2)
Total Protein: 6.5 g/dL (ref 6.0–8.3)

## 2012-10-26 LAB — POCT I-STAT 3, ART BLOOD GAS (G3+)
Acid-Base Excess: 8 mmol/L — ABNORMAL HIGH (ref 0.0–2.0)
O2 Saturation: 100 %
TCO2: 33 mmol/L (ref 0–100)
pCO2 arterial: 36.8 mmHg (ref 35.0–45.0)

## 2012-10-26 LAB — URINALYSIS, ROUTINE W REFLEX MICROSCOPIC
Glucose, UA: NEGATIVE mg/dL
Hgb urine dipstick: NEGATIVE
Ketones, ur: NEGATIVE mg/dL
Leukocytes, UA: NEGATIVE
pH: 5 (ref 5.0–8.0)

## 2012-10-26 LAB — POCT I-STAT, CHEM 8
BUN: 15 mg/dL (ref 6–23)
Calcium, Ion: 0.85 mmol/L — ABNORMAL LOW (ref 1.13–1.30)
Chloride: 92 mEq/L — ABNORMAL LOW (ref 96–112)

## 2012-10-26 LAB — APTT: aPTT: 29 seconds (ref 24–37)

## 2012-10-26 LAB — DIFFERENTIAL
Eosinophils Absolute: 0.2 10*3/uL (ref 0.0–0.7)
Lymphocytes Relative: 33 % (ref 12–46)
Lymphs Abs: 2.5 10*3/uL (ref 0.7–4.0)
Monocytes Relative: 11 % (ref 3–12)
Neutro Abs: 4 10*3/uL (ref 1.7–7.7)
Neutrophils Relative %: 53 % (ref 43–77)

## 2012-10-26 LAB — CBC
Hemoglobin: 13.3 g/dL (ref 12.0–15.0)
MCH: 30.6 pg (ref 26.0–34.0)
Platelets: 327 10*3/uL (ref 150–400)
RBC: 4.34 MIL/uL (ref 3.87–5.11)
WBC: 7.5 10*3/uL (ref 4.0–10.5)

## 2012-10-26 LAB — PROTIME-INR
INR: 1.07 (ref 0.00–1.49)
Prothrombin Time: 13.7 seconds (ref 11.6–15.2)

## 2012-10-26 LAB — TROPONIN I: Troponin I: <0.3 ng/mL (ref <0.30)

## 2012-10-26 LAB — GLUCOSE, CAPILLARY: Glucose-Capillary: 139 mg/dL — ABNORMAL HIGH (ref 70–99)

## 2012-10-26 LAB — RAPID URINE DRUG SCREEN, HOSP PERFORMED
Benzodiazepines: NOT DETECTED
Cocaine: NOT DETECTED
Opiates: NOT DETECTED

## 2012-10-26 LAB — POCT I-STAT TROPONIN I: Troponin i, poc: 0.06 ng/mL (ref 0.00–0.08)

## 2012-10-26 MED ORDER — PROPOFOL 10 MG/ML IV EMUL
5.0000 ug/kg/min | INTRAVENOUS | Status: DC
  Administered 2012-10-26: 20 ug/kg/min via INTRAVENOUS
  Administered 2012-10-26 – 2012-10-27 (×2): 30 ug/kg/min via INTRAVENOUS
  Filled 2012-10-26 (×4): qty 100

## 2012-10-26 MED ORDER — POTASSIUM CHLORIDE 20 MEQ/15ML (10%) PO LIQD
ORAL | Status: AC
  Filled 2012-10-26: qty 30

## 2012-10-26 MED ORDER — BIOTENE DRY MOUTH MT LIQD
15.0000 mL | Freq: Four times a day (QID) | OROMUCOSAL | Status: DC
  Administered 2012-10-26 – 2012-10-27 (×3): 15 mL via OROMUCOSAL

## 2012-10-26 MED ORDER — CHLORHEXIDINE GLUCONATE 0.12 % MT SOLN
15.0000 mL | Freq: Two times a day (BID) | OROMUCOSAL | Status: DC
  Administered 2012-10-26 – 2012-10-27 (×2): 15 mL via OROMUCOSAL
  Filled 2012-10-26 (×2): qty 15

## 2012-10-26 MED ORDER — SODIUM CHLORIDE 0.9 % IV SOLN: 250.0000 mL | Freq: Once | INTRAVENOUS | Status: DC

## 2012-10-26 MED ORDER — POTASSIUM CHLORIDE 20 MEQ/15ML (10%) PO LIQD
40.0000 meq | Freq: Once | ORAL | Status: AC
  Administered 2012-10-26: 40 meq
  Filled 2012-10-26: qty 30

## 2012-10-26 MED ORDER — ALTEPLASE (STROKE) FULL DOSE INFUSION
0.9000 mg/kg | Freq: Once | INTRAVENOUS | Status: AC
  Administered 2012-10-26: 82 mg via INTRAVENOUS
  Filled 2012-10-26: qty 82

## 2012-10-26 MED ORDER — ACETAMINOPHEN 650 MG RE SUPP: 650.0000 mg | RECTAL | Status: DC | PRN

## 2012-10-26 MED ORDER — PANTOPRAZOLE SODIUM 40 MG IV SOLR
40.0000 mg | Freq: Every day | INTRAVENOUS | Status: DC
  Administered 2012-10-26 – 2012-10-27 (×2): 40 mg via INTRAVENOUS
  Filled 2012-10-26 (×3): qty 40

## 2012-10-26 MED ORDER — SENNOSIDES-DOCUSATE SODIUM 8.6-50 MG PO TABS
1.0000 | ORAL_TABLET | Freq: Every evening | ORAL | Status: DC | PRN
  Filled 2012-10-26: qty 1

## 2012-10-26 MED ORDER — INSULIN ASPART 100 UNIT/ML ~~LOC~~ SOLN
0.0000 [IU] | SUBCUTANEOUS | Status: DC
  Administered 2012-10-26 – 2012-10-27 (×4): 2 [IU] via SUBCUTANEOUS
  Administered 2012-10-28: 3 [IU] via SUBCUTANEOUS

## 2012-10-26 MED ORDER — FENTANYL CITRATE 0.05 MG/ML IJ SOLN
25.0000 ug | INTRAMUSCULAR | Status: DC | PRN
Start: 2012-10-26 — End: 2012-10-27
  Administered 2012-10-26: 100 ug via INTRAVENOUS
  Administered 2012-10-26: 50 ug via INTRAVENOUS
  Administered 2012-10-26: 100 ug via INTRAVENOUS
  Administered 2012-10-26: 50 ug via INTRAVENOUS
  Administered 2012-10-27: 100 ug via INTRAVENOUS
  Filled 2012-10-26 (×4): qty 2

## 2012-10-26 MED ORDER — MAGNESIUM SULFATE 40 MG/ML IJ SOLN
2.0000 g | Freq: Once | INTRAMUSCULAR | Status: AC
  Administered 2012-10-26: 2 g via INTRAVENOUS
  Filled 2012-10-26: qty 50

## 2012-10-26 MED ORDER — ETOMIDATE 2 MG/ML IV SOLN
20.0000 mg | Freq: Once | INTRAVENOUS | Status: AC
  Administered 2012-10-26: 20 mg via INTRAVENOUS

## 2012-10-26 MED ORDER — SODIUM CHLORIDE 0.9 % IV SOLN
INTRAVENOUS | Status: DC
  Administered 2012-10-26: 100 mL/h via INTRAVENOUS
  Administered 2012-10-26: 09:00:00 via INTRAVENOUS
  Administered 2012-10-27: 20 mL via INTRAVENOUS

## 2012-10-26 MED ORDER — POTASSIUM CHLORIDE 10 MEQ/100ML IV SOLN
10.0000 meq | INTRAVENOUS | Status: DC
  Filled 2012-10-26: qty 100

## 2012-10-26 MED ORDER — INSULIN ASPART 100 UNIT/ML ~~LOC~~ SOLN
0.0000 [IU] | Freq: Three times a day (TID) | SUBCUTANEOUS | Status: DC
Start: 2012-10-26 — End: 2012-10-26
  Administered 2012-10-26: 2 [IU] via SUBCUTANEOUS

## 2012-10-26 MED ORDER — ACETAMINOPHEN 325 MG PO TABS
650.0000 mg | ORAL_TABLET | ORAL | Status: DC | PRN
  Filled 2012-10-26: qty 2

## 2012-10-26 MED ORDER — MIDAZOLAM HCL 2 MG/2ML IJ SOLN
INTRAMUSCULAR | Status: AC
  Filled 2012-10-26: qty 4

## 2012-10-26 MED ORDER — LABETALOL HCL 5 MG/ML IV SOLN: 10.0000 mg | INTRAVENOUS | Status: DC | PRN

## 2012-10-26 NOTE — Progress Notes (Signed)
  Echocardiogram 2D Echocardiogram has been performed.  [name] 10/26/2012, 5:57 PM

## 2012-10-26 NOTE — Procedures (Signed)
Name: [name] MRN: [id number] DOB: 09/17/1918   PROCEDURE NOTE  Procedure:  Endotracheal intubation.  Indication:  Acute respiratory failure  Consent:  Consent was implied due to the emergency nature of the procedure.  Anesthesia:  A total of 10 mg of Etomidate was given intravenously.  Procedure summary:  Appropriate equipment was assembled. The patient was identified as [name] and safety timeout was performed. The patient was placed supine, with head in sniffing position. After adequate level of anesthesia was achieved, a Mac 3 blade was inserted into the oropharynx and the vocal cords were visualized. A 7.5 endotracheal tube was inserted without difficulty and visualized going through the vocal cords. The stylette was removed and cuff inflated. Colorimetric change was noted on the CO2 meter. Breath sounds were heard over both lung fields equally. ETT was secured at 23 cm lip line.  Post procedure chest xray was ordered.  Complications:  No immediate complications were noted.  Hemodynamic parameters and oxygenation remained stable throughout the procedure.    Orlean Bradford, M.D. Pulmonary and Critical Care Medicine [facility] Pager: [phone]  10/26/2012, 4:00 PM

## 2012-10-26 NOTE — Progress Notes (Signed)
VASCULAR LAB PRELIMINARY  PRELIMINARY  PRELIMINARY  PRELIMINARY  Carotid Dopplers completed.    Preliminary report:  Unable to adequately visualize bilateral ICAs or left vertebral secondary to movement.  Right vertebral artery flow is antegrade.  Saylor Sheckler, RVT 10/26/2012, 5:35 PM

## 2012-10-26 NOTE — Code Documentation (Signed)
Code stroke called at [phone], Patient arrived to [facility] via EMS at [phone], EDP seen at [phone], stroke team arrived at [phone], Patient arrived in CT at 0654, lab at [phone], pharmacy notified at [phone], TPA delivered at 49, TPA started at 0731, ICU bed requested at [phone].  LSN 0530, daughter spoke to patient at 0500, and at 40 daughter found patient with facial droop, left side weakness.  NIHSS 21.

## 2012-10-26 NOTE — ED Notes (Signed)
Ems called to house of Pt. Pt. Was last noted normal at 0530 this am. EMS found her to have slurred speech, left sided facial droop as well as left sided weakness. States that she was recently released from hospital for CHF last week.

## 2012-10-26 NOTE — Progress Notes (Signed)
Pt w/ sudden onset unresponsiveness, left forced gaze deviation, tonic-clonic movements of face & bilateral arms.  Dr. Thad Ranger notified.  Pt progressed to cyanosis, bagged on 100% O2, code blue called.  Rhythm changed to atrial paced, rate 65, ? Pulse, then to ST rate 120 with pulse.  See Code Blue sheet for further details.

## 2012-10-26 NOTE — Consult Note (Signed)
PULMONARY  / CRITICAL CARE MEDICINE  Name: [name] MRN: [id number] DOB: May 03, 1918    ADMISSION DATE:  10/26/2012 CONSULTATION DATE:  10/26/2012  REFERRING MD :  Thad Ranger   PRIMARY SERVICE: Stroke  CHIEF COMPLAINT:  Respiratory failure   BRIEF PATIENT DESCRIPTION:  79 yo admitted to Neuro ICU on 8/30 with acute right hemisphic CVA, s/p TPA with improved left sided weakness. Developed witnessed seizure, became hypoxic and required intubation.  SIGNIFICANT EVENTS / STUDIES:  8/30  Head CT >>> nad 8/30  Head Ct >>> 8/30  TTE >>> 8/30  Carotid Doppler >>>  LINES / TUBES: OETT 8/30 >>> OGT 8/30 >>> Foley 8/30 >>>  CULTURES:  ANTIBIOTICS:  The patient is encephalopathic and unable to provide history, which was obtained for available medical records.  HISTORY OF PRESENT ILLNESS:  79 yo admitted to Neuro ICU on 8/30 with acute right hemisphic CVA, s/p TPA with improved left sided weakness. Developed witnessed seizure, became hypoxic and required intubation.  PAST MEDICAL HISTORY :  Past Medical History  Diagnosis Date  . Diabetes mellitus   . Hypertension   . Hyperlipidemia   . Arthritis   . AF (atrial fibrillation)   . Cataract     decreased vision in left eye  . Peripheral vascular disease   . CHF (congestive heart failure)    Past Surgical History  Procedure Laterality Date  . Pacemaker insertion  2008  . Cholecystectomy    . Thyroid surgery     Prior to Admission medications   Medication Sig Start Date End Date Taking? Authorizing Provider  amiodarone (PACERONE) 400 MG tablet Take 400 mg by mouth daily.   Yes Historical Provider, MD  aspirin 81 MG tablet Take 160 mg by mouth daily.   Yes Historical Provider, MD  beta carotene w/minerals (OCUVITE) tablet Take 1 tablet by mouth daily.   Yes Historical Provider, MD  bumetanide (BUMEX) 1 MG tablet Take 1 mg by mouth daily.   Yes Historical Provider, MD  cyanocobalamin 500 MCG tablet Take 500 mcg by mouth every  morning.   Yes Historical Provider, MD  furosemide (LASIX) 20 MG tablet Take 1 tablet (20 mg total) by mouth daily. 10/16/12  Yes Leroy Sea, MD  lisinopril (PRINIVIL,ZESTRIL) 10 MG tablet Take 1 tablet (10 mg total) by mouth daily. 10/16/12  Yes Leroy Sea, MD  metFORMIN (GLUCOPHAGE-XR) 500 MG 24 hr tablet Take 500 mg by mouth 3 (three) times daily.    Yes Historical Provider, MD  metoprolol tartrate (LOPRESSOR) 25 MG tablet Take 25 mg by mouth 2 (two) times daily.   Yes Historical Provider, MD  ondansetron (ZOFRAN) 4 MG tablet Take 4 mg by mouth every 8 (eight) hours as needed for nausea.   Yes Historical Provider, MD  simvastatin (ZOCOR) 40 MG tablet Take 40 mg by mouth every evening.   Yes Historical Provider, MD   Allergies  Allergen Reactions  . Nitrofurantoin Nausea And Vomiting  . Sulfa Antibiotics Hives   FAMILY HISTORY:  No family history on file.  SOCIAL HISTORY:  reports that she has never smoked. She has never used smokeless tobacco. She reports that she does not drink alcohol or use illicit drugs.  REVIEW OF SYSTEMS:  Unable to obtain  SUBJECTIVE:   VITAL SIGNS: Temp:  [97.5 F (36.4 C)-97.6 F (36.4 C)] 97.6 F (36.4 C) (08/30 1329) Pulse Rate:  [64-77] 77 (08/30 1400) Resp:  [0-23] 14 (08/30 1400) BP: (103-169)/(40-80) 136/43 mmHg (08/30 1400)  SpO2:  [99 %-100 %] 100 % (08/30 1400) FiO2 (%):  [100 %] 100 % (08/30 1400) Weight:  [89 kg (196 lb 3.4 oz)-90.833 kg (200 lb 4 oz)] 89 kg (196 lb 3.4 oz) (08/30 0830)  HEMODYNAMICS:   VENTILATOR SETTINGS: Vent Mode:  [-] PRVC FiO2 (%):  [100 %] 100 % Set Rate:  [14 bmp] 14 bmp Vt Set:  [500 mL] 500 mL PEEP:  [5 cmH20] 5 cmH20  INTAKE / OUTPUT: Intake/Output     08/29 0701 - 08/30 0700 08/30 0701 - 08/31 0700   I.V. (mL/kg)  139.2 (1.6)   IV Piggyback  50   Total Intake(mL/kg)  189.2 (2.1)   Urine (mL/kg/hr)  865 (1.3)   Total Output   865   Net   -675.8         PHYSICAL EXAMINATION: General:   Sedated elderly female now on full vent support  Neuro:  Sedated, no f/c, + gag and cough, localizing  HEENT:  Orally intubated  Cardiovascular:  Regular irregular, no MRG Lungs:  Scattered rhonchi  Abdomen:+ bowel sounds no OM  Musculoskeletal:  Intact  Skin:  LE edema   LABS:  Recent Labs Lab 10/26/12 0650 10/26/12 0658  HGB 13.3 14.6  WBC 7.5  --   PLT 327  --   NA 134* 135  K 3.3* 3.2*  CL 92* 92*  CO2 27  --   GLUCOSE 166* 165*  BUN 15 15  CREATININE 1.17* 1.40*  CALCIUM 8.1*  --   AST 21  --   ALT 11  --   ALKPHOS 50  --   BILITOT 0.7  --   PROT 6.5  --   ALBUMIN 3.5  --   INR 1.07  --   APTT 29  --   TROPONINI <0.30  --     Recent Labs Lab 10/26/12 0718 10/26/12 1217 10/26/12 1359  GLUCAP 150* 126* 122*   CXR:  8/30 >>>  ASSESSMENT / PLAN:  PULMONARY A:  Acute respiratory failure in setting of seizure. P:   Goal SpO2>92, pH>7.30 Full vent support Trend CXR/ABG  CARDIOVASCULAR A: AF, controlled rate. P:  Goal SBP <180  Labatalol PRN  RENAL A:  Acute vs chronic renal failure. Hypokalemia. P:   Trend BMP K 40x1 NS@100   GASTROINTESTINAL A:  No acute issues. P:   Protonix for GI Px NPO for now TF if intubated > 24 hours  HEMATOLOGIC A:  Status post tPA. P:  Trend CBC  SCD for DVT Px  INFECTIOUS A:  No acute issues P:  No intervention required  ENDOCRINE A:  DM. Hyperglycemia. P:   SSI  NEUROLOGIC A:  Acute right hemispheric CVA, s/p TPA. Seizure.  Encephalopathy.  Possible ICH. P:   Per Neurology STAT Head CT Intermittent Fentanyl / Versecd Fentanyl PRN Proppofol gtt  I have personally obtained a history, examined the patient, evaluated laboratory and imaging results, formulated the assessment and plan and placed orders.  CRITICAL CARE: The patient is critically ill with multiple organ systems failure and requires high complexity decision making for assessment and support, frequent evaluation and titration of  therapies, application of advanced monitoring technologies and extensive interpretation of multiple databases. Critical Care Time devoted to patient care services described in this note is 40 minutes.   Erling Conte Pulmonary and Critical Care Medicine [facility] Pager: [phone]  10/26/2012, 2:40 PM

## 2012-10-26 NOTE — Progress Notes (Signed)
eLink Physician-Brief Progress Note Patient Name: [name] DOB: 04/26/1918 MRN: [id number]  Date of Service  10/26/2012   HPI/Events of Note  Resp alkalosis on vent.  Breathing over the vent at 500 cc TV   eICU Interventions  Plan: Decrease TV to 450 cc Recheck ABG at 2am   Intervention Category Major Interventions: Acid-Base disturbance - evaluation and management  Pelham Hennick 10/26/2012, 11:56 PM

## 2012-10-26 NOTE — H&P (Signed)
Admission H&P    Chief Complaint: Left sided weakness HPI: [name] is an 79 y.o. female who was at home with her daughter.  She awakened to use the bathroom and was able to ambulate and speak to her daughter.  When her daughter got up later in the morning the patient had slurred speech.  While they were talking the patient developed left sided weakness as well.  EMS was called and the patient was brought in as a code stroke.  Initial NIHSS of 21.  Date last known well: Date: 10/26/2012 Time last known well: Time: 05:30 tPA Given: Yes  Past Medical History  Diagnosis Date  . Diabetes mellitus   . Hypertension   . Hyperlipidemia   . Arthritis   . AF (atrial fibrillation)   . Cataract     decreased vision in left eye  . Peripheral vascular disease     Past Surgical History  Procedure Laterality Date  . Pacemaker insertion  2008  . Cholecystectomy    . Thyroid surgery      Family history: Mother died of a stroke.  Father died with cancer.  Had 9 siblings-4 of which died with cancer.  Social History:  reports that she has never smoked. She has never used smokeless tobacco. She reports that she does not drink alcohol or use illicit drugs.  Allergies:  Allergies  Allergen Reactions  . Nitrofurantoin Nausea And Vomiting  . Sulfa Antibiotics Hives   Medications: Current outpatient prescriptions: amiodarone (PACERONE) 400 MG tablet, Take 400 mg by mouth daily., Disp: , Rfl: ;   aspirin 81 MG tablet, Take 160 mg by mouth daily., Disp: , Rfl: ;   beta carotene w/minerals (OCUVITE) tablet, Take 1 tablet by mouth daily., Disp: , Rfl: ;   bumetanide (BUMEX) 1 MG tablet, Take 1 mg by mouth daily., Disp: , Rfl: ;   cyanocobalamin 500 MCG tablet, Take 500 mcg by mouth every morning., Disp: , Rfl:  furosemide (LASIX) 20 MG tablet, Take 1 tablet (20 mg total) by mouth daily., Disp: 30 tablet, Rfl: 0;   lisinopril (PRINIVIL,ZESTRIL) 10 MG tablet, Take 1 tablet (10 mg total) by mouth  daily., Disp: 30 tablet, Rfl: 0;   metFORMIN (GLUCOPHAGE-XR) 500 MG 24 hr tablet, Take 500 mg by mouth 3 (three) times daily. , Disp: , Rfl: ;   metoprolol tartrate (LOPRESSOR) 25 MG tablet, Take 25 mg by mouth 2 (two) times daily., Disp: , Rfl:  ondansetron (ZOFRAN) 4 MG tablet, Take 4 mg by mouth every 8 (eight) hours as needed for nausea., Disp: , Rfl: ;   simvastatin (ZOCOR) 40 MG tablet, Take 40 mg by mouth every evening., Disp: , Rfl:   ROS: History obtained from the patient  General ROS: negative for - chills, fatigue, fever, night sweats, weight gain or weight loss Psychological ROS: negative for - behavioral disorder, hallucinations, memory difficulties, mood swings or suicidal ideation Ophthalmic ROS: negative for - blurry vision, double vision, eye pain or loss of vision ENT ROS: negative for - epistaxis, nasal discharge, oral lesions, sore throat, tinnitus or vertigo Allergy and Immunology ROS: negative for - hives or itchy/watery eyes Hematological and Lymphatic ROS: negative for - bleeding problems, bruising or swollen lymph nodes Endocrine ROS: negative for - galactorrhea, hair pattern changes, polydipsia/polyuria or temperature intolerance Respiratory ROS: negative for - cough, hemoptysis, shortness of breath or wheezing Cardiovascular ROS: leg swelling Gastrointestinal ROS: negative for - abdominal pain, diarrhea, hematemesis, nausea/vomiting or stool incontinence Genito-Urinary ROS:  negative for - dysuria, hematuria, incontinence or urinary frequency/urgency Musculoskeletal ROS: negative for - joint swelling or muscular weakness Neurological ROS: as noted in HPI Dermatological ROS: negative for rash and skin lesion changes  Physical Examination: Blood pressure 156/58, pulse 76, resp. rate 18, height 5\' 2"  (1.575 m), weight 90.833 kg (200 lb 4 oz), SpO2 100.00%.  General Examination: HEENT-  Normocephalic, no lesions, without obvious abnormality.  Normal external eye and  conjunctiva.  Normal TM's bilaterally.  Normal auditory canals and external ears. Normal external nose, mucus membranes and septum.  Normal pharynx. Neck supple with no masses, nodes, nodules or enlargement. Cardiovascular - S1, S2 normal Lungs - chest clear, no wheezing, rales, normal symmetric air entry Abdomen - soft, non-tender; bowel sounds normal; no masses,  no organomegaly Extremities - pitting edema in the BLE  Neurologic Examination: Mental Status: Alert, oriented, thought content appropriate.  Speech fluent without evidence of aphasia but slurred.  Able to follow 3 step commands without difficulty.  Left neglect. Cranial Nerves: II: Discs flat bilaterally; LHH, pupils equal, round, reactive to light and accommodation III,IV, VI: ptosis not present, right gaze preference but patient able to turn eyes fully to the left V,VII: left facial droop, facial light touch sensation normal bilaterally VIII: hearing decreased bilaterally; hearing aid in the right ear IX,X: gag reflex present XI: shoulder shrug decreased on the left XII: midline tongue extension Motor: Right : Upper extremity   5/5    Left:     Upper extremity   2-3/5  Lower extremity   4-5/5    Lower extremity   2/5 Increased tone on the left Sensory: Pinprick and light touch intact throughout, bilaterally Deep Tendon Reflexes: 2+ in the upper extremities and absent in the lower extremities Plantars: Right: mute   Left: mute Cerebellar: normal finger-to-nose and normal heel-to-shin testing on the right Gait: Unable to test CV: pulses palpable throughout   Laboratory Studies:   Basic Metabolic Panel:  Recent Labs Lab 10/26/12 0658  NA 135  K 3.2*  CL 92*  GLUCOSE 165*  BUN 15  CREATININE 1.40*    Liver Function Tests: No results found for this basename: AST, ALT, ALKPHOS, BILITOT, PROT, ALBUMIN,  in the last 168 hours No results found for this basename: LIPASE, AMYLASE,  in the last 168 hours No results  found for this basename: AMMONIA,  in the last 168 hours  CBC:  Recent Labs Lab 10/26/12 0650 10/26/12 0658  WBC 7.5  --   NEUTROABS 4.0  --   HGB 13.3 14.6  HCT 39.3 43.0  MCV 90.6  --   PLT 327  --     Cardiac Enzymes: No results found for this basename: CKTOTAL, CKMB, CKMBINDEX, TROPONINI,  in the last 168 hours  BNP: No components found with this basename: POCBNP,   CBG:  Recent Labs Lab 10/26/12 0718  GLUCAP 150*    Microbiology: Results for orders placed during the hospital encounter of 10/12/12  CLOSTRIDIUM DIFFICILE BY PCR     Status: None   Collection Time    10/13/12 12:06 PM      Result Value Range Status   C difficile by pcr NEGATIVE  NEGATIVE Final    Coagulation Studies:  Recent Labs  10/26/12 0650  LABPROT 13.7  INR 1.07    Urinalysis: No results found for this basename: COLORURINE, APPERANCEUR, LABSPEC, PHURINE, GLUCOSEU, HGBUR, BILIRUBINUR, KETONESUR, PROTEINUR, UROBILINOGEN, NITRITE, LEUKOCYTESUR,  in the last 168 hours  Lipid Panel:  No results found for this basename: chol,  trig,  hdl,  cholhdl,  vldl,  ldlcalc    HgbA1C:  Lab Results  Component Value Date   HGBA1C  Value: 6.7 (NOTE)   The ADA recommends the following therapeutic goals for glycemic   control related to Hgb A1C measurement:   Goal of Therapy:   < 7.0% Hgb A1C   Action Suggested:  > 8.0% Hgb A1C   Ref:  Diabetes Care, 22, Suppl. 1, 1999* 08/07/2006    Urine Drug Screen:   No results found for this basename: labopia,  cocainscrnur,  labbenz,  amphetmu,  thcu,  labbarb    Alcohol Level: No results found for this basename: ETH,  in the last 168 hours  Other results: EKG: sinus rhythm  Imaging: Ct Head Wo Contrast  10/26/2012   *RADIOLOGY REPORT*  Clinical Data: Slurred speech and left-sided weakness.  Code stroke.  CT HEAD WITHOUT CONTRAST  Technique:  Contiguous axial images were obtained from the base of the skull through the vertex without contrast.  Comparison:  07/01/2010.  Findings:  Skull:No acute osseous abnormality. No lytic or blastic lesion.  Orbits: Right cataract resection.  Brain: No evidence of acute abnormality, such as acute infarction, hemorrhage, hydrocephalus, or mass lesion/mass effect (questionable loss of posterior right insula gray-white differentiation, not definite given the scan quality).  Similar pattern of patchy bilateral cerebral white matter low attenuation.  Low attenuation in the of cortical left occipital pole was likely present previously, and does not explain provided history.  These results were called by telephone on 10/26/2012 at 07:30 a.m. to Dr. Thad Ranger, who verbally acknowledged these results.  IMPRESSION: 1. No acute intracranial hemorrhage. 2. Questionable early ischemic changes in the posterior right insula.  3.  Chronic small vessel ischemia.   Original Report Authenticated By: Tiburcio Pea    Assessment: 79 y.o. female with the onset of left sided weakness and slurred speech this morning.  Patient with multiple stroke risk factors.  Has been on Coumadin in the past for atrial fibrillation but was discontinued due to risk of falls.  Patient now on aspirin at home.  CT reviewed and shows no acute changes.  Risks and benefits of tPA discussed with daughter.  Verbal consent obtained for tPA administration.  Contraindications reviewed and tPA administered.    Stroke Risk Factors - atrial fibrillation, diabetes mellitus, hyperlipidemia and hypertension  Plan: 1. HgbA1c, fasting lipid panel 2. Repeat head CT in 24 hours 3. PT consult, OT consult, Speech consult 4. Echocardiogram 5. Carotid dopplers 6. Prophylactic therapy-None 7. Risk factor modification 8. Telemetry monitoring 9. Frequent neuro checks 10. Admission to 3100  Case discussed with Dr. Fayrene Fearing  This patient is critically ill and at significant risk of neurological worsening, death and care requires constant monitoring of vital signs,  hemodynamics,respiratory and cardiac monitoring, neurological assessment, discussion with family, other specialists and medical decision making of high complexity. I spent 80 minutes of neurocritical care time  in the care of  this patient.  Thana Farr, MD [facility] [phone] 10/26/2012  7:48 AM   Thana Farr, MD [facility] [phone] 10/26/2012, 7:44 AM

## 2012-10-26 NOTE — ED Provider Notes (Signed)
CSN: [id number]     Arrival date & time 10/26/12  1191 History   First MD Initiated Contact with Patient 10/26/12 [phone]     Chief Complaint  Patient presents with  . Code Stroke    HPI   Patient is here with her daughter occurred at 5:30 this morning she had been up and around had been conversant and interactive with her daughter. They were shopping yesterday. This morning the patient was able to speak and understand clearly related she was up and ambulatory. At 5:30 her daughter noted that her mouth was drooping she was slumping to her left side her speech was abnormal.  No complaints of pain. No falls or injuries or traumas. Recently admitted for congestive heart failure. On aspirin but no additional anticoagulants. Past Medical History  Diagnosis Date  . Diabetes mellitus   . Hypertension   . Hyperlipidemia   . Arthritis   . AF (atrial fibrillation)   . Cataract     decreased vision in left eye  . Peripheral vascular disease    Past Surgical History  Procedure Laterality Date  . Pacemaker insertion  2008  . Cholecystectomy    . Thyroid surgery     No family history on file. History  Substance Use Topics  . Smoking status: Never Smoker   . Smokeless tobacco: Never Used  . Alcohol Use: No   OB History   Grav Para Term Preterm Abortions TAB SAB Ect Mult Living                 Review of Systems  Constitutional: Negative for fever, chills, diaphoresis, appetite change and fatigue.  HENT: Negative for sore throat, mouth sores and trouble swallowing.   Eyes: Positive for visual disturbance.  Respiratory: Negative for cough, chest tightness, shortness of breath and wheezing.   Cardiovascular: Negative for chest pain.  Gastrointestinal: Negative for nausea, vomiting, abdominal pain, diarrhea and abdominal distention.  Endocrine: Negative for polydipsia, polyphagia and polyuria.  Genitourinary: Negative for dysuria, frequency and hematuria.  Musculoskeletal: Negative for gait  problem.  Skin: Negative for color change, pallor and rash.  Neurological: Positive for weakness and numbness. Negative for dizziness, syncope, light-headedness and headaches.  Hematological: Does not bruise/bleed easily.  Psychiatric/Behavioral: Negative for behavioral problems and confusion.    Allergies  Nitrofurantoin and Sulfa antibiotics  Home Medications   Current Outpatient Rx  Name  Route  Sig  Dispense  Refill  . amiodarone (PACERONE) 400 MG tablet   Oral   Take 400 mg by mouth daily.         Marland Kitchen aspirin 81 MG tablet   Oral   Take 160 mg by mouth daily.         . beta carotene w/minerals (OCUVITE) tablet   Oral   Take 1 tablet by mouth daily.         . bumetanide (BUMEX) 1 MG tablet   Oral   Take 1 mg by mouth daily.         . cyanocobalamin 500 MCG tablet   Oral   Take 500 mcg by mouth every morning.         . furosemide (LASIX) 20 MG tablet   Oral   Take 1 tablet (20 mg total) by mouth daily.   30 tablet   0   . lisinopril (PRINIVIL,ZESTRIL) 10 MG tablet   Oral   Take 1 tablet (10 mg total) by mouth daily.   30 tablet   0   .  metFORMIN (GLUCOPHAGE-XR) 500 MG 24 hr tablet   Oral   Take 500 mg by mouth 3 (three) times daily.          . metoprolol tartrate (LOPRESSOR) 25 MG tablet   Oral   Take 25 mg by mouth 2 (two) times daily.         . ondansetron (ZOFRAN) 4 MG tablet   Oral   Take 4 mg by mouth every 8 (eight) hours as needed for nausea.         . simvastatin (ZOCOR) 40 MG tablet   Oral   Take 40 mg by mouth every evening.          Ht 5\' 2"  (1.575 m)  Wt 200 lb 4 oz (90.833 kg)  BMI 36.62 kg/m2  SpO2 99% Physical Exam  Constitutional: She is oriented to person, place, and time. She appears well-developed and well-nourished. No distress.  HENT:  Head: Normocephalic.  Left lower facial droop  Eyes: Conjunctivae are normal. Pupils are equal, round, and reactive to light. No scleral icterus.  Neck: Normal range of  motion. Neck supple. No thyromegaly present.  No bruits  Cardiovascular: Normal rate and regular rhythm.  Exam reveals no gallop and no friction rub.   No murmur heard. Regular rhythm  Pulmonary/Chest: Effort normal and breath sounds normal. No respiratory distress. She has no wheezes. She has no rales.  Clear lungs  Abdominal: Soft. Bowel sounds are normal. She exhibits no distension. There is no tenderness. There is no rebound.  Musculoskeletal: Normal range of motion.  Neurological: She is alert and oriented to person, place, and time.  Left homonymous hemianopsia. Left upper no weakness 1/5 left lower extremity weakness one out of 5 left lower facial weakness.  No aphasia  Skin: Skin is warm and dry. No rash noted.  Psychiatric: She has a normal mood and affect. Her behavior is normal.    ED Course  Procedures (including critical care time) Labs Review Labs Reviewed  GLUCOSE, CAPILLARY - Abnormal; Notable for the following:    Glucose-Capillary 150 (*)    All other components within normal limits  POCT I-STAT, CHEM 8 - Abnormal; Notable for the following:    Potassium 3.2 (*)    Chloride 92 (*)    Creatinine, Ser 1.40 (*)    Glucose, Bld 165 (*)    Calcium, Ion 0.85 (*)    All other components within normal limits  ETHANOL  PROTIME-INR  APTT  CBC  DIFFERENTIAL  COMPREHENSIVE METABOLIC PANEL  TROPONIN I  URINE RAPID DRUG SCREEN (HOSP PERFORMED)  URINALYSIS, ROUTINE W REFLEX MICROSCOPIC  POCT I-STAT TROPONIN I    EKG sinus rhythm PVCs inferior changes consistent with prior inferior MI.  Slow R-wave progression of note her EKG dated 10/15/2012 she was in atrial fibrillation. Imaging Review No results found.  MDM   1. CVA (cerebral infarction)    He course CT shows senescent changes. No contraindication to lytic treatment. Patient was reevaluated by myself in the room. I initially evaluated her as she entered our emergency room from the adult day. And that she would  immediately CT. She was evaluated in the softener coronal sarcoma room after completion of seat. We're agreement that she is a candidate for TPA initiated. Admitted to the neuro ICU by Dr. Thad Ranger. I discussed her diagnosis and care plan with her daughter Clydie Braun and her son. We've discussed risk of bleeding from TPA. We risk discussed risk of untreated strokes progressing to  worsen neurological deficits. Discussed risk of bleeding as high as 10% or higher, with some episodes of bleeding being fatal. They're in agreement to proceed with TPA treatment.  EKG shows recent as 10 days ago do show atrial fibrillation. Family says that this was discussed. The risk of stroke were discussed. The risk of fall was thought to be greater per family and this was their decision.    Claudean Kinds, MD 10/26/12 [phone]

## 2012-10-26 NOTE — Progress Notes (Signed)
Chaplain Note: responded to Code Blue. Provided emotional and spiritual support for pt's daughter who was crying and trying to come to terms with idea this might be it for her mother. Pt's daughter stated pt is a believer as was her husband and all their close friends.  Says she herself is also a believer but she is dealing with the shock of how suddenly this happened.  Provided comfort care for pt's daughter using scripture and ministry of presence.   Rutherford Nail Chaplain

## 2012-10-26 NOTE — Code Documentation (Signed)
  Patient Name: [name]   MRN: [id number]   Date of Birth/ Sex: [birth year] , female      Admission Date: 10/26/2012  Attending Provider: Thana Farr, MD  Primary Diagnosis: <principal problem not specified>   Indication: Pt was in her usual state of health until this PM, when she was noted to be unresponsive and with a gaze preferance. Code blue was subsequently called. At the time of arrival on scene, ACLS protocol was underway. The patient had a pulse and was tachycardic, but not protecting her airway. Dr. De Burrs was running the code and intubated the patient. The patient had a mag of 1.7 this am, and 2 g IV mag was administered.    Technical Description:  - CPR performance duration:  0  minutes  - Was defibrillation or cardioversion used? No   - Was external pacer placed? No  - Was patient intubated pre/post CPR? Yes   Medications Administered: Y = Yes; Blank = No Amiodarone    Atropine    Calcium    Epinephrine    Lidocaine    Magnesium  Y  Norepinephrine    Phenylephrine    Sodium bicarbonate    Vasopressin     Post CPR evaluation:  - Final Status - Was patient successfully resuscitated ? Yes - What is current rhythm? Tachycardia - What is current hemodynamic status? Hypertensive  Miscellaneous Information:  - Labs sent, including: None  - Primary team notified?  Yes  - Family Notified? Yes  - Additional notes/ transfer status:  Patient was already in ICU and remained there.     Pleas Koch, MD PGY 1 Dede Query, MD PGY 3 10/26/2012, 2:08 PM

## 2012-10-26 NOTE — Progress Notes (Signed)
Pt alert, conversing w/ family, passed stroke swallow screen.

## 2012-10-27 ENCOUNTER — Inpatient Hospital Stay (HOSPITAL_COMMUNITY): Payer: Medicare Other

## 2012-10-27 ENCOUNTER — Encounter (HOSPITAL_COMMUNITY): Payer: Self-pay | Admitting: Radiology

## 2012-10-27 DIAGNOSIS — I6350 Cerebral infarction due to unspecified occlusion or stenosis of unspecified cerebral artery: Secondary | ICD-10-CM

## 2012-10-27 LAB — GLUCOSE, CAPILLARY
Glucose-Capillary: 131 mg/dL — ABNORMAL HIGH (ref 70–99)
Glucose-Capillary: 114 mg/dL — ABNORMAL HIGH (ref 70–99)

## 2012-10-27 LAB — BASIC METABOLIC PANEL
Chloride: 94 mEq/L — ABNORMAL LOW (ref 96–112)
Creatinine, Ser: 0.79 mg/dL (ref 0.50–1.10)
GFR calc Af Amer: 82 mL/min — ABNORMAL LOW (ref >90)
Potassium: 3.9 mEq/L (ref 3.5–5.1)

## 2012-10-27 LAB — COMPREHENSIVE METABOLIC PANEL
Alkaline Phosphatase: 46 U/L (ref 39–117)
BUN: 13 mg/dL (ref 6–23)
Chloride: 96 mEq/L (ref 96–112)
Creatinine, Ser: 0.92 mg/dL (ref 0.50–1.10)
GFR calc Af Amer: 61 mL/min — ABNORMAL LOW (ref >90)
Glucose, Bld: 118 mg/dL — ABNORMAL HIGH (ref 70–99)
Potassium: 3.4 mEq/L — ABNORMAL LOW (ref 3.5–5.1)
Total Bilirubin: 0.8 mg/dL (ref 0.3–1.2)

## 2012-10-27 LAB — LIPID PANEL
Cholesterol: 124 mg/dL (ref 0–200)
HDL: 75 mg/dL (ref >39)
Total CHOL/HDL Ratio: 1.7 RATIO
Triglycerides: 87 mg/dL (ref <150)

## 2012-10-27 LAB — BLOOD GAS, ARTERIAL
Acid-Base Excess: 1.8 mmol/L (ref 0.0–2.0)
Bicarbonate: 24.7 mEq/L — ABNORMAL HIGH (ref 20.0–24.0)
FIO2: 0.3 %
TCO2: 25.6 mmol/L (ref 0–100)
pCO2 arterial: 31.3 mmHg — ABNORMAL LOW (ref 35.0–45.0)
pO2, Arterial: 91.4 mmHg (ref 80.0–100.0)

## 2012-10-27 LAB — CBC
HCT: 38.3 % (ref 36.0–46.0)
Hemoglobin: 12.9 g/dL (ref 12.0–15.0)
MCHC: 33.7 g/dL (ref 30.0–36.0)
MCV: 91 fL (ref 78.0–100.0)
WBC: 14.9 10*3/uL — ABNORMAL HIGH (ref 4.0–10.5)

## 2012-10-27 LAB — HEMOGLOBIN A1C: Mean Plasma Glucose: 151 mg/dL — ABNORMAL HIGH (ref <117)

## 2012-10-27 MED ORDER — CLOPIDOGREL BISULFATE 75 MG PO TABS
75.0000 mg | ORAL_TABLET | Freq: Every day | ORAL | Status: DC
  Administered 2012-10-27 – 2012-10-28 (×2): 75 mg via ORAL
  Filled 2012-10-27 (×4): qty 1

## 2012-10-27 MED ORDER — FUROSEMIDE 10 MG/ML IJ SOLN
INTRAMUSCULAR | Status: AC
  Administered 2012-10-27: 40 mg via INTRAVENOUS
  Filled 2012-10-27: qty 4

## 2012-10-27 MED ORDER — SODIUM CHLORIDE 0.9 % IV SOLN
500.0000 mg | Freq: Two times a day (BID) | INTRAVENOUS | Status: DC
  Administered 2012-10-27 – 2012-10-29 (×6): 500 mg via INTRAVENOUS
  Filled 2012-10-27 (×8): qty 5

## 2012-10-27 MED ORDER — POTASSIUM CHLORIDE 10 MEQ/100ML IV SOLN
10.0000 meq | INTRAVENOUS | Status: AC
  Administered 2012-10-27 (×4): 10 meq via INTRAVENOUS
  Filled 2012-10-27: qty 100
  Filled 2012-10-27: qty 300

## 2012-10-27 MED ORDER — FUROSEMIDE 10 MG/ML IJ SOLN
40.0000 mg | Freq: Once | INTRAMUSCULAR | Status: AC
  Administered 2012-10-27: 40 mg via INTRAVENOUS

## 2012-10-27 NOTE — Progress Notes (Signed)
Stroke Team Progress Note  HISTORY Per admission note: is an 79 y.o. female who was at home with her daughter. She awakened to use the bathroom and was able to ambulate and speak to her daughter. When her daughter got up later in the morning the patient had slurred speech. While they were talking the patient developed left sided weakness as well. EMS was called and the patient was brought in as a code stroke. Initial NIHSS of 21. TPA given.   She was admitted to the neuro ICU for further evaluation and treatment.  SUBJECTIVE Had complicated past 24hrs. Initially found to be stable upon admission to Neuro-ICU. Around 230pm noted to have acute drop in mental status and respiratory decline. Noted L gaze deviaiton and GTC movements, lasted 30s to . Code blue called, required intubation. Doing well overnight. Per discussion with CCM will attempt wean off vent today.   OBJECTIVE Most recent Vital Signs: Filed Vitals:   10/27/12 0600 10/27/12 0700 10/27/12 0751 10/27/12 0815  BP: 122/68 132/45    Pulse: 72 68  72  Temp:   99.7 F (37.6 C)   TempSrc:   Oral   Resp: 14 17  16   Height:      Weight:      SpO2: 100% 100%  100%   CBG (last 3)   Recent Labs  10/26/12 2346 10/27/12 0353 10/27/12 0749  GLUCAP 97 96 131*    IV Fluid Intake:   . sodium chloride 100 mL/hr (10/26/12 2100)  . propofol 15 mcg/kg/min (10/27/12 0818)    MEDICATIONS  . antiseptic oral rinse  15 mL Mouth Rinse QID  . chlorhexidine  15 mL Mouth Rinse BID  . insulin aspart  0-15 Units Subcutaneous Q4H  . pantoprazole (PROTONIX) IV  40 mg Intravenous QHS   PRN:  acetaminophen, acetaminophen, fentaNYL, labetalol, senna-docusate  Diet:  NPO as intubated Activity:  Bedrest DVT Prophylaxis:  ICDs  CLINICALLY SIGNIFICANT STUDIES Basic Metabolic Panel:  Recent Labs Lab 10/26/12 0650 10/26/12 0658 10/27/12 0550  NA 134* 135 137  K 3.3* 3.2* 3.4*  CL 92* 92* 96  CO2 27  --  25  GLUCOSE 166* 165* 118*  BUN  15 15 13   CREATININE 1.17* 1.40* 0.92  CALCIUM 8.1*  --  7.5*   Liver Function Tests:  Recent Labs Lab 10/26/12 0650 10/27/12 0550  AST 21 18  ALT 11 10  ALKPHOS 50 46  BILITOT 0.7 0.8  PROT 6.5 5.6*  ALBUMIN 3.5 2.9*   CBC:  Recent Labs Lab 10/26/12 0650 10/26/12 0658 10/27/12 0550  WBC 7.5  --  14.9*  NEUTROABS 4.0  --   --   HGB 13.3 14.6 12.9  HCT 39.3 43.0 38.3  MCV 90.6  --  91.0  PLT 327  --  254   Coagulation:  Recent Labs Lab 10/26/12 0650  LABPROT 13.7  INR 1.07   Cardiac Enzymes:  Recent Labs Lab 10/26/12 0650  TROPONINI <0.30   Urinalysis:  Recent Labs Lab 10/26/12 0731  COLORURINE YELLOW  LABSPEC 1.006  PHURINE 5.0  GLUCOSEU NEGATIVE  HGBUR NEGATIVE  BILIRUBINUR NEGATIVE  KETONESUR NEGATIVE  PROTEINUR NEGATIVE  UROBILINOGEN 0.2  NITRITE NEGATIVE  LEUKOCYTESUR NEGATIVE   Lipid Panel    Component Value Date/Time   CHOL 124 10/27/2012 0550   TRIG 87 10/27/2012 0550   HDL 75 10/27/2012 0550   CHOLHDL 1.7 10/27/2012 0550   VLDL 17 10/27/2012 0550   LDLCALC 32 10/27/2012 0550  HgbA1C  Lab Results  Component Value Date   HGBA1C  Value: 6.7 (NOTE)   The ADA recommends the following therapeutic goals for glycemic   control related to Hgb A1C measurement:   Goal of Therapy:   < 7.0% Hgb A1C   Action Suggested:  > 8.0% Hgb A1C   Ref:  Diabetes Care, 22, Suppl. 1, 1999* 08/07/2006    Urine Drug Screen:     Component Value Date/Time   LABOPIA NONE DETECTED 10/26/2012 0731   COCAINSCRNUR NONE DETECTED 10/26/2012 0731   LABBENZ NONE DETECTED 10/26/2012 0731   AMPHETMU NONE DETECTED 10/26/2012 0731   THCU NONE DETECTED 10/26/2012 0731   LABBARB NONE DETECTED 10/26/2012 0731    Alcohol Level:  Recent Labs Lab 10/26/12 0650  ETH <11    Ct Head Wo Contrast  10/27/2012   *RADIOLOGY REPORT*  Clinical Data: Status post t-PA  CT HEAD WITHOUT CONTRAST  Technique:  Contiguous axial images were obtained from the base of the skull through the vertex  without contrast.  Comparison: 10/26/2012  Findings: The bony calvarium is intact.  There are again noted changes consistent with infarcts in the right posterior parietal region.  This area measures approximately 4.6 x 3.8 cm in greatest dimension.  No significant mass effect upon the adjacent ventricle is noted.  Mild chronic white matter ischemic change is again seen. The hyperdensity seen in the branches of the right middle cerebral artery is less well visualized on the current study.  No acute hemorrhages seen.  IMPRESSION: Evolving right middle cerebral artery infarct.  No acute hemorrhage is noted.  Continued follow-up is recommended as indicated.   Original Report Authenticated By: Alcide Clever, M.D.   Ct Head Wo Contrast  10/26/2012   *RADIOLOGY REPORT*  Clinical Data: Acute right-sided CVA with new onset respiratory failure.  CVA treated with t-PA.Seizure.  CT HEAD WITHOUT CONTRAST  Technique:  Contiguous axial images were obtained from the base of the skull through the vertex without contrast.  Comparison: 10/26/2012.  Findings: Subacute infarction is present in the posterior right MCA territory with decreased attenuation of the gray and white matter. Posterior fossa structures appear within normal limits.  There is no hemorrhage.  Benign basal ganglia calcifications are present. On image number 11, they are this ICU patient in the posterior MCA branch, probably representing thrombosis.  There is no midline shift or hydrocephalus.  The calvarium appears intact.  IMPRESSION: Developing posterior right MCA division infarct.  No hemorrhage or complicating features in this patient with seizure.   Original Report Authenticated By: Andreas Newport, M.D.   Ct Head Wo Contrast  10/26/2012   *RADIOLOGY REPORT*  Clinical Data: Slurred speech and left-sided weakness.  Code stroke.  CT HEAD WITHOUT CONTRAST  Technique:  Contiguous axial images were obtained from the base of the skull through the vertex without  contrast.  Comparison: 07/01/2010.  Findings:  Skull:No acute osseous abnormality. No lytic or blastic lesion.  Orbits: Right cataract resection.  Brain: No evidence of acute abnormality, such as acute infarction, hemorrhage, hydrocephalus, or mass lesion/mass effect (questionable loss of posterior right insula gray-white differentiation, not definite given the scan quality).  Similar pattern of patchy bilateral cerebral white matter low attenuation.  Low attenuation in the of cortical left occipital pole was likely present previously, and does not explain provided history.  These results were called by telephone on 10/26/2012 at 07:30 a.m. to Dr. Thad Ranger, who verbally acknowledged these results.  IMPRESSION: 1. No acute intracranial  hemorrhage. 2. Questionable early ischemic changes in the posterior right insula.  3.  Chronic small vessel ischemia.   Original Report Authenticated By: Tiburcio Pea   Dg Chest Port 1 View  10/27/2012   *RADIOLOGY REPORT*  Clinical Data: Check endotracheal tube  PORTABLE CHEST - 1 VIEW  Comparison: 10/26/2012  Findings: The endotracheal tube and nasogastric catheter are again identified and stable.  A pacing device is again seen.  Increased density is noted over the bases bilaterally worse than that seen on the prior exam.  This is consistent with bilateral pleural effusions.  Likely underlying atelectasis is present.  No definitive pneumothorax is seen.  IMPRESSION: Increasing bilateral pleural effusions.   Original Report Authenticated By: Alcide Clever, M.D.   Dg Chest Port 1 View  10/26/2012   *RADIOLOGY REPORT*  Clinical Data: Endotracheal tube placement.  Evaluate ET tube.  PORTABLE CHEST - 1 VIEW  Comparison: 10/12/2012.  Findings: The endotracheal tube tip is 31 mm from the carina. Interval intubation.  Enteric tube is present with the tip not visualized.  Defibrillator pads overlie the chest.  Left subclavian dual lead pacemaker.  Right costophrenic angle is excluded  from view.  There is lucency in the left upper quadrant suspicious for intra-abdominal free air. Development of bilateral effusions, basilar atelectasis and basilar airspace disease.  IMPRESSION: 1.  Interval intubation with endotracheal tube tip 31 mm from the carina. 2.  Enteric tube present with the tip not visualized. 3.  Possible free intra-abdominal air in the left upper quadrant. Critical Value/emergent results were called by telephone at the time of interpretation on 10/26/2012 at 1519 hours to [facility], RN, who verbally acknowledged these results. 4.  Development of pleural effusions and basilar airspace disease likely representing CHF.   Original Report Authenticated By: Andreas Newport, M.D.    Physical Exam   Mental Status:  Sedated and intubated. Makes good eye contact, follows commands (blink, close/open eyes, move extremities) Cranial Nerves:  PERRL, ? Decreased blink to threat on L side, EOMI, no ptosis, left facial droop, decreased hearing bilateral(chronic), gag present.  Motor:  Mittens and restraints on, appears to move both upper extremities R>>L and both lower extremities R>>L Increased tone on the left  Sensory: Pinprick and light touch intact throughout, bilaterally  Deep Tendon Reflexes: 2+ in the upper extremities and absent in the lower extremities  Plantars:  Right: mute Left: mute  Cerebellar:  Unable to assess   ASSESSMENT [name] is a 79 y.o. female presenting with slurred speech and left sided weakness. Status post IV t-PA on 8/30 at . Head CT shows evolving R MCA ischemic infarct. Based on location/size suspect may be embolic etiology. Anti-platelets held initially 2/2 tPA administration. Was taking ASA 162mg  daily prior to admission. Repeat CT head stable. Hospital stay complicated by GTC seizure requiring intubation  R MCA ischemic infarct: suspect embolic etiology  GTC seizure: suspect provoked 2/2 infarct. Low suspicion due to  infectious or other etiology  Hospital day # 1  TREATMENT/PLAN  Will start Plavix 75mg  daily. Repeat head CT stable and patient >24hrs since tPA administration. Had stroke on ASA 162mg  daily  Hold statin as LDL 32  HbA1c pending  BP stable  No MRI 2/2 PPM  Will get 2D echo   Carotid doppler unable to fully visualize vessels  PT/OT/Speech   Extubation per CCM  Will start Keppra 500mg  BID, likely will not need long term   Mental status appears stable, no indication  for EEG or LP for seizure workup at this time  Elspeth Cho, DO Neurology-Stroke  This patient is critically ill and at significant risk of neurological worsening, death and care requires constant monitoring of vital signs, hemodynamics,respiratory and cardiac monitoring, neurological assessment, discussion with family, other specialists and medical decision making of high complexity. I spent 45 minutes of neurocritical care time in the care of this patient.

## 2012-10-27 NOTE — Progress Notes (Signed)
PULMONARY  / CRITICAL CARE MEDICINE  Name: [name] MRN: [id number] DOB: 05/26/1918    ADMISSION DATE:  10/26/2012 CONSULTATION DATE:  10/26/2012  REFERRING MD :  Thad Ranger   PRIMARY SERVICE: Stroke  CHIEF COMPLAINT:  Respiratory failure   BRIEF PATIENT DESCRIPTION:  79 yo admitted to Neuro ICU on 8/30 with acute right hemisphic CVA, s/p TPA with improved left sided weakness. Developed witnessed seizure, became hypoxic and required intubation.  SIGNIFICANT EVENTS / STUDIES:  8/30  Head CT >>> nad 8/30  Seizure/ respiratory failure, intubated 8/30  Head Ct >>>Developing posterior right MCA division infarct. No hemorrhage 8/30  TTE >>> 8/30  Carotid Doppler >>>Right: mild calcific plaque origin ICA. Left: moderate softplaque origin ICA. Unable to adequately visualize ICAs bilaterally. Right vertebral artery flow is antegrade. 8/31 Head CT: Developing posterior right MCA division infarct. No hemorrhage   LINES / TUBES: OETT 8/30 >>>8/31 OGT 8/30 >>>8/31 Foley 8/30 >>>  CULTURES:  ANTIBIOTICS:  SUBJECTIVE: No overnight issues.  VITAL SIGNS: Temp:  [97.5 F (36.4 C)-99.7 F (37.6 C)] 99.7 F (37.6 C) (08/31 0751) Pulse Rate:  [64-103] 72 (08/31 0815) Resp:  [9-32] 16 (08/31 0815) BP: (108-169)/(33-79) 132/45 mmHg (08/31 0700) SpO2:  [98 %-100 %] 100 % (08/31 0815) FiO2 (%):  [30 %-100 %] 30 % (08/31 0815)  HEMODYNAMICS:   VENTILATOR SETTINGS: Vent Mode:  [-] CPAP;PSV FiO2 (%):  [30 %-100 %] 30 % Set Rate:  [14 bmp] 14 bmp Vt Set:  [450 mL-500 mL] 450 mL PEEP:  [5 cmH20] 5 cmH20 Pressure Support:  [10 cmH20] 10 cmH20 Plateau Pressure:  [19 cmH20-22 cmH20] 21 cmH20  INTAKE / OUTPUT: Intake/Output     08/30 0701 - 08/31 0700 08/31 0701 - 09/01 0700   I.V. (mL/kg) 1956.9 (22)    IV Piggyback 50    Total Intake(mL/kg) 2006.9 (22.6)    Urine (mL/kg/hr) 1550 (0.7)    Total Output 1550     Net +456.9           PHYSICAL EXAMINATION: General:  Sedated elderly  female anxious as wants to come off vent Neuro:  HOH, moves ext, left weaker HEENT:  Orally intubated  Cardiovascular:  Regular irregular, no MRG Lungs:  Scattered rhonchi, Vt 350-400 on PSV 5 Abdomen:+ bowel sounds no OM  Musculoskeletal:  Intact  Skin:  LE edema   LABS:  Recent Labs Lab 10/26/12 0650 10/26/12 0658 10/26/12 1721 10/27/12 0427 10/27/12 0550  HGB 13.3 14.6  --   --  12.9  WBC 7.5  --   --   --  14.9*  PLT 327  --   --   --  254  NA 134* 135  --   --  137  K 3.3* 3.2*  --   --  3.4*  CL 92* 92*  --   --  96  CO2 27  --   --   --  25  GLUCOSE 166* 165*  --   --  118*  BUN 15 15  --   --  13  CREATININE 1.17* 1.40*  --   --  0.92  CALCIUM 8.1*  --   --   --  7.5*  AST 21  --   --   --  18  ALT 11  --   --   --  10  ALKPHOS 50  --   --   --  46  BILITOT 0.7  --   --   --  0.8  PROT 6.5  --   --   --  5.6*  ALBUMIN 3.5  --   --   --  2.9*  INR 1.07  --   --   --   --   APTT 29  --   --   --   --   TROPONINI <0.30  --   --   --   --   PHART  --   --  7.541* 7.509*  --   PCO2ART  --   --  36.8 31.3*  --   PO2ART  --   --  377.0* 91.4  --   HCO3  --   --  31.7* 24.7*  --   O2SAT  --   --  100.0 98.4  --     Recent Labs Lab 10/26/12 1728 10/26/12 1929 10/26/12 2346 10/27/12 0353 10/27/12 0749  GLUCAP 139* 138* 97 96 131*   CXR:  ASSESSMENT / PLAN:  PULMONARY A:   Acute respiratory failure in setting of seizure (no subsequent seizures after initial intubation).  Bilateral R>L effusions. Favorable weaning parameters.  P:   Extubate Trend CXR  CARDIOVASCULAR A: AF, controlled rate. P:  Goal SBP <180  Labatalol PRN  RENAL A:  Acute vs chronic renal failure - resolved.  Hypokalemia. P:   KVO IVF K 10 x 4 Trend BMP Lasix 40 x 1 prior to extubation  GASTROINTESTINAL A:  No acute issues. P:   Protonix for GI Px NPO for now  HEMATOLOGIC A:  Status post tPA.  Leukocytosis, likely reactive. P:  Trend CBC  SCD for DVT  Px  INFECTIOUS A:  No acute issues. P:  No intervention required.  ENDOCRINE A:  DM. Hyperglycemia. P:   SSI  NEUROLOGIC A:   Acute right MCA CVA, s/p TPA. Seizure, witnessed 8/30. Encephalopathy.   P:   Per Neurology Keppra Plavix  BABCOCK,PETE,  10/27/2012, 8:51 AM  I have personally obtained a history, examined the patient, evaluated laboratory and imaging results, formulated the assessment and plan and placed orders.  CRITICAL CARE:  The patient is critically ill with multiple organ systems failure and requires high complexity decision making for assessment and support, frequent evaluation and titration of therapies, application of advanced monitoring technologies and extensive interpretation of multiple databases. Critical Care Time devoted to patient care services described in this note is 35 minutes.   Lonia Farber, MD Pulmonary and Critical Care Medicine [facility] Pager: [phone]  10/27/2012, 10:19 AM

## 2012-10-27 NOTE — Progress Notes (Signed)
SLP Cancellation Note  Patient Details Name: [name] MRN: [id number] DOB: Sep 06, 1918   Cancelled treatment:  Received order for SLE.  Evaluation deferred to 10/28/12. Moreen Fowler MS, CCC-SLP [phone] [facility] 10/27/2012, 3:58 PM

## 2012-10-27 NOTE — Procedures (Signed)
Extubation Procedure Note  Patient Details:   Name: Emmelyn Schmale DOB: 1918-09-01 MRN: [id number]   Airway Documentation:  Airway 7.5 mm (Active)  Secured at (cm) 23 cm 10/27/2012  8:15 AM  Measured From Lips 10/27/2012  8:15 AM  Secured Location Left 10/27/2012  8:15 AM  Secured By [employer] 10/27/2012  8:15 AM  Tube Holder Repositioned Yes 10/27/2012  8:15 AM  Cuff Pressure (cm H2O) 24 cm H2O 10/26/2012  7:39 PM  Site Condition Dry 10/27/2012  8:15 AM    Evaluation  O2 sats: stable throughout Complications: No apparent complications Patient did tolerate procedure well. Bilateral Breath Sounds: Diminished;Rhonchi Suctioning: Oral Yes  Pt extubated per MD order. Pt placed on 3l [place].  Pt tolerating well, O2 sats 99%.   Closson, Terie Purser 10/27/2012, 9:42 AM

## 2012-10-27 NOTE — Progress Notes (Signed)
PT Cancellation Note  Patient Details Name: [name] MRN: [id number] DOB: 1918/07/26   Cancelled Treatment:    Reason Eval/Treat Not Completed: Medical issues which prohibited therapy.  Patient just extubated.  Will hold PT today.  Will return in am for PT evaluation.   Vena Austria 10/27/2012, 10:36 AM Durenda Hurt. Renaldo Fiddler, [facility] Pager [phone]

## 2012-10-28 ENCOUNTER — Inpatient Hospital Stay (HOSPITAL_COMMUNITY): Payer: Medicare Other

## 2012-10-28 DIAGNOSIS — I6350 Cerebral infarction due to unspecified occlusion or stenosis of unspecified cerebral artery: Secondary | ICD-10-CM

## 2012-10-28 DIAGNOSIS — I509 Heart failure, unspecified: Secondary | ICD-10-CM

## 2012-10-28 DIAGNOSIS — I5041 Acute combined systolic (congestive) and diastolic (congestive) heart failure: Secondary | ICD-10-CM

## 2012-10-28 DIAGNOSIS — I70269 Atherosclerosis of native arteries of extremities with gangrene, unspecified extremity: Secondary | ICD-10-CM

## 2012-10-28 LAB — COMPREHENSIVE METABOLIC PANEL
ALT: 9 U/L (ref 0–35)
AST: 17 U/L (ref 0–37)
Albumin: 2.8 g/dL — ABNORMAL LOW (ref 3.5–5.2)
CO2: 26 mEq/L (ref 19–32)
Calcium: 7.2 mg/dL — ABNORMAL LOW (ref 8.4–10.5)
GFR calc non Af Amer: 73 mL/min — ABNORMAL LOW (ref >90)
Sodium: 136 mEq/L (ref 135–145)
Total Protein: 5.7 g/dL — ABNORMAL LOW (ref 6.0–8.3)

## 2012-10-28 LAB — GLUCOSE, CAPILLARY
Glucose-Capillary: 105 mg/dL — ABNORMAL HIGH (ref 70–99)
Glucose-Capillary: 110 mg/dL — ABNORMAL HIGH (ref 70–99)
Glucose-Capillary: 156 mg/dL — ABNORMAL HIGH (ref 70–99)
Glucose-Capillary: 156 mg/dL — ABNORMAL HIGH (ref 70–99)
Glucose-Capillary: 97 mg/dL (ref 70–99)

## 2012-10-28 LAB — CBC
MCH: 31.2 pg (ref 26.0–34.0)
Platelets: 185 10*3/uL (ref 150–400)
RBC: 4.3 MIL/uL (ref 3.87–5.11)
RDW: 14.4 % (ref 11.5–15.5)
WBC: 11.9 10*3/uL — ABNORMAL HIGH (ref 4.0–10.5)

## 2012-10-28 MED ORDER — FUROSEMIDE 10 MG/ML IJ SOLN
40.0000 mg | Freq: Once | INTRAMUSCULAR | Status: AC
  Administered 2012-10-28: 40 mg via INTRAVENOUS
  Filled 2012-10-28: qty 4

## 2012-10-28 MED ORDER — INSULIN ASPART 100 UNIT/ML ~~LOC~~ SOLN
0.0000 [IU] | Freq: Three times a day (TID) | SUBCUTANEOUS | Status: DC
  Administered 2012-10-28: 3 [IU] via SUBCUTANEOUS
  Administered 2012-10-29 – 2012-10-31 (×5): 2 [IU] via SUBCUTANEOUS

## 2012-10-28 MED ORDER — POTASSIUM CHLORIDE CRYS ER 20 MEQ PO TBCR
40.0000 meq | EXTENDED_RELEASE_TABLET | Freq: Once | ORAL | Status: AC
  Administered 2012-10-28: 40 meq via ORAL
  Filled 2012-10-28: qty 2

## 2012-10-28 MED ORDER — AMIODARONE HCL 200 MG PO TABS
400.0000 mg | ORAL_TABLET | Freq: Every day | ORAL | Status: DC
  Administered 2012-10-28 – 2012-10-29 (×2): 400 mg via ORAL
  Filled 2012-10-28 (×2): qty 2

## 2012-10-28 MED ORDER — BUMETANIDE 1 MG PO TABS
1.0000 mg | ORAL_TABLET | Freq: Every day | ORAL | Status: DC
  Administered 2012-10-28: 1 mg via ORAL
  Filled 2012-10-28 (×2): qty 1

## 2012-10-28 MED ORDER — METOPROLOL TARTRATE 25 MG PO TABS
25.0000 mg | ORAL_TABLET | Freq: Two times a day (BID) | ORAL | Status: DC
  Administered 2012-10-28 – 2012-10-31 (×7): 25 mg via ORAL
  Filled 2012-10-28 (×8): qty 1

## 2012-10-28 NOTE — Progress Notes (Signed)
Stroke Team Progress Note  HISTORY Per admission note: is an 79 y.o. female who was at home with her daughter. She awakened to use the bathroom and was able to ambulate and speak to her daughter. When her daughter got up later in the morning the patient had slurred speech. While they were talking the patient developed left sided weakness as well. EMS was called and the patient was brought in as a code stroke. Initial NIHSS of 21. TPA given.   She was admitted to the neuro ICU for further evaluation and treatment.  SUBJECTIVE Patient sitting up now extubated. Daughter in room.  OBJECTIVE Most recent Vital Signs: Filed Vitals:   10/28/12 0500 10/28/12 0600 10/28/12 0700 10/28/12 0800  BP: 113/34 139/59 134/44 155/62  Pulse: 70 71 71 79  Temp:  98.1 F (36.7 C)    TempSrc:  Axillary    Resp: 18 17 16 18   Height:      Weight:      SpO2: 95% 99% 100% 100%   CBG (last 3)   Recent Labs  10/27/12 2345 10/28/12 0441 10/28/12 0812  GLUCAP 105* 114* 97    IV Fluid Intake:   . sodium chloride 20 mL (10/27/12 0922)    MEDICATIONS  . clopidogrel  75 mg Oral Q lunch  . insulin aspart  0-15 Units Subcutaneous Q4H  . levETIRAcetam  500 mg Intravenous Q12H  . pantoprazole (PROTONIX) IV  40 mg Intravenous QHS   PRN:  acetaminophen, acetaminophen, labetalol, senna-docusate  Diet:  NPO as intubated Activity:  Bedrest DVT Prophylaxis:  ICDs  CLINICALLY SIGNIFICANT STUDIES Basic Metabolic Panel:   Recent Labs Lab 10/27/12 1329 10/28/12 0624  NA 135 136  K 3.9 3.8  CL 94* 96  CO2 24 26  GLUCOSE 168* 112*  BUN 12 12  CREATININE 0.79 0.72  CALCIUM 7.1* 7.2*   Liver Function Tests:   Recent Labs Lab 10/27/12 0550 10/28/12 0624  AST 18 17  ALT 10 9  ALKPHOS 46 50  BILITOT 0.8 1.2  PROT 5.6* 5.7*  ALBUMIN 2.9* 2.8*   CBC:  Recent Labs Lab 10/26/12 0650  10/27/12 0550 10/28/12 0624  WBC 7.5  --  14.9* 11.9*  NEUTROABS 4.0  --   --   --   HGB 13.3  < > 12.9 13.4   HCT 39.3  < > 38.3 39.5  MCV 90.6  --  91.0 91.9  PLT 327  --  254 185  < > = values in this interval not displayed. Coagulation:   Recent Labs Lab 10/26/12 0650  LABPROT 13.7  INR 1.07   Cardiac Enzymes:   Recent Labs Lab 10/26/12 0650  TROPONINI <0.30   Urinalysis:   Recent Labs Lab 10/26/12 0731  COLORURINE YELLOW  LABSPEC 1.006  PHURINE 5.0  GLUCOSEU NEGATIVE  HGBUR NEGATIVE  BILIRUBINUR NEGATIVE  KETONESUR NEGATIVE  PROTEINUR NEGATIVE  UROBILINOGEN 0.2  NITRITE NEGATIVE  LEUKOCYTESUR NEGATIVE   Lipid Panel    Component Value Date/Time   CHOL 124 10/27/2012 0550   TRIG 87 10/27/2012 0550   HDL 75 10/27/2012 0550   CHOLHDL 1.7 10/27/2012 0550   VLDL 17 10/27/2012 0550   LDLCALC 32 10/27/2012 0550   HgbA1C  Lab Results  Component Value Date   HGBA1C 6.9* 10/27/2012    Urine Drug Screen:     Component Value Date/Time   LABOPIA NONE DETECTED 10/26/2012 0731   COCAINSCRNUR NONE DETECTED 10/26/2012 0731   LABBENZ NONE DETECTED 10/26/2012  0731   AMPHETMU NONE DETECTED 10/26/2012 0731   THCU NONE DETECTED 10/26/2012 0731   LABBARB NONE DETECTED 10/26/2012 0731    Alcohol Level:   Recent Labs Lab 10/26/12 0650  ETH <11    Ct Head Wo Contrast  10/27/2012   *RADIOLOGY REPORT*  Clinical Data: Status post t-PA  CT HEAD WITHOUT CONTRAST  Technique:  Contiguous axial images were obtained from the base of the skull through the vertex without contrast.  Comparison: 10/26/2012  Findings: The bony calvarium is intact.  There are again noted changes consistent with infarcts in the right posterior parietal region.  This area measures approximately 4.6 x 3.8 cm in greatest dimension.  No significant mass effect upon the adjacent ventricle is noted.  Mild chronic white matter ischemic change is again seen. The hyperdensity seen in the branches of the right middle cerebral artery is less well visualized on the current study.  No acute hemorrhages seen.  IMPRESSION: Evolving  right middle cerebral artery infarct.  No acute hemorrhage is noted.  Continued follow-up is recommended as indicated.   Original Report Authenticated By: Alcide Clever, M.D.   Ct Head Wo Contrast  10/26/2012   *RADIOLOGY REPORT*  Clinical Data: Acute right-sided CVA with new onset respiratory failure.  CVA treated with t-PA.Seizure.  CT HEAD WITHOUT CONTRAST  Technique:  Contiguous axial images were obtained from the base of the skull through the vertex without contrast.  Comparison: 10/26/2012.  Findings: Subacute infarction is present in the posterior right MCA territory with decreased attenuation of the gray and white matter. Posterior fossa structures appear within normal limits.  There is no hemorrhage.  Benign basal ganglia calcifications are present. On image number 11, they are this ICU patient in the posterior MCA branch, probably representing thrombosis.  There is no midline shift or hydrocephalus.  The calvarium appears intact.  IMPRESSION: Developing posterior right MCA division infarct.  No hemorrhage or complicating features in this patient with seizure.   Original Report Authenticated By: Andreas Newport, M.D.   Dg Chest Port 1 View  10/28/2012   *RADIOLOGY REPORT*  Clinical Data: Cerebral infarction, respiratory failure and extubation.  PORTABLE CHEST - 1 VIEW  Comparison: 10/27/2012  Findings: Endotracheal and nasogastric tubes been removed. Pacemaker shows stable positioning.  There remain bilateral lower lobe atelectasis/infiltrates with bilateral pleural effusions, right greater than left.  The heart size is stable.  No overt pulmonary edema is seen.  IMPRESSION: Stable bilateral lower lobe consolidation and pleural effusions.   Original Report Authenticated By: Irish Lack, M.D.   Dg Chest Port 1 View  10/27/2012   *RADIOLOGY REPORT*  Clinical Data: Check endotracheal tube  PORTABLE CHEST - 1 VIEW  Comparison: 10/26/2012  Findings: The endotracheal tube and nasogastric catheter are  again identified and stable.  A pacing device is again seen.  Increased density is noted over the bases bilaterally worse than that seen on the prior exam.  This is consistent with bilateral pleural effusions.  Likely underlying atelectasis is present.  No definitive pneumothorax is seen.  IMPRESSION: Increasing bilateral pleural effusions.   Original Report Authenticated By: Alcide Clever, M.D.   Dg Chest Port 1 View  10/26/2012   *RADIOLOGY REPORT*  Clinical Data: Endotracheal tube placement.  Evaluate ET tube.  PORTABLE CHEST - 1 VIEW  Comparison: 10/12/2012.  Findings: The endotracheal tube tip is 31 mm from the carina. Interval intubation.  Enteric tube is present with the tip not visualized.  Defibrillator pads overlie the chest.  Left subclavian dual lead pacemaker.  Right costophrenic angle is excluded from view.  There is lucency in the left upper quadrant suspicious for intra-abdominal free air. Development of bilateral effusions, basilar atelectasis and basilar airspace disease.  IMPRESSION: 1.  Interval intubation with endotracheal tube tip 31 mm from the carina. 2.  Enteric tube present with the tip not visualized. 3.  Possible free intra-abdominal air in the left upper quadrant. Critical Value/emergent results were called by telephone at the time of interpretation on 10/26/2012 at 1519 hours to [facility], RN, who verbally acknowledged these results. 4.  Development of pleural effusions and basilar airspace disease likely representing CHF.   Original Report Authenticated By: Andreas Newport, M.D.    Physical Exam   Mental Status:  Alert, responsive, oriented to name/date/[facility] hospital, follows simple and multistep commands  Cranial Nerves:  PERRL,  Decreased blink to threat on L side, EOMI, no ptosis, left facial droop, decreased hearing bilateral(chronic), cough present.  Motor:  Moves all extremities spontaneously and against light resistance R>>L Increased tone on the left   Sensory:  light touch intact throughout, bilaterally  Deep Tendon Reflexes: 2+ in the upper extremities and absent in the lower extremities  Plantars:  Right: mute Left: mute  Cerebellar:  FTN slow but intact bilat UE   ASSESSMENT [name] is a 79 y.o. female presenting with slurred speech and left sided weakness. Status post IV t-PA on 8/30 at . Head CT shows evolving R MCA ischemic infarct. Based on location/size suspect may be embolic etiology. Anti-platelets held initially 2/2 tPA administration. Was taking ASA 162mg  daily prior to admission. Repeat CT head stable. Hospital stay complicated by GTC seizure requiring intubation  R MCA ischemic infarct: suspect embolic etiology  GTC seizure: suspect provoked 2/2 infarct. Low suspicion due to infectious or other etiology  Diabetes mellitus  Hyperlipidemia  Atrial fibrillation history.  Peripheral vascular disease  PPM  Hospital day # 2  TREATMENT/PLAN  Will start Plavix 75mg  daily. Repeat head CT stable and patient >24hrs since tPA administration. Had stroke on ASA 162mg  daily, therefore aspirin failure  Hold statin as LDL 32   PT/OT/Speech pending. Since intubated after stroke will have speech clear for diet.  For seizures, will start Keppra 500mg  BID, likely will not need long term, felt secondary due to acute stroke.  Gwendolyn Lima. Manson Passey, [facility], MBA, MHA [facility] Pager: [phone] 10/28/2012 9:52 AM  I have personally obtained a history, examined the patient, evaluated imaging results, and formulated the assessment and plan of care. I agree with the above.   Elspeth Cho, DO Neurology-Stroke

## 2012-10-28 NOTE — Progress Notes (Signed)
UR completed 

## 2012-10-28 NOTE — Progress Notes (Signed)
PULMONARY  / CRITICAL CARE MEDICINE  Name: [name] MRN: [id number] DOB: 07-02-1918    ADMISSION DATE:  10/26/2012 CONSULTATION DATE:  10/26/2012  REFERRING MD :  Thad Ranger   PRIMARY SERVICE: Stroke  CHIEF COMPLAINT:  Respiratory failure   BRIEF PATIENT DESCRIPTION:  79 yo admitted to Neuro ICU on 8/30 with acute right hemisphic CVA, s/p TPA with improved left sided weakness. Developed witnessed seizure, became hypoxic and required intubation.  SIGNIFICANT EVENTS / STUDIES:  8/30  Head CT >>> nad 8/30  Seizure/ respiratory failure, intubated 8/30  Head Ct >>>Developing posterior right MCA division infarct. No hemorrhage 8/30  TTE >>> 8/30  Carotid Doppler >>>Right: mild calcific plaque origin ICA. Left: moderate softplaque origin ICA. Unable to adequately visualize ICAs bilaterally. Right vertebral artery flow is antegrade. 8/31 Head CT: Developing posterior right MCA division infarct. No hemorrhage  LINES / TUBES: OETT 8/30 >>>8/31 OGT 8/30 >>>8/31 Foley 8/30 >>>  CULTURES:  ANTIBIOTICS:  SUBJECTIVE: No overnight issues.  VITAL SIGNS: Temp:  [98.1 F (36.7 C)-99.2 F (37.3 C)] 98.1 F (36.7 C) (09/01 0600) Pulse Rate:  [70-95] 79 (09/01 0800) Resp:  [11-36] 18 (09/01 0800) BP: (108-168)/(34-94) 155/62 mmHg (09/01 0800) SpO2:  [90 %-100 %] 100 % (09/01 0800) FiO2 (%):  [30 %] 30 % (08/31 0900)  HEMODYNAMICS:   VENTILATOR SETTINGS: Vent Mode:  [-]  FiO2 (%):  [30 %] 30 %  INTAKE / OUTPUT: Intake/Output     08/31 0701 - 09/01 0700 09/01 0701 - 09/02 0700   I.V. (mL/kg) 680 (7.6)    IV Piggyback 610    Total Intake(mL/kg) 1290 (14.5)    Urine (mL/kg/hr) 1680 (0.8)    Total Output 1680     Net -390           PHYSICAL EXAMINATION: General: awake, very hard of hearing Neuro:  HOH, moves ext, left weaker, oriented X 4 HEENT:  [place], no JVD  Cardiovascular:  Regular irregular, no MRG Lungs:  Clear, decreased right base  Abdomen:+ bowel sounds no OM   Musculoskeletal:  Intact  Skin:  LE edema   LABS: CBC Recent Labs     10/26/12  0650  10/26/12  0658  10/27/12  0550  10/28/12  0624  WBC  7.5   --   14.9*  11.9*  HGB  13.3  14.6  12.9  13.4  HCT  39.3  43.0  38.3  39.5  PLT  327   --   254  185    Coag's Recent Labs     10/26/12  0650  APTT  29  INR  1.07    BMET Recent Labs     10/27/12  0550  10/27/12  1329  10/28/12  0624  NA  137  135  136  K  3.4*  3.9  3.8  CL  96  94*  96  CO2  25  24  26   BUN  13  12  12   CREATININE  0.92  0.79  0.72  GLUCOSE  118*  168*  112*    Electrolytes Recent Labs     10/27/12  0550  10/27/12  1329  10/28/12  0624  CALCIUM  7.5*  7.1*  7.2*    Sepsis Markers No results found for this basename: LACTICACIDVEN, PROCALCITON, O2SATVEN,  in the last 72 hours  ABG Recent Labs     10/26/12  1721  10/27/12  0427  PHART  7.541*  7.509*  PCO2ART  36.8  31.3*  PO2ART  377.0*  91.4    Liver Enzymes Recent Labs     10/26/12  0650  10/27/12  0550  10/28/12  0624  AST  21  18  17   ALT  11  10  9   ALKPHOS  50  46  50  BILITOT  0.7  0.8  1.2  ALBUMIN  3.5  2.9*  2.8*    Cardiac Enzymes Recent Labs     10/26/12  0650  TROPONINI  <0.30    Glucose Recent Labs     10/27/12  1212  10/27/12  1620  10/27/12  1929  10/27/12  2345  10/28/12  0441  10/28/12  0812  GLUCAP  114*  145*  110*  105*  114*  97    Imaging Ct Head Wo Contrast  10/27/2012   *RADIOLOGY REPORT*  Clinical Data: Status post t-PA  CT HEAD WITHOUT CONTRAST  Technique:  Contiguous axial images were obtained from the base of the skull through the vertex without contrast.  Comparison: 10/26/2012  Findings: The bony calvarium is intact.  There are again noted changes consistent with infarcts in the right posterior parietal region.  This area measures approximately 4.6 x 3.8 cm in greatest dimension.  No significant mass effect upon the adjacent ventricle is noted.  Mild chronic white matter ischemic  change is again seen. The hyperdensity seen in the branches of the right middle cerebral artery is less well visualized on the current study.  No acute hemorrhages seen.  IMPRESSION: Evolving right middle cerebral artery infarct.  No acute hemorrhage is noted.  Continued follow-up is recommended as indicated.   Original Report Authenticated By: Alcide Clever, M.D.   Ct Head Wo Contrast  10/26/2012   *RADIOLOGY REPORT*  Clinical Data: Acute right-sided CVA with new onset respiratory failure.  CVA treated with t-PA.Seizure.  CT HEAD WITHOUT CONTRAST  Technique:  Contiguous axial images were obtained from the base of the skull through the vertex without contrast.  Comparison: 10/26/2012.  Findings: Subacute infarction is present in the posterior right MCA territory with decreased attenuation of the gray and white matter. Posterior fossa structures appear within normal limits.  There is no hemorrhage.  Benign basal ganglia calcifications are present. On image number 11, they are this ICU patient in the posterior MCA branch, probably representing thrombosis.  There is no midline shift or hydrocephalus.  The calvarium appears intact.  IMPRESSION: Developing posterior right MCA division infarct.  No hemorrhage or complicating features in this patient with seizure.   Original Report Authenticated By: Andreas Newport, M.D.   Dg Chest Port 1 View  10/28/2012   *RADIOLOGY REPORT*  Clinical Data: Cerebral infarction, respiratory failure and extubation.  PORTABLE CHEST - 1 VIEW  Comparison: 10/27/2012  Findings: Endotracheal and nasogastric tubes been removed. Pacemaker shows stable positioning.  There remain bilateral lower lobe atelectasis/infiltrates with bilateral pleural effusions, right greater than left.  The heart size is stable.  No overt pulmonary edema is seen.  IMPRESSION: Stable bilateral lower lobe consolidation and pleural effusions.   Original Report Authenticated By: Irish Lack, M.D.   Dg Chest Port 1  View  10/27/2012   *RADIOLOGY REPORT*  Clinical Data: Check endotracheal tube  PORTABLE CHEST - 1 VIEW  Comparison: 10/26/2012  Findings: The endotracheal tube and nasogastric catheter are again identified and stable.  A pacing device is again seen.  Increased density is noted over the bases bilaterally worse than that  seen on the prior exam.  This is consistent with bilateral pleural effusions.  Likely underlying atelectasis is present.  No definitive pneumothorax is seen.  IMPRESSION: Increasing bilateral pleural effusions.   Original Report Authenticated By: Alcide Clever, M.D.   Dg Chest Port 1 View  10/26/2012   *RADIOLOGY REPORT*  Clinical Data: Endotracheal tube placement.  Evaluate ET tube.  PORTABLE CHEST - 1 VIEW  Comparison: 10/12/2012.  Findings: The endotracheal tube tip is 31 mm from the carina. Interval intubation.  Enteric tube is present with the tip not visualized.  Defibrillator pads overlie the chest.  Left subclavian dual lead pacemaker.  Right costophrenic angle is excluded from view.  There is lucency in the left upper quadrant suspicious for intra-abdominal free air. Development of bilateral effusions, basilar atelectasis and basilar airspace disease.  IMPRESSION: 1.  Interval intubation with endotracheal tube tip 31 mm from the carina. 2.  Enteric tube present with the tip not visualized. 3.  Possible free intra-abdominal air in the left upper quadrant. Critical Value/emergent results were called by telephone at the time of interpretation on 10/26/2012 at 1519 hours to [facility], RN, who verbally acknowledged these results. 4.  Development of pleural effusions and basilar airspace disease likely representing CHF.   Original Report Authenticated By: Andreas Newport, M.D.   CXR: Right sided effusion/consolidation   ASSESSMENT / PLAN:  PULMONARY A:   Acute respiratory failure in setting of seizure (no subsequent seizures after initial intubation).  Extubated 8/31 Bilateral  R>L effusions.  P:   Lasix today Mobilize Trend CXR, could consider thora if no improvement w/ lasix and diuresis Follow cbc as well as developed fast after tpa IS upright  CARDIOVASCULAR A: AF, controlled rate. P:  Goal SBP <180, consider further reduction as now 48 hrs from tpa = per neuro Resume home meds  tele  RENAL A:  Acute vs chronic renal failure - resolved.   P:   Resume home meds Trend I&O F/u chem   GASTROINTESTINAL A:   No acute issues. P:   Protonix for GI Px Advance diet   HEMATOLOGIC A:   Status post tPA.  Leukocytosis, likely reactive. P:  Trend CBC  SCD for DVT Px  INFECTIOUS A:  No acute issues. P:  No intervention required.  ENDOCRINE A:  DM. Hyperglycemia. P:   SSI  NEUROLOGIC A:   Acute right MCA CVA, s/p TPA. Seizure, witnessed 8/30. Encephalopathy.   P:   Keppra Per Neurology Plavix   Looking better. Could move to SDU Will resume home meds.  Cbc in am  pcxr for further effusion assessment after lasix  BABCOCK,PETE,  10/28/2012, 8:38 AM   BABCOCK,PETE,  10/28/2012, 8:38 AM  I have fully examined this patient and agree with above findings.    And edited i nfull  Mcarthur Rossetti. Tyson Alias, MD, FACP Pgr: [phone] [place] [facility]

## 2012-10-28 NOTE — Evaluation (Signed)
Physical Therapy Evaluation Patient Details Name: [name] MRN: [id number] DOB: 1918/09/30 Today's Date: 10/28/2012 Time: 4098-1191 PT Time Calculation (min): 40 min  PT Assessment / Plan / Recommendation History of Present Illness  Patient is a 79 yo female admitted with Lt sided weakness and slurred speech.  Patient with Rt. CVA and received tPA. Developed witnessed seizure, became hypoxic and required intubation.  Was extubated 10/27/12.  Patient with h/o pacemaker, A-fib, HTN.  Clinical Impression  Patient presents with problems listed below.  Will benefit from acute PT to maximize independence prior to discharge.  Spoke with patient, son and daughter about discharge plan.  Son wanted to wait and "talk about this later".  Daughter spoke with PT in hallway, wanting to know options for discharge.  She lives out of town and wanted to talk with her husband about options to make plans.  At this point, recommend SNF at discharge for continued therapy.      PT Assessment  Patient needs continued PT services    Follow Up Recommendations  SNF;Supervision/Assistance - 24 hour    Does the patient have the potential to tolerate intense rehabilitation      Barriers to Discharge Decreased caregiver support Patient lives alone.  Son works.  Daughter from out of state.    Equipment Recommendations  None recommended by PT    Recommendations for Other Services     Frequency Min 4X/week    Precautions / Restrictions Precautions Precautions: Fall Precaution Comments: Lt weakness and neglect Restrictions Weight Bearing Restrictions: No   Pertinent Vitals/Pain       Mobility  Bed Mobility Bed Mobility: Rolling Left;Left Sidelying to Sit;Sitting - Scoot to Edge of Bed Rolling Left: 4: Min assist;With rail Left Sidelying to Sit: 3: Mod assist;With rails;HOB flat Sitting - Scoot to Edge of Bed: 4: Min guard Details for Bed Mobility Assistance: Verbal cues for technique.  Assist to  bring LE's off of bed and to raise trunk to sitting position.  Patient sat EOB x 5 minutes with min guard assist for balance. Transfers Transfers: Sit to Stand;Stand to [employer] Transfers Sit to Stand: 2: Max assist;With upper extremity assist;From bed Stand to Sit: 3: Mod assist;With upper extremity assist;To bed;With armrests;To chair/3-in-1 Stand Pivot Transfers: 2: Max assist Details for Transfer Assistance: Verbal cues for technique and hand placement.  Patient stood with max assist.  After 5 seconds, patient's knees buckled and she sat abruptly on bed.  Reviewed technique for transfer with patient.  Stood with max assist with flexed posture.  Cues to stand upright.  Patient reached for armrest of chair and was able to pivot feet to get to chair.  Assist to control descent into chair. Ambulation/Gait Ambulation/Gait Assistance: Not tested (comment) Modified Rankin (Stroke Patients Only) Pre-Morbid Rankin Score: Slight disability Modified Rankin: Severe disability    Exercises     PT Diagnosis: Difficulty walking;Generalized weakness;Hemiplegia non-dominant side;Altered mental status  PT Problem List: Decreased strength;Decreased activity tolerance;Decreased balance;Decreased mobility;Decreased cognition;Decreased coordination;Decreased knowledge of use of DME;Impaired sensation PT Treatment Interventions: DME instruction;Gait training;Functional mobility training;Balance training;Cognitive remediation;Patient/family education     PT Goals(Current goals can be found in the care plan section) Acute Rehab PT Goals Patient Stated Goal: To be able to go home PT Goal Formulation: With patient/family Time For Goal Achievement: 11/11/12 Potential to Achieve Goals: Good  Visit Information  Last PT Received On: 10/28/12 Assistance Needed: +2 History of Present Illness: Patient is a 79 yo female admitted with Lt sided  weakness and slurred speech.  Patient with Rt. CVA and received  tPA. Developed witnessed seizure, became hypoxic and required intubation.  Was extubated 10/27/12.  Patient with h/o pacemaker, A-fib, HTN.       Prior Functioning  Home Living Family/patient expects to be discharged to:: Skilled nursing facility Living Arrangements: Alone Available Help at Discharge: Family;Available PRN/intermittently (Son works and daughter in another state.) Type of Home: House Home Access: Stairs to enter Secretary/administrator of Steps: 1 Entrance Stairs-Rails: None Home Layout: One level Home Equipment: Environmental consultant - 4 wheels;Tub bench Additional Comments: Son takes patient for errands (groceries, hair, church) Prior Function Level of Independence: Independent with assistive device(s);Needs assistance Gait / Transfers Assistance Needed: uses rollator in home, limited outside amb - someones accompanies pt ADL's / Homemaking Assistance Needed: son does grocery shopping, pt able to do light meals Communication / Swallowing Assistance Needed: no Communication Communication: HOH (wears hearing aid) Dominant Hand: Right    Cognition  Cognition Arousal/Alertness: Lethargic Behavior During Therapy: Flat affect Overall Cognitive Status: Impaired/Different from baseline Area of Impairment: Attention;Awareness;Orientation Orientation Level: Disoriented to;Situation (Able to state she had a stroke. Otherwise unaware) Current Attention Level: Sustained (Needed redirection - lethargic)    Extremity/Trunk Assessment Upper Extremity Assessment Upper Extremity Assessment: LUE deficits/detail LUE Deficits / Details: Strength slightly decreased to 3/5.  Exhibiting Lt neglect. LUE Sensation: decreased light touch LUE Coordination: decreased gross motor Lower Extremity Assessment Lower Extremity Assessment: Generalized weakness;LLE deficits/detail LLE Deficits / Details: Strength grossly 3+/5 LLE Sensation: decreased light touch LLE Coordination: decreased gross motor    Balance Balance Balance Assessed: Yes Static Sitting Balance Static Sitting - Balance Support: Right upper extremity supported;Feet supported Static Sitting - Level of Assistance: 5: Stand by assistance Static Sitting - Comment/# of Minutes: 5 minutes.  Able to raise UE's and maintain balance for 15 seconds.  End of Session PT - End of Session Equipment Utilized During Treatment: Gait belt Activity Tolerance: Patient limited by lethargy;Patient limited by fatigue Patient left: in chair;with call bell/phone within reach;with family/visitor present Nurse Communication: Mobility status  GP     Vena Austria 10/28/2012, 3:46 PM Durenda Hurt. Renaldo Fiddler, [facility] Acute Rehab Services Pager [phone]

## 2012-10-28 NOTE — Evaluation (Signed)
Clinical/Bedside Swallow Evaluation Patient Details  Name: [name] MRN: [id number] Date of Birth: [birth year]  Today's Date: 10/28/2012 Time: 4098-1191 SLP Time Calculation (min): 20 min  Past Medical History:  Past Medical History  Diagnosis Date  . Diabetes mellitus   . Hypertension   . Hyperlipidemia   . Arthritis   . AF (atrial fibrillation)   . Cataract     decreased vision in left eye  . Peripheral vascular disease   . CHF (congestive heart failure)    Past Surgical History:  Past Surgical History  Procedure Laterality Date  . Pacemaker insertion  2008  . Cholecystectomy    . Thyroid surgery     HPI:  79 yo admitted to Neuro ICU on 8/30 with acute right hemisphic MCA CVA, s/p TPA with improved left sided weakness. Developed witnessed seizure, became hypoxic and required intubation.   Assessment / Plan / Recommendation Clinical Impression  Patient presents with a functional appearing oropharyngeal swallow with subtle s/s of aspiration characterized by intermittent throat clearing, primarily with liquids. Question origin; h/o chronic GER (per patient) vs recent brief intubation. Overall, patient appears to be protecting airway. Education complete with patient and family members regarding risk of aspiration, s/s of aspiration, and precautions. Recommend continuation of current diet with intermittent staff supervision and brief SLP f/u to ensure tolerance given subtle indication of difficulty.     Aspiration Risk  Mild    Diet Recommendation Regular;Thin liquid   Liquid Administration via: Cup;Straw Medication Administration: Whole meds with liquid Supervision: Patient able to self feed;Intermittent supervision to cue for compensatory strategies Compensations: Slow rate;Small sips/bites Postural Changes and/or Swallow Maneuvers: Seated upright 90 degrees    Other  Recommendations Oral Care Recommendations: Oral care BID   Follow Up Recommendations  None     Frequency and Duration min 2x/week  1 week   Pertinent Vitals/Pain None     SLP Swallow Goals Patient will utilize recommended strategies during swallow to increase swallowing safety with: Supervision/safety   Swallow Study    General HPI: 79 yo admitted to Neuro ICU on 8/30 with acute right hemisphic MCA CVA, s/p TPA with improved left sided weakness. Developed witnessed seizure, became hypoxic and required intubation. Type of Study: Bedside swallow evaluation Previous Swallow Assessment: none Diet Prior to this Study: Regular;Thin liquids Temperature Spikes Noted: No Respiratory Status: Room air History of Recent Intubation: Yes Length of Intubations (days): 1 days Date extubated: 10/27/12 Behavior/Cognition: Alert;Cooperative;Pleasant mood;Hard of hearing Oral Cavity - Dentition: Adequate natural dentition Self-Feeding Abilities: Able to feed self Patient Positioning: Upright in bed Baseline Vocal Quality: Hoarse Volitional Cough: Strong Volitional Swallow: Able to elicit    Oral/Motor/Sensory Function Overall Oral Motor/Sensory Function: Appears within functional limits for tasks assessed   Ice Chips Ice chips: Not tested   Thin Liquid Thin Liquid: Impaired Presentation: Cup;Straw;Self Fed Pharyngeal  Phase Impairments: Throat Clearing - Immediate (inconsistent)    Nectar Thick Nectar Thick Liquid: Not tested   Honey Thick Honey Thick Liquid: Not tested   Puree Puree: Impaired Presentation: Spoon;Self Fed Pharyngeal Phase Impairments: Other (comments) (expectoration of bolus x1)   Solid   GO   Juana Haralson MA, CCC-SLP [phone]  Solid: Within functional limits Presentation: Spoon;Self Fed       Brieonna Crutcher Meryl 10/28/2012,12:27 PM

## 2012-10-29 ENCOUNTER — Inpatient Hospital Stay (HOSPITAL_COMMUNITY): Payer: Medicare Other

## 2012-10-29 DIAGNOSIS — R569 Unspecified convulsions: Secondary | ICD-10-CM

## 2012-10-29 DIAGNOSIS — I6330 Cerebral infarction due to thrombosis of unspecified cerebral artery: Secondary | ICD-10-CM

## 2012-10-29 LAB — GLUCOSE, CAPILLARY
Glucose-Capillary: 106 mg/dL — ABNORMAL HIGH (ref 70–99)
Glucose-Capillary: 133 mg/dL — ABNORMAL HIGH (ref 70–99)

## 2012-10-29 LAB — CBC
MCHC: 34.4 g/dL (ref 30.0–36.0)
RDW: 13.8 % (ref 11.5–15.5)
WBC: 9.8 10*3/uL (ref 4.0–10.5)

## 2012-10-29 LAB — BASIC METABOLIC PANEL
BUN: 13 mg/dL (ref 6–23)
Creatinine, Ser: 0.78 mg/dL (ref 0.50–1.10)
GFR calc Af Amer: 82 mL/min — ABNORMAL LOW (ref >90)
GFR calc non Af Amer: 71 mL/min — ABNORMAL LOW (ref >90)
Potassium: 3.4 mEq/L — ABNORMAL LOW (ref 3.5–5.1)

## 2012-10-29 MED ORDER — APIXABAN 2.5 MG PO TABS
2.5000 mg | ORAL_TABLET | Freq: Two times a day (BID) | ORAL | Status: DC
  Administered 2012-10-29 – 2012-10-31 (×5): 2.5 mg via ORAL
  Filled 2012-10-29 (×6): qty 1

## 2012-10-29 MED ORDER — BUMETANIDE 1 MG PO TABS
1.0000 mg | ORAL_TABLET | Freq: Every day | ORAL | Status: DC
  Administered 2012-10-29: 1 mg via ORAL
  Filled 2012-10-29 (×3): qty 1

## 2012-10-29 MED ORDER — FUROSEMIDE 10 MG/ML IJ SOLN
40.0000 mg | Freq: Two times a day (BID) | INTRAMUSCULAR | Status: DC
  Administered 2012-10-29 – 2012-10-31 (×5): 40 mg via INTRAVENOUS
  Filled 2012-10-29 (×5): qty 4

## 2012-10-29 MED ORDER — METOPROLOL TARTRATE 25 MG PO TABS: 25.0000 mg | ORAL_TABLET | Freq: Two times a day (BID) | ORAL | Status: DC

## 2012-10-29 MED ORDER — AMIODARONE HCL 200 MG PO TABS: 400.0000 mg | ORAL_TABLET | Freq: Every day | ORAL | Status: DC

## 2012-10-29 MED ORDER — SIMVASTATIN 40 MG PO TABS
40.0000 mg | ORAL_TABLET | Freq: Every day | ORAL | Status: DC
  Filled 2012-10-29: qty 1

## 2012-10-29 MED ORDER — FUROSEMIDE 10 MG/ML IJ SOLN: 40.0000 mg | Freq: Three times a day (TID) | INTRAMUSCULAR | Status: DC

## 2012-10-29 MED ORDER — ATORVASTATIN CALCIUM 20 MG PO TABS
20.0000 mg | ORAL_TABLET | Freq: Every day | ORAL | Status: DC
  Administered 2012-10-29 – 2012-10-30 (×2): 20 mg via ORAL
  Filled 2012-10-29 (×4): qty 1

## 2012-10-29 MED ORDER — AMIODARONE HCL 200 MG PO TABS
400.0000 mg | ORAL_TABLET | Freq: Two times a day (BID) | ORAL | Status: DC
  Administered 2012-10-29 – 2012-10-31 (×4): 400 mg via ORAL
  Filled 2012-10-29 (×5): qty 2

## 2012-10-29 MED ORDER — FUROSEMIDE 10 MG/ML IJ SOLN: 40.0000 mg | Freq: Two times a day (BID) | INTRAMUSCULAR | Status: DC

## 2012-10-29 MED ORDER — SIMVASTATIN 40 MG PO TABS: 40.0000 mg | ORAL_TABLET | Freq: Every evening | ORAL | Status: DC

## 2012-10-29 MED FILL — Medication: Qty: 1 | Status: AC

## 2012-10-29 NOTE — Evaluation (Signed)
Speech Language Pathology Evaluation Patient Details Name: [name] MRN: [id number] DOB: 02/19/1919 Today's Date: 10/29/2012 Time: 1400-1410 SLP Time Calculation (min): 10 min  Problem List:  Patient Active Problem List   Diagnosis Date Noted  . Cerebral embolism with cerebral infarction 10/26/2012  . Seizure 10/26/2012  . Acute respiratory failure 10/26/2012  . Intracranial hematoma 10/26/2012  . Acute combined systolic and diastolic congestive heart failure 10/12/2012  . Hyponatremia 10/12/2012  . A-fib 10/12/2012  . Diabetes 10/12/2012  . Hyperlipidemia 10/12/2012  . Atherosclerosis of native arteries of the extremities with gangrene 04/10/2011  . Swelling of limb 04/10/2011   Past Medical History:  Past Medical History  Diagnosis Date  . Diabetes mellitus   . Hypertension   . Hyperlipidemia   . Arthritis   . AF (atrial fibrillation)   . Cataract     decreased vision in left eye  . Peripheral vascular disease   . CHF (congestive heart failure)    Past Surgical History:  Past Surgical History  Procedure Laterality Date  . Pacemaker insertion  2008  . Cholecystectomy    . Thyroid surgery     HPI:  79 yo admitted to Neuro ICU on 8/30 with acute right hemisphic MCA CVA, s/p TPA with improved left sided weakness. Developed witnessed seizure, became hypoxic and required intubation.   Assessment / Plan / Recommendation Clinical Impression  Pt presents with mild cognitive deficits in higher level functional problem solving and reasoning and with left field neglect. Will benefit from f/u with SLP to aid pt with compensatory strategies and increase independence with ADL's. Agree with CIR consult, SNF if necessary.     SLP Assessment  Patient needs continued Speech Lanaguage Pathology Services    Follow Up Recommendations  Inpatient Rehab    Frequency and Duration min 2x/week  2 weeks   Pertinent Vitals/Pain NA   SLP Goals  SLP Goals Potential to Achieve  Goals: Good SLP Goal #1: Pt will complete visual scanning task with min verbal cues to scan left to right with 80% accuracy.  SLP Goal #1 - Progress: Progressing toward goal SLP Goal #2: Pt will complete functional problem solving task (counting/organizing money) with min verbal cues.  SLP Goal #2 - Progress: Progressing toward goal  SLP Evaluation Prior Functioning  Cognitive/Linguistic Baseline: Within functional limits Type of Home: House  Lives With: Alone Available Help at Discharge: Family;Available PRN/intermittently Vocation: Retired   IT consultant  Overall Cognitive Status: Impaired/Different from baseline Arousal/Alertness: Awake/alert Orientation Level: Oriented X4 Attention: Focused;Sustained Focused Attention: Appears intact Sustained Attention: Appears intact Memory: Appears intact Awareness: Impaired Awareness Impairment: Emergent impairment Problem Solving: Impaired Problem Solving Impairment: Functional complex Executive Function: Reasoning;Self Monitoring Reasoning: Impaired Reasoning Impairment: Functional complex Self Monitoring: Impaired Self Monitoring Impairment: Functional complex Safety/Judgment: Impaired    Comprehension  Auditory Comprehension Overall Auditory Comprehension: Appears within functional limits for tasks assessed Yes/No Questions: Within Functional Limits Commands: Within Functional Limits    Expression Verbal Expression Overall Verbal Expression: Appears within functional limits for tasks assessed   Oral / Motor Oral Motor/Sensory Function Overall Oral Motor/Sensory Function: Appears within functional limits for tasks assessed   GO     Subrina Vecchiarelli, Riley Nearing 10/29/2012, 3:04 PM

## 2012-10-29 NOTE — Progress Notes (Signed)
Admit date: 10/26/2012 Referring Physician  Dr. Pearlean Brownie Primary Physician  Dr. Jillyn Hidden Primary Cardiologist  Eldridge Dace Reason for Consultation  Embolic stroke  HPI: 79 y/o w/ h/o AFib who had been on Coumadin until 2012, when she sufferred several falls.  She has been on aspirin since that time.  Her sotalol was stopped a few weeks ago due to prolonqed QT interval.  She went out of rhythm and has had issues with rapid ventricular response.  Amiodarone was started along with rate control.  She remains in atrial fibrillation but unfortunately suffered an embolic stroke affecting several different areas of her brain.  We are asked to comment on management of her atrial fibrillation, diastolic heart failure and anticoagulation.  Currently, she feels much better.  She denies any palpitations or chest pain.  She is not short of breath.  Her leg swelling is much better.     PMH:   Past Medical History  Diagnosis Date  . Diabetes mellitus   . Hypertension   . Hyperlipidemia   . Arthritis   . AF (atrial fibrillation)   . Cataract     decreased vision in left eye  . Peripheral vascular disease   . CHF (congestive heart failure)      PSH:   Past Surgical History  Procedure Laterality Date  . Pacemaker insertion  2008  . Cholecystectomy    . Thyroid surgery      Allergies:  Nitrofurantoin and Sulfa antibiotics Prior to Admit Meds:   Prescriptions prior to admission  Medication Sig Dispense Refill  . amiodarone (PACERONE) 400 MG tablet Take 400 mg by mouth daily.      . bumetanide (BUMEX) 1 MG tablet Take 1 mg by mouth daily.      . metoprolol tartrate (LOPRESSOR) 25 MG tablet Take 25 mg by mouth 2 (two) times daily.      . simvastatin (ZOCOR) 40 MG tablet Take 40 mg by mouth every evening.      . [DISCONTINUED] aspirin 81 MG tablet Take 160 mg by mouth daily.      . [DISCONTINUED] beta carotene w/minerals (OCUVITE) tablet Take 1 tablet by mouth daily.      . [DISCONTINUED] cyanocobalamin 500  MCG tablet Take 500 mcg by mouth every morning.      . [DISCONTINUED] furosemide (LASIX) 20 MG tablet Take 1 tablet (20 mg total) by mouth daily.  30 tablet  0  . [DISCONTINUED] lisinopril (PRINIVIL,ZESTRIL) 10 MG tablet Take 1 tablet (10 mg total) by mouth daily.  30 tablet  0  . [DISCONTINUED] metFORMIN (GLUCOPHAGE-XR) 500 MG 24 hr tablet Take 500 mg by mouth 3 (three) times daily.       . [DISCONTINUED] ondansetron (ZOFRAN) 4 MG tablet Take 4 mg by mouth every 8 (eight) hours as needed for nausea.       Fam HX:   No family history on file. Social HX:    History   Social History  . Marital Status: Married    Spouse Name: N/A    Number of Children: N/A  . Years of Education: N/A   Occupational History  . Not on file.   Social History Main Topics  . Smoking status: Never Smoker   . Smokeless tobacco: Never Used  . Alcohol Use: No  . Drug Use: No  . Sexual Activity: Not on file   Other Topics Concern  . Not on file   Social History Narrative  . No narrative on file  ROS:  All 11 ROS were addressed and are negative except what is stated in the HPI  Physical Exam: Blood pressure 140/60, pulse 90, temperature 97.8 F (36.6 C), temperature source Oral, resp. rate 18, height 5\' 2"  (1.575 m), weight 89 kg (196 lb 3.4 oz), SpO2 94.00%.    General: Well developed, well nourished, in no acute distress Head:   Normal cephalic and atramatic  Lungs:   Clear bilaterally to auscultation and percussion. Heart:   Irregularly irregular, S1 S2             No carotid bruit. No JVD.  No abdominal bruits. No femoral bruits. Abdomen:  abdomen soft and non-tender   Extremities:   No edema.  DP +1 Neuro: Alert and oriented . Psych:  Normal affect, responds appropriately    Labs:   Lab Results  Component Value Date   WBC 9.8 10/29/2012   HGB 14.6 10/29/2012   HCT 42.5 10/29/2012   MCV 90.2 10/29/2012   PLT 260 10/29/2012    Recent Labs Lab 10/28/12 0624 10/29/12 0945  NA 136 134*  K  3.8 3.4*  CL 96 94*  CO2 26 26  BUN 12 13  CREATININE 0.72 0.78  CALCIUM 7.2* 7.8*  PROT 5.7*  --   BILITOT 1.2  --   ALKPHOS 50  --   ALT 9  --   AST 17  --   GLUCOSE 112* 146*   No results found for this basename: PTT   Lab Results  Component Value Date   INR 1.07 10/26/2012   INR 1.84* 08/19/2009   INR 2.3* 11/09/2007   Lab Results  Component Value Date   CKTOTAL 35 07/01/2010   CKMB 1.9 07/01/2010   TROPONINI <0.30 10/26/2012     Lab Results  Component Value Date   CHOL 124 10/27/2012   Lab Results  Component Value Date   HDL 75 10/27/2012   Lab Results  Component Value Date   LDLCALC 32 10/27/2012   Lab Results  Component Value Date   TRIG 87 10/27/2012   Lab Results  Component Value Date   CHOLHDL 1.7 10/27/2012   No results found for this basename: LDLDIRECT      Radiology:  Dg Chest Port 1 View  10/29/2012   *RADIOLOGY REPORT*  Clinical Data: Right pleural effusion.  PORTABLE CHEST - 1 VIEW  Comparison: Plain film chest 10/27/2012 and 10/28/2012.  Findings: The patient's right pleural effusion and associated atelectasis appear improved.  There has been interval worsening of the patient's left pleural effusion and atelectasis.  Cardiomegaly is noted.  No pneumothorax.  IMPRESSION: Decreased right and increased left pleural effusions and atelectasis.   Original Report Authenticated By: Holley Dexter, M.D.   Dg Chest Port 1 View  10/28/2012   *RADIOLOGY REPORT*  Clinical Data: Cerebral infarction, respiratory failure and extubation.  PORTABLE CHEST - 1 VIEW  Comparison: 10/27/2012  Findings: Endotracheal and nasogastric tubes been removed. Pacemaker shows stable positioning.  There remain bilateral lower lobe atelectasis/infiltrates with bilateral pleural effusions, right greater than left.  The heart size is stable.  No overt pulmonary edema is seen.  IMPRESSION: Stable bilateral lower lobe consolidation and pleural effusions.   Original Report Authenticated By: Irish Lack, M.D.    EKG:  Sinus rhythm, ST, T-wave changes in the anterior leads  ASSESSMENT: Acute embolic CVA likely related to paroxysmal atrial fibrillation.  Anticoagulation stopped in 2012 do to multiple falls.  PLAN:  Will increase amiodarone  400 mg twice a day while she is in the hospital, to try and maintain sinus rhythm.  Continue metoprolol as well.  Despite falls risk, we're now forced to anticoagulate.  Agree with using Eliquis 2.5 mg by mouth twice a day given her age.  She appears close to being euvolemic.  May be able to back off Lasix tomorrow.  Apparently, plan is for her to go to rehabilitation prior to going home.  Corky Crafts., MD  10/29/2012  1:39 PM

## 2012-10-29 NOTE — Progress Notes (Signed)
PULMONARY  / CRITICAL CARE MEDICINE  Name: [name] MRN: [id number] DOB: 1918/10/08    ADMISSION DATE:  10/26/2012 CONSULTATION DATE:  10/26/2012  REFERRING MD :  Thad Ranger   PRIMARY SERVICE: Stroke  CHIEF COMPLAINT:  Respiratory failure   BRIEF PATIENT DESCRIPTION:  79 yo admitted to Neuro ICU on 8/30 with acute right hemisphic CVA, s/p TPA with improved left sided weakness. Developed witnessed seizure, became hypoxic and required intubation.  SIGNIFICANT EVENTS / STUDIES:  8/30  Head CT >>> nad 8/30  Seizure/ respiratory failure, intubated 8/30  Head Ct >>>Developing posterior right MCA division infarct. No hemorrhage 8/30  TTE >>> 8/30  Carotid Doppler >>>Right: mild calcific plaque origin ICA. Left: moderate softplaque origin ICA. Unable to adequately visualize ICAs bilaterally. Right vertebral artery flow is antegrade. 8/31 Head CT: Developing posterior right MCA division infarct. No hemorrhage  LINES / TUBES: OETT 8/30 >>>8/31 OGT 8/30 >>>8/31 Foley 8/30 >>>  CULTURES:  ANTIBIOTICS:  SUBJECTIVE:  10/29/12 - doing well post extubation. Bialteral Efufsions improved. Neuro says she can got to tele under [facility]  VITAL SIGNS: Temp:  [97.5 F (36.4 C)-98.7 F (37.1 C)] 97.8 F (36.6 C) (09/02 0700) Pulse Rate:  [65-106] 95 (09/02 0905) Resp:  [10-25] 18 (09/02 0905) BP: (80-160)/(17-83) 117/64 mmHg (09/02 0905) SpO2:  [91 %-100 %] 98 % (09/02 0905)  HEMODYNAMICS:   VENTILATOR SETTINGS:    INTAKE / OUTPUT: Intake/Output     09/01 0701 - 09/02 0700 09/02 0701 - 09/03 0700   P.O. 360    I.V. (mL/kg) 10 (0.1)    IV Piggyback 205    Total Intake(mL/kg) 575 (6.5)    Urine (mL/kg/hr) 990 (0.5) 75 (0.3)   Total Output 990 75   Net -415 -75         PHYSICAL EXAMINATION: General: awake, very hard of hearing Neuro:  HOH, moves ext, left weaker very mild only fine motor - this is much improved, oriented X 4 HEENT:  [place], no JVD  Cardiovascular:  Regular irregular,  no MRG Lungs:  Clear, decreased right base  Abdomen:+ bowel sounds no OM  Musculoskeletal:  Intact  Skin:  LE edema    PULMONARY  Recent Labs Lab 10/26/12 0658 10/26/12 1721 10/27/12 0427  PHART  --  7.541* 7.509*  PCO2ART  --  36.8 31.3*  PO2ART  --  377.0* 91.4  HCO3  --  31.7* 24.7*  TCO2 30 33 25.6  O2SAT  --  100.0 98.4    CBC  Recent Labs Lab 10/26/12 0650 10/26/12 0658 10/27/12 0550 10/28/12 0624  HGB 13.3 14.6 12.9 13.4  HCT 39.3 43.0 38.3 39.5  WBC 7.5  --  14.9* 11.9*  PLT 327  --  254 185    COAGULATION  Recent Labs Lab 10/26/12 0650  INR 1.07    CARDIAC   Recent Labs Lab 10/26/12 0650  TROPONINI <0.30   No results found for this basename: PROBNP,  in the last 168 hours   CHEMISTRY  Recent Labs Lab 10/26/12 0650 10/26/12 0658 10/27/12 0550 10/27/12 1329 10/28/12 0624  NA 134* 135 137 135 136  K 3.3* 3.2* 3.4* 3.9 3.8  CL 92* 92* 96 94* 96  CO2 27  --  25 24 26   GLUCOSE 166* 165* 118* 168* 112*  BUN 15 15 13 12 12   CREATININE 1.17* 1.40* 0.92 0.79 0.72  CALCIUM 8.1*  --  7.5* 7.1* 7.2*   Estimated Creatinine Clearance: 47.5 ml/min (by C-G formula  based on Cr of 0.72).   LIVER  Recent Labs Lab 10/26/12 0650 10/27/12 0550 10/28/12 0624  AST 21 18 17   ALT 11 10 9   ALKPHOS 50 46 50  BILITOT 0.7 0.8 1.2  PROT 6.5 5.6* 5.7*  ALBUMIN 3.5 2.9* 2.8*  INR 1.07  --   --      INFECTIOUS No results found for this basename: LATICACIDVEN, PROCALCITON,  in the last 168 hours   ENDOCRINE CBG (last 3)   Recent Labs  10/28/12 2200 10/29/12 0400 10/29/12 0749  GLUCAP 156* 104* 106*         IMAGING x48h  Dg Chest Port 1 View  10/29/2012   *RADIOLOGY REPORT*  Clinical Data: Right pleural effusion.  PORTABLE CHEST - 1 VIEW  Comparison: Plain film chest 10/27/2012 and 10/28/2012.  Findings: The patient's right pleural effusion and associated atelectasis appear improved.  There has been interval worsening of the  patient's left pleural effusion and atelectasis.  Cardiomegaly is noted.  No pneumothorax.  IMPRESSION: Decreased right and increased left pleural effusions and atelectasis.   Original Report Authenticated By: Holley Dexter, M.D.   Dg Chest Port 1 View  10/28/2012   *RADIOLOGY REPORT*  Clinical Data: Cerebral infarction, respiratory failure and extubation.  PORTABLE CHEST - 1 VIEW  Comparison: 10/27/2012  Findings: Endotracheal and nasogastric tubes been removed. Pacemaker shows stable positioning.  There remain bilateral lower lobe atelectasis/infiltrates with bilateral pleural effusions, right greater than left.  The heart size is stable.  No overt pulmonary edema is seen.  IMPRESSION: Stable bilateral lower lobe consolidation and pleural effusions.   Original Report Authenticated By: Irish Lack, M.D.      Imaging Dg Chest Port 1 View  10/29/2012   *RADIOLOGY REPORT*  Clinical Data: Right pleural effusion.  PORTABLE CHEST - 1 VIEW  Comparison: Plain film chest 10/27/2012 and 10/28/2012.  Findings: The patient's right pleural effusion and associated atelectasis appear improved.  There has been interval worsening of the patient's left pleural effusion and atelectasis.  Cardiomegaly is noted.  No pneumothorax.  IMPRESSION: Decreased right and increased left pleural effusions and atelectasis.   Original Report Authenticated By: Holley Dexter, M.D.   Dg Chest Port 1 View  10/28/2012   *RADIOLOGY REPORT*  Clinical Data: Cerebral infarction, respiratory failure and extubation.  PORTABLE CHEST - 1 VIEW  Comparison: 10/27/2012  Findings: Endotracheal and nasogastric tubes been removed. Pacemaker shows stable positioning.  There remain bilateral lower lobe atelectasis/infiltrates with bilateral pleural effusions, right greater than left.  The heart size is stable.  No overt pulmonary edema is seen.  IMPRESSION: Stable bilateral lower lobe consolidation and pleural effusions.   Original Report  Authenticated By: Irish Lack, M.D.   CXR: Right sided effusion/consolidation   ASSESSMENT / PLAN:  PULMONARY A:   Acute respiratory failure in setting of seizure (no subsequent seizures after initial intubation).  Extubated 8/31 Bilateral R>L effusions.   10/29/12: doing well post extubation. CXR with bilateral effusin related to diastolic chf  P:   Lasix  Daily; hild for sbo < 100 per neuro Mobilize IS Monitor effusions with periodic cxr  CARDIOVASCULAR A: AF, controlled rate and Gr3 diastolic cHF  P:  Goal SBP > 110 pper neuro.  Resume home meds  tele  RENAL A:  Acute vs chronic renal failure - resolved.   P:   Resume home meds Trend I&O F/u chem   GASTROINTESTINAL A:   No acute issues. P:   Protonix for GI Px  Advance diet   HEMATOLOGIC A:   Status post tPA.  Leukocytosis, likely reactive. P:  Trend CBC  SCD for DVT Px  INFECTIOUS A:  No acute issues. P:  No intervention required.  ENDOCRINE A:  DM. Hyperglycemia. P:   SSI  NEUROLOGIC A:   Acute right MCA CVA, s/p TPA. Seizure, witnessed 8/30. Encephalopathy.   P:   Keppra Per Neurology Plavix    GLOBAL 10/29/12: PCCM will sign off. Asked [facility] [place] to assumer primary from neuro. Duaghter updated. KEEP FOLEY   Dr. Kalman Shan, M.D., [facility].C.P Pulmonary and Critical Care Medicine Staff Physician [facility] [place] Pager: [phone], If no answer or between  15:00h - 7:00h: call [phone]  10/29/2012 9:48 AM

## 2012-10-29 NOTE — Progress Notes (Signed)
Housekeeping note  Dc bumex Diurese with IV lasix When better go back to bumex which is convenient to patient per dtr  Dr. Kalman Shan, M.D., [facility].C.P Pulmonary and Critical Care Medicine Staff Physician [facility] [place] [facility] Pager: [phone], If no answer or between  15:00h - 7:00h: call [phone]  10/29/2012 12:51 PM

## 2012-10-29 NOTE — Progress Notes (Signed)
Stroke Team Progress Note  HISTORY Per admission note: is an 79 y.o. female who was at home with her daughter. She awakened to use the bathroom and was able to ambulate and speak to her daughter. When her daughter got up later in the morning the patient had slurred speech. While they were talking the patient developed left sided weakness as well. EMS was called and the patient was brought in as a code stroke. Initial NIHSS of 21. TPA given.   She was admitted to the neuro ICU for further evaluation and treatment.  SUBJECTIVE Patient sitting up now extubated. Family in room. Able to follow commands.  OBJECTIVE Most recent Vital Signs: Filed Vitals:   10/29/12 0402 10/29/12 0500 10/29/12 0600 10/29/12 0700  BP:  138/42 133/55   Pulse:  65 72   Temp: 97.9 F (36.6 C)   97.8 F (36.6 C)  TempSrc: Oral   Oral  Resp:  17 17   Height:      Weight:      SpO2:  100% 97%    CBG (last 3)   Recent Labs  10/28/12 1602 10/28/12 2200 10/29/12 0400  GLUCAP 156* 156* 104*    IV Fluid Intake:   . sodium chloride 20 mL (10/27/12 0922)    MEDICATIONS  . amiodarone  400 mg Oral Daily  . bumetanide  1 mg Oral Daily  . clopidogrel  75 mg Oral Q lunch  . insulin aspart  0-15 Units Subcutaneous TID WC & HS  . levETIRAcetam  500 mg Intravenous Q12H  . metoprolol tartrate  25 mg Oral BID   PRN:  acetaminophen, acetaminophen, senna-docusate  Diet:  Regular thin liquids Activity:  Up as tolerated DVT Prophylaxis:  SCD  CLINICALLY SIGNIFICANT STUDIES Basic Metabolic Panel:   Recent Labs Lab 10/27/12 1329 10/28/12 0624  NA 135 136  K 3.9 3.8  CL 94* 96  CO2 24 26  GLUCOSE 168* 112*  BUN 12 12  CREATININE 0.79 0.72  CALCIUM 7.1* 7.2*   Liver Function Tests:   Recent Labs Lab 10/27/12 0550 10/28/12 0624  AST 18 17  ALT 10 9  ALKPHOS 46 50  BILITOT 0.8 1.2  PROT 5.6* 5.7*  ALBUMIN 2.9* 2.8*   CBC:  Recent Labs Lab 10/26/12 0650  10/27/12 0550 10/28/12 0624  WBC 7.5   --  14.9* 11.9*  NEUTROABS 4.0  --   --   --   HGB 13.3  < > 12.9 13.4  HCT 39.3  < > 38.3 39.5  MCV 90.6  --  91.0 91.9  PLT 327  --  254 185  < > = values in this interval not displayed. Coagulation:   Recent Labs Lab 10/26/12 0650  LABPROT 13.7  INR 1.07   Cardiac Enzymes:   Recent Labs Lab 10/26/12 0650  TROPONINI <0.30   Urinalysis:   Recent Labs Lab 10/26/12 0731  COLORURINE YELLOW  LABSPEC 1.006  PHURINE 5.0  GLUCOSEU NEGATIVE  HGBUR NEGATIVE  BILIRUBINUR NEGATIVE  KETONESUR NEGATIVE  PROTEINUR NEGATIVE  UROBILINOGEN 0.2  NITRITE NEGATIVE  LEUKOCYTESUR NEGATIVE   Lipid Panel    Component Value Date/Time   CHOL 124 10/27/2012 0550   TRIG 87 10/27/2012 0550   HDL 75 10/27/2012 0550   CHOLHDL 1.7 10/27/2012 0550   VLDL 17 10/27/2012 0550   LDLCALC 32 10/27/2012 0550   HgbA1C  Lab Results  Component Value Date   HGBA1C 6.9* 10/27/2012    Urine Drug Screen:  Component Value Date/Time   LABOPIA NONE DETECTED 10/26/2012 0731   COCAINSCRNUR NONE DETECTED 10/26/2012 0731   LABBENZ NONE DETECTED 10/26/2012 0731   AMPHETMU NONE DETECTED 10/26/2012 0731   THCU NONE DETECTED 10/26/2012 0731   LABBARB NONE DETECTED 10/26/2012 0731    Alcohol Level:   Recent Labs Lab 10/26/12 0650  ETH <11    Dg Chest Port 1 View 10/29/2012  Decreased right and increased left pleural effusions and atelectasis.     Dg Chest Por 1 View 10/28/2012   : Stable bilateral lower lobe consolidation and pleural effusions.     Physical Exam   Mental Status:  Alert, responsive, oriented to name/date/[facility] hospital, follows simple and multistep commands  Cranial Nerves:  PERRL,  Decreased blink to threat on L side and suspect hemianopia on left, EOMI, no ptosis, left facial droop, decreased hearing bilateral(chronic), cough present.  Motor:  Moves all extremities spontaneously and against light resistance R>>L Increased tone on the left  Sensory:  light touch intact throughout,  bilaterally  Deep Tendon Reflexes: 2+ in the upper extremities and absent in the lower extremities  Plantars:  Right: mute Left: mute  Cerebellar:  FTN slow but intact bilat UE   ASSESSMENT [name] is a 79 y.o. female presenting with slurred speech and left sided weakness. Status post IV t-PA on 8/30 at . Head CT shows evolving R MCA ischemic infarct. Based on location/size suspect may be embolic etiology. Anti-platelets held initially 2/2 tPA administration. Was taking ASA 162mg  daily prior to admission. Repeat CT head stable. Hospital stay complicated by GTC seizure requiring intubation  R MCA ischemic infarct: suspect embolic etiology  GTC seizure: suspect provoked 2/2 infarct. Low suspicion due to infectious or other etiology  Diabetes mellitus, hgb a1c 6.9  Hyperlipidemia, on statin prior to admission, will continue  Atrial fibrillation history.  Peripheral vascular disease  Acute on chronic Diastolic heart failure  PPM  Hospital day # 3  TREATMENT/PLAN  Currently on Plavix 75mg  daily. Repeat head CT stable and patient >24hrs since tPA administration. Had stroke on ASA 162mg  daily, therefore aspirin failure. Dr. Eldridge Johnson has followed patient. Will ask for his input regarding NOAC agents with this patient with afib history.  For seizures, will start Keppra 500mg  BID, question if needed long term, felt secondary due to acute stroke.  CIR  Risk factor modification  [name]. [name], [name], MBA, MHA  [name] Stroke Johnson Pager: [phone] 10/29/2012 8:12 AM This patient is critically ill and at significant risk of neurological worsening, death and care requires constant monitoring of vital signs, hemodynamics,respiratory and cardiac monitoring,review of multiple databases, neurological assessment, discussion with family, other specialists and medical decision making of high complexity. I spent 30 minutes of neurocritical care time  in the care of  this  patient. I have personally obtained a history, examined the patient, evaluated imaging results, and formulated the assessment and plan of care. I agree with the above.  Delia Heady, MD

## 2012-10-29 NOTE — Progress Notes (Signed)
Physical Therapy Treatment Patient Details Name: [name] MRN: [id number] DOB: March 07, 1918 Today's Date: 10/29/2012 Time: 4098-1191 PT Time Calculation (min): 44 min  PT Assessment / Plan / Recommendation  History of Present Illness Patient is a 79 yo female admitted with Lt sided weakness and slurred speech.  Patient with Rt. CVA and received tPA. Developed witnessed seizure, became hypoxic and required intubation.  Was extubated 10/27/12.  Patient with h/o pacemaker, A-fib, HTN.   PT Comments   Pt making some progress today, though still feel SNF safest D/C plan for pt.  Daughter and granddaughter present and wanting pt to go home, however unsure family could provide enough A.    Follow Up Recommendations  SNF     Does the patient have the potential to tolerate intense rehabilitation     Barriers to Discharge        Equipment Recommendations  None recommended by PT    Recommendations for Other Services    Frequency Min 4X/week   Progress towards PT Goals Progress towards PT goals: Progressing toward goals  Plan Current plan remains appropriate    Precautions / Restrictions Precautions Precautions: Fall Precaution Comments: Lt weakness and neglect Restrictions Weight Bearing Restrictions: No   Pertinent Vitals/Pain bil knee arthritis pain.      Mobility  Bed Mobility Bed Mobility: Supine to Sit;Sitting - Scoot to Edge of Bed Supine to Sit: 4: Min assist Sitting - Scoot to Edge of Bed: 5: Supervision Details for Bed Mobility Assistance: cues for sequencing, encouragement.   Transfers Transfers: Sit to Stand;Stand to Sit Sit to Stand: 3: Mod assist;With upper extremity assist;From bed Stand to Sit: 3: Mod assist;With upper extremity assist;To chair/3-in-1 Details for Transfer Assistance: cues for use of UEs, getting closer to recliner prior to sitting, control descent.   Ambulation/Gait Ambulation/Gait Assistance: 1: +2 Total assist Ambulation/Gait: Patient  Percentage: 70% Ambulation Distance (Feet): 6 Feet Assistive device: Rolling walker Ambulation/Gait Assistance Details: pt remains flexed and leans on RW.  Per pt and daughter she has always done this and will not change.  pt c/o Bil knee pain from arthritis and fatigues quickly.  pt with L neglect running into objects and wall on L side.   Gait Pattern: Step-through pattern;Decreased stride length;Trunk flexed;Shuffle Stairs: No Naval architect Mobility: No Modified Rankin (Stroke Patients Only) Pre-Morbid Rankin Score: Slight disability Modified Rankin: Moderately severe disability    Exercises     PT Diagnosis:    PT Problem List:   PT Treatment Interventions:     PT Goals (current goals can now be found in the care plan section) Acute Rehab PT Goals Patient Stated Goal: To be able to go home Time For Goal Achievement: 11/11/12 Potential to Achieve Goals: Good  Visit Information  Last PT Received On: 10/29/12 Assistance Needed: +2 PT/OT Co-Evaluation/Treatment: Yes History of Present Illness: Patient is a 79 yo female admitted with Lt sided weakness and slurred speech.  Patient with Rt. CVA and received tPA. Developed witnessed seizure, became hypoxic and required intubation.  Was extubated 10/27/12.  Patient with h/o pacemaker, A-fib, HTN.    Subjective Data  Patient Stated Goal: To be able to go home   Cognition  Cognition Arousal/Alertness: Awake/alert Behavior During Therapy: [facility] for tasks assessed/performed Overall Cognitive Status: Impaired/Different from baseline Area of Impairment: Attention;Awareness;Orientation;Safety/judgement;Problem solving Orientation Level: Disoriented to;Situation Current Attention Level: Sustained Safety/Judgement: Decreased awareness of safety;Decreased awareness of deficits Awareness: Emergent Problem Solving: Slow processing;Requires verbal cues    Balance  Balance Balance Assessed: Yes Static Standing  Balance Static Standing - Balance Support: Bilateral upper extremity supported Static Standing - Level of Assistance: 4: Min assist  End of Session PT - End of Session Equipment Utilized During Treatment: Gait belt Activity Tolerance: Patient limited by fatigue;Patient limited by pain Patient left: in chair;with call bell/phone within reach;with family/visitor present Nurse Communication: Mobility status   GP     Sunny Schlein, [place] [phone] 10/29/2012, 10:37 AM

## 2012-10-29 NOTE — Progress Notes (Signed)
Occupational Therapy Treatment Patient Details Name: [name] MRN: [id number] DOB: [birth year] Today's Date: 10/29/2012 Time: 4098-1191 OT Time Calculation (min): 41 min  OT Assessment / Plan / Recommendation  History of present illness Patient is a 79 yo female admitted with Lt sided weakness and slurred speech.  Patient with Rt. CVA and received tPA. Developed witnessed seizure, became hypoxic and required intubation.  Was extubated 10/27/12.  Patient with h/o pacemaker, A-fib, HTN.   OT comments  Pt continues to exhibit significant L neglect during adls which is a significant safety risk.  Pt's arthritis in her knees limits how much she can tolerate up on her feet.    Follow Up Recommendations  Supervision/Assistance - 24 hour;Other (comment);CIR (pt will need 24 hour S for a long time after d/c from [facility])    Barriers to Discharge       Equipment Recommendations  3 in 1 bedside comode    Recommendations for Other Services Rehab consult  Frequency Min 3X/week   Progress towards OT Goals Progress towards OT goals: Progressing toward goals  Plan Discharge plan remains appropriate    Precautions / Restrictions Precautions Precautions: Fall Precaution Comments: Lt weakness and neglect Restrictions Weight Bearing Restrictions: No   Pertinent Vitals/Pain Pt stated she had no pain.    ADL  Eating/Feeding: Supervision/safety;Set up Where Assessed - Eating/Feeding: Chair Grooming: Performed;Minimal assistance;Wash/dry face Where Assessed - Grooming: Supported standing Lower Body Dressing: Performed;Maximal assistance Where Assessed - Lower Body Dressing: Supported sit to Pharmacist, hospital: Performed;Moderate assistance Toilet Transfer Method: Sit to stand;Stand pivot Toilet Transfer Equipment: Other (comment) Toileting - Clothing Manipulation and Hygiene: Simulated;Maximal assistance Where Assessed - Toileting Clothing Manipulation and Hygiene: Standing Equipment Used:  Gait belt;Rolling walker Transfers/Ambulation Related to ADLs: Pt walked to sink but ran into wall on the Left and into the computer on the L despite cues.  Pt's daughter stating that pt has more room at home and that is why she is running into objects.  Feel this pt has a significant L neglect and that is why she is running into items on the L only. ADL Comments: pt requiring max to total assist at time with all adls.    OT Diagnosis:    OT Problem List:   OT Treatment Interventions:     OT Goals(current goals can now be found in the care plan section) Acute Rehab OT Goals Patient Stated Goal: To be able to go home OT Goal Formulation: With patient Time For Goal Achievement: 11/11/12 Potential to Achieve Goals: Good ADL Goals Pt Will Perform Upper Body Bathing: with supervision;sitting Pt Will Perform Lower Body Bathing: with min guard assist;sit to/from stand Pt Will Perform Upper Body Dressing: with set-up;with supervision;sitting Pt Will Perform Lower Body Dressing: with set-up;with supervision;with adaptive equipment;sit to/from stand Pt Will Transfer to Toilet: with supervision;ambulating;bedside commode Pt Will Perform Toileting - Clothing Manipulation and hygiene: with supervision;sit to/from stand Additional ADL Goal #1: negotiate environment during ADL @ RW level @ S level  Visit Information  Last OT Received On: 10/29/12 Assistance Needed: +2 PT/OT Co-Evaluation/Treatment: Yes History of Present Illness: Patient is a 79 yo female admitted with Lt sided weakness and slurred speech.  Patient with Rt. CVA and received tPA. Developed witnessed seizure, became hypoxic and required intubation.  Was extubated 10/27/12.  Patient with h/o pacemaker, A-fib, HTN.    Subjective Data      Prior Functioning       Cognition  Cognition Arousal/Alertness: Awake/alert Behavior During  Therapy: WFL for tasks assessed/performed Overall Cognitive Status: Impaired/Different from  baseline Area of Impairment: Attention;Awareness;Orientation;Safety/judgement;Problem solving Orientation Level: Disoriented to;Situation Current Attention Level: Sustained Memory: Decreased recall of precautions Safety/Judgement: Decreased awareness of safety;Decreased awareness of deficits Awareness: Emergent Problem Solving: Slow processing;Requires verbal cues General Comments: Pt's family having a hard time seeing deficits in pt, therefore pt also seems to have some significant insight issues.    Mobility  Bed Mobility Bed Mobility: Supine to Sit;Sitting - Scoot to Edge of Bed Supine to Sit: 4: Min assist Sitting - Scoot to Edge of Bed: 5: Supervision Details for Bed Mobility Assistance: cues for sequencing, encouragement.   Transfers Transfers: Sit to Stand;Stand to Sit Sit to Stand: 3: Mod assist;With upper extremity assist;From bed Stand to Sit: 3: Mod assist;With upper extremity assist;To chair/3-in-1 Details for Transfer Assistance: cues for use of UEs, getting closer to recliner prior to sitting, control descent.      Exercises      Balance Balance Balance Assessed: Yes Static Standing Balance Static Standing - Balance Support: Bilateral upper extremity supported Static Standing - Level of Assistance: 4: Min assist   End of Session OT - End of Session Equipment Utilized During Treatment: Gait belt;Rolling walker Activity Tolerance: Patient tolerated treatment well Patient left: in chair;with call bell/phone within reach;with family/visitor present Nurse Communication: Mobility status  GO     Hope Budds 10/29/2012, 11:37 AM [phone]

## 2012-10-29 NOTE — Progress Notes (Signed)
Speech Language Pathology Dysphagia Treatment Patient Details Name: [name] MRN: [id number] DOB: June 09, 1918 Today's Date: 10/29/2012 Time: 1400-1410 SLP Time Calculation (min): 10 min  Assessment / Plan / Recommendation Clinical Impression  Pt observed with minimal trials of thin liquids, pt refused further attempts. No throat clearing observed today. Dtr and [name] confirm that pt with less throat clearing during meals. Likely pt recovering from intubation well. Educated family regarding s/s of aspiration. No f/u needed for swallowing. Recommend f/u for cognition, see eval note.     Diet Recommendation  Continue with Current Diet: Regular;Thin liquid    SLP Plan Continue with current plan of care   Pertinent Vitals/Pain NA   Swallowing Goals  SLP Swallowing Goals Patient will utilize recommended strategies during swallow to increase swallowing safety with: Supervision/safety Swallow Study Goal #2 - Progress: Progressing toward goal  General Temperature Spikes Noted: No Respiratory Status: Room air Behavior/Cognition: Alert;Cooperative;Pleasant mood;Hard of hearing Oral Cavity - Dentition: Adequate natural dentition Patient Positioning: Upright in chair  Oral Cavity - Oral Hygiene     Dysphagia Treatment Treatment focused on: Skilled observation of diet tolerance;Patient/family/caregiver education Family/Caregiver Educated: daughter Treatment Methods/Modalities: Skilled observation Patient observed directly with PO's: Yes Type of PO's observed: Thin liquids Feeding: Able to feed self Liquids provided via: Cup Type of cueing: Verbal Amount of cueing: Minimal   GO    Harlon Ditty, MA CCC-SLP [phone]  Claudine Mouton 10/29/2012, 2:38 PM

## 2012-10-29 NOTE — Progress Notes (Signed)
OT Evaluation  Pt with significant deficits with visual perception, L inattention and apparent  L field cut, in addition to cognitive deficits. It family is able to provide 24/7 assistance, then CIR would be an option, otherwise, pt will need SNF. Began D/C discussions with family.   10/28/12 1700  OT Visit Information  Last OT Received On 10/28/12  Assistance Needed +2  History of Present Illness Patient is a 79 yo female admitted with Lt sided weakness and slurred speech.  Patient with Rt. CVA and received tPA. Developed witnessed seizure, became hypoxic and required intubation.  Was extubated 10/27/12.  Patient with h/o pacemaker, A-fib, HTN.  Precautions  Precautions Fall  Precaution Comments Lt weakness and neglect  Restrictions  Weight Bearing Restrictions No  Home Living  Family/patient expects to be discharged to: Skilled nursing facility  Living Arrangements Alone  Available Help at Discharge Family;Available PRN/intermittently (Son works and daughter in another state.)  Type of Home House  Home Access Stairs to enter  Entrance Stairs-Number of Steps 1  Entrance Stairs-Rails None  Home Layout One level  Home Equipment Walker - 4 wheels;Tub bench  Additional Comments Son takes patient for errands (groceries, hair, church)  Prior Function  Level of Independence Independent with assistive device(s);Needs assistance  Gait / Transfers Assistance Needed uses rollator in home, limited outside amb - someones accompanies pt  ADL's / Homemaking Assistance Needed son does grocery shopping, pt able to do light meals  Communication / Swallowing Assistance Needed no  Communication  Communication HOH (wears hearing aid)  Cognition  Arousal/Alertness Awake/alert  Behavior During Therapy WFL for tasks assessed/performed  Overall Cognitive Status Impaired/Different from baseline  Area of Impairment Attention;Awareness;Orientation;Safety/judgement;Problem solving  Orientation Level  Disoriented to (Able to state she had a stroke. Otherwise unaware)  Current Attention Level Sustained (Needed redirection - lethargic)  Memory Decreased recall of precautions  Safety/Judgement Decreased awareness of safety;Decreased awareness of deficits  Awareness Emergent  Problem Solving Slow processing;Requires verbal cues  General Comments will further assess  Upper Extremity Assessment  Upper Extremity Assessment Generalized weakness  LUE Deficits / Details Strength slightly decreased to 3/5.  Exhibiting Lt neglect.  LUE Sensation decreased light touch;decreased proprioception  LUE Coordination decreased gross motor  Lower Extremity Assessment  LLE Deficits / Details Strength grossly 3+/5  LLE Sensation decreased light touch  LLE Coordination decreased gross motor  Cervical / Trunk Assessment  Cervical / Trunk Assessment Normal  ADL  Eating/Feeding Supervision/safety;Set up  Where Assessed - Eating/Feeding Chair  Grooming Minimal assistance  Where Assessed - Grooming Supported sitting  Upper Body Bathing Moderate assistance  Where Assessed - Upper Body Bathing Unsupported sitting  Lower Body Bathing Maximal assistance  Where Assessed - Lower Body Bathing Supported sit to stand  Upper Body Dressing Moderate assistance  Where Assessed - Upper Body Dressing Supported sitting  Lower Body Dressing Maximal assistance  Where Assessed - Lower Body Dressing Supported sit to stand  Toilet Transfer Moderate assistance  Toilet Transfer Method Sit to stand;Stand pivot  Toilet Transfer Equipment Other (comment) (simulated bed - chair)  Equipment Used Gait belt  Transfers/Ambulation Related to ADLs difficulty sit - stand due to OA B knees. Good motor control BLE. midline posture  ADL Comments significant decline in function  Vision - History  Baseline Vision Wears glasses only for reading (L eye blind. )  Visual History Cataracts;Corrective eye surgery;Other (comment) (cataract  removed R eye)  Patient Visual Report Peripheral vision impairment  Vision - Assessment  Eye Alignment [facility]  Vision Assessment Vision tested  Ocular Range of Motion [facility]  Alignment/Gaze Preference WDL  Tracking/Visual Pursuits Decreased smoothness of horizontal tracking;Decreased smoothness of vertical tracking  Saccades Overshoots;Additional head turns occurred during testing  Visual Fields Left homonymous hemianopsia;Impaired - to be further tested in functional context  Perception  Perception Impaired  Inattention/Neglect Impaired-to be further tested in functional context (decreased spontaneous use LUE)  Spatial Orientation visual spatial dysfunction  Praxis  Praxis Intact  Transfers  Transfers Sit to Stand;Stand to Sit  Sit to Stand With upper extremity assist;From chair/3-in-1;3: Mod assist  Stand to Sit 3: Mod assist;With upper extremity assist;To bed;With armrests;To chair/3-in-1  Details for Transfer Assistance able to maintain control LLE  Balance  Balance Assessed Yes  Static Sitting Balance  Static Sitting - Balance Support Right upper extremity supported;Feet supported  Static Sitting - Level of Assistance 5: Stand by assistance  Static Standing Balance  Static Standing - Balance Support Bilateral upper extremity supported  Static Standing - Level of Assistance 4: Min assist  Static Standing - Comment/# of Minutes 3  OT - End of Session  Equipment Utilized During Treatment Gait belt  Activity Tolerance Patient tolerated treatment well  Patient left in chair;with call bell/phone within reach;with family/visitor present  Nurse Communication Mobility status  OT Assessment  OT Recommendation/Assessment Patient needs continued OT Services  OT Problem List Decreased strength;Decreased activity tolerance;Impaired vision/perception;Decreased cognition;Decreased safety awareness;Decreased knowledge of use of DME or AE;Cardiopulmonary status limiting activity;Obesity;Impaired  UE functional use;Impaired sensation;Impaired balance (sitting and/or standing)  OT Therapy Diagnosis  Generalized weakness;Disturbance of vision;Cognitive deficits;Blindness and low vision  OT Plan  OT Frequency Min 3X/week  OT Treatment/Interventions Self-care/ADL training;Therapeutic exercise;Neuromuscular education;DME and/or AE instruction;Therapeutic activities;Cognitive remediation/compensation;Visual/perceptual remediation/compensation;Patient/family education;Balance training  OT Recommendation  Recommendations for Other Services Rehab consult  Follow Up Recommendations CIR  OT Equipment 3 in 1 bedside comode  Individuals Consulted  Consulted and Agree with Results and Recommendations Patient;Family member/caregiver  Family Member Consulted daughter  Acute Rehab OT Goals  Patient Stated Goal To be able to go home  OT Goal Formulation With patient  Time For Goal Achievement 11/11/12  Potential to Achieve Goals Good  OT Time Calculation  OT Start Time 1721  OT Stop Time 1805  OT Time Calculation (min) 44 min  OT General Charges  $OT Visit 1 Procedure  OT Evaluation  $Initial OT Evaluation Tier I 1 Procedure  OT Treatments  $Self Care/Home Management  8-22 mins  $Therapeutic Activity 23-37 mins  Written Expression  Dominant Hand Right  Luisa Dago, OTR/L  [phone] 10/29/2012

## 2012-10-29 NOTE — Consult Note (Signed)
Physical Medicine and Rehabilitation Consult  Reason for Consult: Left facial weakness, slurred speech, left sided weakness.  Referring Physician:  Dr. Pearlean Brownie   HPI: [name] is a 79 y.o. female with history of DM, HTN, A Fib; who was admitted on 10/26/12 with left sided weakness and slurred speech. CT with question of early ischemic changes in right insula and patient treated with TPA. Patient with seizure episode with hypoxia later that evening requiring intubation. Started on Keppra. Follow up CT head revealed evolving right MCA infarcts 4.6 X 3.8 cm. 2 D echo with EF 55-65% and no wall abnormality, moderate to severe AVR and MVR. Carotid dopplers difficult to visualize due to movement.  Neurology recommends plavix for CVA prophylaxis--but to discuss NOAC with Dr. Eldridge Dace due to patient's history of A Fib.  . Patient extubated yesterday and ST/PT evaluations done. She was started on regular diet and working on pre gait activities. MD recommending CIR.   Review of Systems  HENT: Positive for hearing loss.   Eyes: Positive for blurred vision (left retinal occlusion? --loss of central vision PTA. ).  Respiratory: Negative for cough and shortness of breath.   Cardiovascular: Negative for chest pain and palpitations.  Gastrointestinal: Negative for heartburn, nausea and abdominal pain.  Musculoskeletal: Positive for myalgias and joint pain (bilateral knee oa).  Neurological: Negative for headaches.   Past Medical History  Diagnosis Date  . Diabetes mellitus   . Hypertension   . Hyperlipidemia   . Arthritis   . AF (atrial fibrillation)   . Cataract     decreased vision in left eye  . Peripheral vascular disease   . CHF (congestive heart failure)    Past Surgical History  Procedure Laterality Date  . Pacemaker insertion  2008  . Cholecystectomy    . Thyroid surgery     No family history on file.  Social History:  Widowed. Lives alone. Uses RW. Has assistance with house work  once a month. Sedentary--goes out with son twice a week. Son in process of moving to [place] with plans to move in mother. She reports that she has never smoked. She has never used smokeless tobacco. She reports that she does not drink alcohol or use illicit drugs.   Allergies  Allergen Reactions  . Nitrofurantoin Nausea And Vomiting  . Sulfa Antibiotics Hives   Medications Prior to Admission  Medication Sig Dispense Refill  . amiodarone (PACERONE) 400 MG tablet Take 400 mg by mouth daily.      Marland Kitchen aspirin 81 MG tablet Take 160 mg by mouth daily.      . beta carotene w/minerals (OCUVITE) tablet Take 1 tablet by mouth daily.      . bumetanide (BUMEX) 1 MG tablet Take 1 mg by mouth daily.      . cyanocobalamin 500 MCG tablet Take 500 mcg by mouth every morning.      . furosemide (LASIX) 20 MG tablet Take 1 tablet (20 mg total) by mouth daily.  30 tablet  0  . lisinopril (PRINIVIL,ZESTRIL) 10 MG tablet Take 1 tablet (10 mg total) by mouth daily.  30 tablet  0  . metFORMIN (GLUCOPHAGE-XR) 500 MG 24 hr tablet Take 500 mg by mouth 3 (three) times daily.       . metoprolol tartrate (LOPRESSOR) 25 MG tablet Take 25 mg by mouth 2 (two) times daily.      . ondansetron (ZOFRAN) 4 MG tablet Take 4 mg by mouth every 8 (eight) hours as needed  for nausea.      . simvastatin (ZOCOR) 40 MG tablet Take 40 mg by mouth every evening.        Home: Home Living Family/patient expects to be discharged to:: Skilled nursing facility Living Arrangements: Alone Available Help at Discharge: Family;Available PRN/intermittently (Son works and daughter in another state.) Type of Home: House Home Access: Stairs to enter Secretary/administrator of Steps: 1 Entrance Stairs-Rails: None Home Layout: One level Home Equipment: Environmental consultant - 4 wheels;Tub bench Additional Comments: Son takes patient for errands (groceries, hair, church)  Functional History:   Functional Status:  Mobility: Bed Mobility Bed Mobility: Rolling  Left;Left Sidelying to Sit;Sitting - Scoot to Edge of Bed Rolling Left: 4: Min assist;With rail Left Sidelying to Sit: 3: Mod assist;With rails;HOB flat Sitting - Scoot to Edge of Bed: 4: Min guard Transfers Transfers: Sit to Stand;Stand to [employer] Transfers Sit to Stand: With upper extremity assist;From chair/3-in-1;3: Mod assist Stand to Sit: 3: Mod assist;With upper extremity assist;To bed;With armrests;To chair/3-in-1 Stand Pivot Transfers: 2: Max assist Ambulation/Gait Ambulation/Gait Assistance: Not tested (comment)    ADL: ADL Eating/Feeding: Supervision/safety;Set up Where Assessed - Eating/Feeding: Chair Grooming: Minimal assistance Where Assessed - Grooming: Supported sitting Upper Body Bathing: Moderate assistance Where Assessed - Upper Body Bathing: Unsupported sitting Lower Body Bathing: Maximal assistance Where Assessed - Lower Body Bathing: Supported sit to stand Upper Body Dressing: Moderate assistance Where Assessed - Upper Body Dressing: Supported sitting Lower Body Dressing: Maximal assistance Where Assessed - Lower Body Dressing: Supported sit to Pharmacist, hospital: Moderate assistance Toilet Transfer Method: Sit to stand;Stand pivot Toilet Transfer Equipment: Other (comment) (simulated bed - chair) Equipment Used: Gait belt Transfers/Ambulation Related to ADLs: difficulty sit - stand due to OA B knees. Good motor control BLE. midline posture ADL Comments: significant decline in function  Cognition: Cognition Overall Cognitive Status: Impaired/Different from baseline Orientation Level: Oriented X4 Cognition Arousal/Alertness: Awake/alert Behavior During Therapy: WFL for tasks assessed/performed Overall Cognitive Status: Impaired/Different from baseline Area of Impairment: Attention;Awareness;Orientation;Safety/judgement;Problem solving Orientation Level: Disoriented to (Able to state she had a stroke. Otherwise unaware) Current Attention  Level: Sustained (Needed redirection - lethargic) Memory: Decreased recall of precautions Safety/Judgement: Decreased awareness of safety;Decreased awareness of deficits Awareness: Emergent Problem Solving: Slow processing;Requires verbal cues General Comments: will further assess  Blood pressure 124/59, pulse 106, temperature 97.8 F (36.6 C), temperature source Oral, resp. rate 21, height 5\' 2"  (1.575 m), weight 89 kg (196 lb 3.4 oz), SpO2 96.00%.   Physical Exam  Nursing note and vitals reviewed. Constitutional: She is oriented to person, place, and time. She appears well-developed and well-nourished.  HENT:  Head: Normocephalic and atraumatic.  Eyes: Conjunctivae are normal. Pupils are equal, round, and reactive to light.  Neck: Normal range of motion. Neck supple.  Cardiovascular: Normal rate and regular rhythm.   No murmur heard. Pulmonary/Chest: Effort normal and breath sounds normal. She has no wheezes.  Abdominal: Soft. Bowel sounds are normal. She exhibits no distension. There is no tenderness.  Musculoskeletal: She exhibits no edema and no tenderness.  Neurological: She is alert and oriented to person, place, and time.  Speech clear. Follows basic commands without difficulty. Left sided weakness, grossly 3-4 LUE and 2-3 LLE, effort inconsistent however. Has grossly intact sense to pain and light touch. Cognitively fairly intact. Is HOH.  Skin: Skin is warm and dry.  Psychiatric: She has a normal mood and affect. Her behavior is normal.    Results for orders placed during the  hospital encounter of 10/26/12 (from the past 24 hour(s))  GLUCOSE, CAPILLARY     Status: Abnormal   Collection Time    10/28/12 11:38 AM      Result Value Range   Glucose-Capillary 119 (*) 70 - 99 mg/dL  GLUCOSE, CAPILLARY     Status: Abnormal   Collection Time    10/28/12  4:02 PM      Result Value Range   Glucose-Capillary 156 (*) 70 - 99 mg/dL   Comment 1 Notify RN     Comment 2 Documented  in Chart    GLUCOSE, CAPILLARY     Status: Abnormal   Collection Time    10/28/12 10:00 PM      Result Value Range   Glucose-Capillary 156 (*) 70 - 99 mg/dL  GLUCOSE, CAPILLARY     Status: Abnormal   Collection Time    10/29/12  4:00 AM      Result Value Range   Glucose-Capillary 104 (*) 70 - 99 mg/dL  GLUCOSE, CAPILLARY     Status: Abnormal   Collection Time    10/29/12  7:49 AM      Result Value Range   Glucose-Capillary 106 (*) 70 - 99 mg/dL   Dg Chest Port 1 View  10/29/2012   *RADIOLOGY REPORT*  Clinical Data: Right pleural effusion.  PORTABLE CHEST - 1 VIEW  Comparison: Plain film chest 10/27/2012 and 10/28/2012.  Findings: The patient's right pleural effusion and associated atelectasis appear improved.  There has been interval worsening of the patient's left pleural effusion and atelectasis.  Cardiomegaly is noted.  No pneumothorax.  IMPRESSION: Decreased right and increased left pleural effusions and atelectasis.   Original Report Authenticated By: Holley Dexter, M.D.   Dg Chest Port 1 View  10/28/2012   *RADIOLOGY REPORT*  Clinical Data: Cerebral infarction, respiratory failure and extubation.  PORTABLE CHEST - 1 VIEW  Comparison: 10/27/2012  Findings: Endotracheal and nasogastric tubes been removed. Pacemaker shows stable positioning.  There remain bilateral lower lobe atelectasis/infiltrates with bilateral pleural effusions, right greater than left.  The heart size is stable.  No overt pulmonary edema is seen.  IMPRESSION: Stable bilateral lower lobe consolidation and pleural effusions.   Original Report Authenticated By: Irish Lack, M.D.    Assessment/Plan: Diagnosis: right MCA infarct 1. Does the need for close, 24 hr/day medical supervision in concert with the patient's rehab needs make it unreasonable for this patient to be served in a less intensive setting? Yes 2. Co-Morbidities requiring supervision/potential complications: seizures, dm, afib 3. Due to bladder  management, bowel management, safety, skin/wound care, disease management, medication administration, pain management and patient education, does the patient require 24 hr/day rehab nursing? Yes 4. Does the patient require coordinated care of a physician, rehab nurse, PT (1-2 hrs/day, 5 days/week), OT (1-2 hrs/day, 5 days/week) and SLP (1-2 hrs/day, 5 days/week) to address physical and functional deficits in the context of the above medical diagnosis(es)? Yes Addressing deficits in the following areas: balance, endurance, locomotion, strength, transferring, bowel/bladder control, bathing, dressing, feeding, grooming, toileting, cognition, speech and psychosocial support 5. Can the patient actively participate in an intensive therapy program of at least 3 hrs of therapy per day at least 5 days per week? Potentially 6. The potential for patient to make measurable gains while on inpatient rehab is good 7. Anticipated functional outcomes upon discharge from inpatient rehab are supervision with PT, supervision to min assist  with OT, mod I with SLP. 8. Estimated rehab length of  stay to reach the above functional goals is: 2-3 weeks 9. Does the patient have adequate social supports to accommodate these discharge functional goals? Potentially 10. Anticipated D/C setting: Home 11. Anticipated post D/C treatments: HH therapy 12. Overall Rehab/Functional Prognosis: excellent  RECOMMENDATIONS: This patient's condition is appropriate for continued rehabilitative care in the following setting: CIR Patient has agreed to participate in recommended program. Yes Note that insurance prior authorization may be required for reimbursement for recommended care.  Comment: Discussed situation at length with the patient and her daughter. It appears that the patient has enough social supports to provide for potential 24 hour supervision once home. Rehab RN to follow up.   Ranelle Oyster, MD,  [place] [facility]     10/29/2012

## 2012-10-30 DIAGNOSIS — M7989 Other specified soft tissue disorders: Secondary | ICD-10-CM

## 2012-10-30 LAB — BASIC METABOLIC PANEL
CO2: 29 mEq/L (ref 19–32)
Chloride: 96 mEq/L (ref 96–112)
Creatinine, Ser: 0.76 mg/dL (ref 0.50–1.10)
Sodium: 136 mEq/L (ref 135–145)

## 2012-10-30 LAB — GLUCOSE, CAPILLARY
Glucose-Capillary: 126 mg/dL — ABNORMAL HIGH (ref 70–99)
Glucose-Capillary: 146 mg/dL — ABNORMAL HIGH (ref 70–99)
Glucose-Capillary: 129 mg/dL — ABNORMAL HIGH (ref 70–99)
Glucose-Capillary: 127 mg/dL — ABNORMAL HIGH (ref 70–99)

## 2012-10-30 LAB — PHOSPHORUS: Phosphorus: 2.2 mg/dL — ABNORMAL LOW (ref 2.3–4.6)

## 2012-10-30 MED ORDER — POTASSIUM CHLORIDE CRYS ER 20 MEQ PO TBCR
60.0000 meq | EXTENDED_RELEASE_TABLET | Freq: Four times a day (QID) | ORAL | Status: AC
  Administered 2012-10-30 (×2): 60 meq via ORAL
  Filled 2012-10-30: qty 3

## 2012-10-30 MED ORDER — LEVETIRACETAM 500 MG PO TABS
500.0000 mg | ORAL_TABLET | Freq: Two times a day (BID) | ORAL | Status: DC
  Administered 2012-10-30 – 2012-10-31 (×3): 500 mg via ORAL
  Filled 2012-10-30 (×4): qty 1

## 2012-10-30 NOTE — Progress Notes (Signed)
Preauthorization number for Eliquis obtained after calling insurance company: # [place] [id number].

## 2012-10-30 NOTE — Care Management (Signed)
MD- copay for Eliquis is $45/30 day supply - prior Berkley Harvey is required phone number to call is [phone]. Will provide pt with 30 day free card. Pt will need Rx for 30 day free no refills. Thanks Gala Lewandowsky, RN,BSN [phone]

## 2012-10-30 NOTE — Progress Notes (Signed)
[facility]  Name: [name] MRN: [id number] DOB: 1918-09-17    ADMISSION DATE:  10/26/2012 CONSULTATION DATE:  10/26/2012  REFERRING MD :  Thad Ranger   PRIMARY SERVICE: Stroke  CHIEF COMPLAINT:  Respiratory failure   BRIEF PATIENT DESCRIPTION:  79 yo admitted to Neuro ICU on 8/30 with acute right hemisphic CVA, s/p TPA with improved left sided weakness. Developed witnessed seizure, became hypoxic and required intubation.  SIGNIFICANT EVENTS / STUDIES:  8/30  Head CT >>> nad 8/30  Seizure/ respiratory failure, intubated 8/30  Head Ct >>>Developing posterior right MCA division infarct. No hemorrhage 8/30  TTE >>> 8/30  Carotid Doppler >>>Right: mild calcific plaque origin ICA. Left: moderate softplaque origin ICA. Unable to adequately visualize ICAs bilaterally. Right vertebral artery flow is antegrade. 8/31 Head CT: Developing posterior right MCA division infarct. No hemorrhage  LINES / TUBES: OETT 8/30 >>>8/31 OGT 8/30 >>>8/31 Foley 8/30 >>>  CULTURES:  ANTIBIOTICS:  SUBJECTIVE:  Patient is doing very well, daughter at bedside asking about admission to CIR. Feels very weak  VITAL SIGNS: Temp:  [97.3 F (36.3 C)-98.3 F (36.8 C)] 98.3 F (36.8 C) (09/03 1550) Pulse Rate:  [72-99] 99 (09/03 1550) Resp:  [18] 18 (09/03 1550) BP: (122-152)/(46-86) 131/86 mmHg (09/03 1550) SpO2:  [93 %-100 %] 95 % (09/03 1550)  HEMODYNAMICS:   VENTILATOR SETTINGS:    INTAKE / OUTPUT: Intake/Output     09/02 0701 - 09/03 0700 09/03 0701 - 09/04 0700   P.O. 180 360   I.V. (mL/kg)     IV Piggyback 105    Total Intake(mL/kg) 285 (3.2) 360 (4)   Urine (mL/kg/hr) 1125 (0.5) 1400 (1.4)   Total Output 1125 1400   Net -840 -1040         PHYSICAL EXAMINATION: General: awake, very hard of hearing Neuro:  HOH, moves ext, left weaker very mild only fine motor - this is much improved, oriented X 4 HEENT:  [place], no JVD  Cardiovascular:  Regular irregular, no MRG Lungs:   Clear, decreased right base  Abdomen:+ bowel sounds no OM  Musculoskeletal:  Intact  Skin:  LE edema    PULMONARY  Recent Labs Lab 10/26/12 0658 10/26/12 1721 10/27/12 0427  PHART  --  7.541* 7.509*  PCO2ART  --  36.8 31.3*  PO2ART  --  377.0* 91.4  HCO3  --  31.7* 24.7*  TCO2 30 33 25.6  O2SAT  --  100.0 98.4    CBC  Recent Labs Lab 10/27/12 0550 10/28/12 0624 10/29/12 0945  HGB 12.9 13.4 14.6  HCT 38.3 39.5 42.5  WBC 14.9* 11.9* 9.8  PLT 254 185 260    COAGULATION  Recent Labs Lab 10/26/12 0650  INR 1.07    CARDIAC    Recent Labs Lab 10/26/12 0650  TROPONINI <0.30    Recent Labs Lab 10/29/12 0933 10/30/12 0611  PROBNP 4185.0* 5109.0*     CHEMISTRY  Recent Labs Lab 10/27/12 0550 10/27/12 1329 10/28/12 0624 10/29/12 0945 10/30/12 0611  NA 137 135 136 134* 136  K 3.4* 3.9 3.8 3.4* 2.9*  CL 96 94* 96 94* 96  CO2 25 24 26 26 29   GLUCOSE 118* 168* 112* 146* 141*  BUN 13 12 12 13 12   CREATININE 0.92 0.79 0.72 0.78 0.76  CALCIUM 7.5* 7.1* 7.2* 7.8* 7.4*  MG  --   --   --   --  1.1*  PHOS  --   --   --   --  2.2*   Estimated Creatinine Clearance: 47.5 ml/min (by C-G formula based on Cr of 0.76).   LIVER  Recent Labs Lab 10/26/12 0650 10/27/12 0550 10/28/12 0624  AST 21 18 17   ALT 11 10 9   ALKPHOS 50 46 50  BILITOT 0.7 0.8 1.2  PROT 6.5 5.6* 5.7*  ALBUMIN 3.5 2.9* 2.8*  INR 1.07  --   --      INFECTIOUS No results found for this basename: LATICACIDVEN, PROCALCITON,  in the last 168 hours   ENDOCRINE CBG (last 3)   Recent Labs  10/30/12 0728 10/30/12 1152 10/30/12 1619  GLUCAP 126* 146* 129*         IMAGING x48h  Dg Chest Port 1 View  10/29/2012   *RADIOLOGY REPORT*  Clinical Data: Right pleural effusion.  PORTABLE CHEST - 1 VIEW  Comparison: Plain film chest 10/27/2012 and 10/28/2012.  Findings: The patient's right pleural effusion and associated atelectasis appear improved.  There has been interval  worsening of the patient's left pleural effusion and atelectasis.  Cardiomegaly is noted.  No pneumothorax.  IMPRESSION: Decreased right and increased left pleural effusions and atelectasis.   Original Report Authenticated By: Holley Dexter, M.D.      Imaging Dg Chest Port 1 View  10/29/2012   *RADIOLOGY REPORT*  Clinical Data: Right pleural effusion.  PORTABLE CHEST - 1 VIEW  Comparison: Plain film chest 10/27/2012 and 10/28/2012.  Findings: The patient's right pleural effusion and associated atelectasis appear improved.  There has been interval worsening of the patient's left pleural effusion and atelectasis.  Cardiomegaly is noted.  No pneumothorax.  IMPRESSION: Decreased right and increased left pleural effusions and atelectasis.   Original Report Authenticated By: Holley Dexter, M.D.   CXR: Right sided effusion/consolidation   ASSESSMENT / PLAN:  PULMONARY A:   Acute respiratory failure in setting of seizure (no subsequent seizures after initial intubation).  Extubated 8/31 Bilateral R>L effusions.   10/29/12: doing well post extubation. CXR with bilateral effusin related to diastolic chf  P:   Lasix  Daily; hild for sbo < 100 per neuro Mobilize IS Monitor effusions with periodic cxr  CARDIOVASCULAR A: AF, controlled rate and Gr3 diastolic cHF  P:  Goal SBP > 110 pper neuro.  Resume home meds  tele  RENAL A:  Acute vs chronic renal failure - resolved.   P:   Resume home meds Trend I&O Hypokalemia, replete orally, check potassium in a.m.   GASTROINTESTINAL A:   No acute issues. P:   Protonix for GI Px Advance diet   HEMATOLOGIC A:   Status post tPA.  Leukocytosis, likely reactive. P:  Trend CBC  SCD for DVT Px  INFECTIOUS A:  No acute issues. P:  No intervention required.  ENDOCRINE A:  DM. Hyperglycemia. P:   SSI  NEUROLOGIC A:   Acute right MCA CVA, s/p TPA. Seizure, witnessed 8/30. Encephalopathy.   P:   Keppra Per Neurology Was on  Plavix, switch her neurology and cardiology recommendation to Eliquis. Other services as well or where that patient received tPA on the day of admission.     Clint Lipps Pager: [phone] 10/30/2012, 6:06 PM   10/30/2012 6:05 PM

## 2012-10-30 NOTE — Progress Notes (Addendum)
Clinical Social Work Department CLINICAL SOCIAL WORK PLACEMENT NOTE 10/30/2012  Patient:  SALEAH, RISHEL  Account Number:  [id number] Admit date:  10/26/2012  Clinical Social Worker:  Sharol Harness, Theresia Majors  Date/time:  10/30/2012 01:00 AM  Clinical Social Work is seeking post-discharge placement for this patient at the following level of care:   SKILLED NURSING   (*CSW will update this form in Epic as items are completed)   10/30/2012  Patient/family provided with [facility] Department of Clinical Social Work's list of facilities offering this level of care within the geographic area requested by the patient (or if unable, by the patient's family).  10/30/2012  Patient/family informed of their freedom to choose among providers that offer the needed level of care, that participate in Medicare, Medicaid or managed care program needed by the patient, have an available bed and are willing to accept the patient.  10/30/2012  Patient/family informed of [facility]' ownership interest in [facility], as well as of the fact that they are under no obligation to receive care at this facility.  PASARR submitted to EDS on  PASARR number received from EDS on  Existing  FL2 transmitted to all facilities in geographic area requested by pt/family on  10/30/2012 FL2 transmitted to all facilities within larger geographic area on   Patient informed that his/her managed care company has contracts with or will negotiate with  certain facilities, including the following:     Patient/family informed of bed offers received:  10/31/2012 Patient chooses bed at [facility] Physician recommends and patient chooses bed at    Patient to be transferred to  on   Patient to be transferred to facility by   The following physician request were entered in Epic:   Additional Comments:  Royce Stegman, LCSWA [phone]

## 2012-10-30 NOTE — Progress Notes (Signed)
Clinical Social Work Department BRIEF PSYCHOSOCIAL ASSESSMENT 10/30/2012  Patient:  [facility]     Account Number:  [id number]     Admit date:  10/26/2012  Clinical Social Worker:  Harless Nakayama  Date/Time:  10/30/2012 11:00 AM  Referred by:  Physician  Date Referred:  10/30/2012 Referred for  SNF Placement   Other Referral:   Interview type:  Patient Other interview type:   CSW conducted assessment with pt, pt daughter, and pt granddaughter.    PSYCHOSOCIAL DATA Living Status:  ALONE Admitted from facility:   Level of care:   Primary support name:  Labrina Lines [phone] Primary support relationship to patient:  CHILD, ADULT Degree of support available:   Pt has very good family support    CURRENT CONCERNS Current Concerns  Post-Acute Placement   Other Concerns:    SOCIAL WORK ASSESSMENT / PLAN CSW aware that pt is awaiting insurance approval for CIR. CSW spoke with family about ST rehab at [facility] as backup. Pt and pt family are agreeable for SNF search. CSW explained SNF search process to family and confirmed this would only be a backup for CIR.   Assessment/plan status:  Psychosocial Support/Ongoing Assessment of Needs Other assessment/ plan:   Information/referral to community resources:   SNF list decline at this time. Family requests two copies of list with bed offers marked at a later time.    PATIENT'S/FAMILY'S RESPONSE TO PLAN OF CARE: Pt and pt family are very agreeable for ST rehab at [facility].       Garan Frappier, LCSWA [phone]

## 2012-10-30 NOTE — Clinical Documentation Improvement (Signed)
THIS DOCUMENT IS NOT A PERMANENT PART OF THE MEDICAL RECORD  Please update your documentation with the medical record to reflect your response to this query. If you need help knowing how to do this please call [phone].  10/30/12   Dear Guy Franco PA,   Noted in chart multiple times:"left weakness" which is a nonspecific term. Please clarify in Notes and DC summary. Thank you.  Possible Clinical Conditions? - left hemiparesis - left hemiplegia - other condition (please specify)  Supporting Information: - Acute embolic CVA - left sided weakness with LUE: 2-3/5 and LLE: 2/5 and left neglect   You may use possible, probable, or suspect with inpatient documentation. possible, probable, suspected diagnoses MUST be documented at the time of discharge    Reviewed: additional documentation in the medical record  Thank You,  Beverley Fiedler RN BSN Clinical Documentation Specialist: [phone] Health Information Management [place]

## 2012-10-30 NOTE — Care Management Note (Signed)
    Page 1 of 1   10/30/2012     10:29:44 AM   CARE MANAGEMENT NOTE 10/30/2012  Patient:  [facility]   Account Number:  [id number]  Date Initiated:  10/30/2012  Documentation initiated by:  GRAVES-BIGELOW,Azahel Belcastro  Subjective/Objective Assessment:   Pt admitted for Left sided weakness. Patient with Rt. CVA and received tPA. Developed witnessed seizure, became hypoxic and required intubation.  Was extubated 10/27/12. Patient with h/o pacemaker, A-fib, HTN.     Action/Plan:   Plan is for CIR. Awaiting Insurance authorization at this time. CM will continue to monitor.   Anticipated DC Date:  10/30/2012   Anticipated DC Plan:  IP REHAB FACILITY      DC Planning Services  CM consult      Choice offered to / List presented to:             Status of service:  Completed, signed off Medicare Important Message given?   (If response is "NO", the following Medicare IM given date fields will be blank) Date Medicare IM given:   Date Additional Medicare IM given:    Discharge Disposition:  IP REHAB FACILITY  Per UR Regulation:  Reviewed for med. necessity/level of care/duration of stay  If discussed at Long Length of Stay Meetings, dates discussed:    Comments:

## 2012-10-30 NOTE — Progress Notes (Signed)
SUBJECTIVE:  Feels ok.  Awaiting rehab.  OBJECTIVE:   Vitals:   Filed Vitals:   10/30/12 0832 10/30/12 1026 10/30/12 1206 10/30/12 1550  BP: 130/49 122/53 152/66 131/86  Pulse: 92 99 75 99  Temp: 97.9 F (36.6 C)  98.3 F (36.8 C) 98.3 F (36.8 C)  TempSrc: Oral  Oral Oral  Resp: 18  18 18   Height:      Weight:      SpO2: 93%  96% 95%   I&O's:   Intake/Output Summary (Last 24 hours) at 10/30/12 1611 Last data filed at 10/30/12 1235  Gross per 24 hour  Intake    360 ml  Output   1350 ml  Net   -990 ml   TELEMETRY: Reviewed telemetry pt in Atrial fibrillation:     PHYSICAL EXAM General: Well developed, well nourished, in no acute distress Head: Eyes PERRLA, No xanthomas.   Normal cephalic and atramatic  Lungs:   Clear bilaterally to auscultation and percussion. Heart:  Irregularly irregular S1 S2 Pulses are 2+ & equal.            No JVD.  Abdomen: abdomen soft and non-tender   Extremities:  No edema.  DP +1 Neuro: Alert and oriented X 3. Psych:  Normal affect, responds appropriately   LABS: Basic Metabolic Panel:  Recent Labs  16/10/96 0945 10/30/12 0611  NA 134* 136  K 3.4* 2.9*  CL 94* 96  CO2 26 29  GLUCOSE 146* 141*  BUN 13 12  CREATININE 0.78 0.76  CALCIUM 7.8* 7.4*  MG  --  1.1*  PHOS  --  2.2*   Liver Function Tests:  Recent Labs  10/28/12 0624  AST 17  ALT 9  ALKPHOS 50  BILITOT 1.2  PROT 5.7*  ALBUMIN 2.8*   No results found for this basename: LIPASE, AMYLASE,  in the last 72 hours CBC:  Recent Labs  10/28/12 0624 10/29/12 0945  WBC 11.9* 9.8  HGB 13.4 14.6  HCT 39.5 42.5  MCV 91.9 90.2  PLT 185 260   Cardiac Enzymes: No results found for this basename: CKTOTAL, CKMB, CKMBINDEX, TROPONINI,  in the last 72 hours BNP: No components found with this basename: POCBNP,  D-Dimer: No results found for this basename: DDIMER,  in the last 72 hours Hemoglobin A1C: No results found for this basename: HGBA1C,  in the last 72  hours Fasting Lipid Panel: No results found for this basename: CHOL, HDL, LDLCALC, TRIG, CHOLHDL, LDLDIRECT,  in the last 72 hours Thyroid Function Tests: No results found for this basename: TSH, T4TOTAL, FREET3, T3FREE, THYROIDAB,  in the last 72 hours Anemia Panel: No results found for this basename: VITAMINB12, FOLATE, FERRITIN, TIBC, IRON, RETICCTPCT,  in the last 72 hours Coag Panel:   Lab Results  Component Value Date   INR 1.07 10/26/2012   INR 1.84* 08/19/2009   INR 2.3* 11/09/2007    RADIOLOGY: Dg Chest 2 View  10/12/2012   CLINICAL DATA:  Shortness of Breath. Fatigue.  EXAM: CHEST  2 VIEW  COMPARISON:  03/17/2007  FINDINGS: New bibasilar pulmonary opacity is seen which may be due to atelectasis or infiltrates. Small pleural effusions are also new. Heart size is within normal limits. Dual lead transvenous pacemaker remains in appropriate position.  IMPRESSION: New bibasilar atelectasis versus infiltrates and small pleural effusions.   Electronically Signed   By: Myles Rosenthal   On: 10/12/2012 12:05   Ct Head Wo Contrast  10/27/2012   *  RADIOLOGY REPORT*  Clinical Data: Status post t-PA  CT HEAD WITHOUT CONTRAST  Technique:  Contiguous axial images were obtained from the base of the skull through the vertex without contrast.  Comparison: 10/26/2012  Findings: The bony calvarium is intact.  There are again noted changes consistent with infarcts in the right posterior parietal region.  This area measures approximately 4.6 x 3.8 cm in greatest dimension.  No significant mass effect upon the adjacent ventricle is noted.  Mild chronic white matter ischemic change is again seen. The hyperdensity seen in the branches of the right middle cerebral artery is less well visualized on the current study.  No acute hemorrhages seen.  IMPRESSION: Evolving right middle cerebral artery infarct.  No acute hemorrhage is noted.  Continued follow-up is recommended as indicated.   Original Report Authenticated By:  Alcide Clever, M.D.   Ct Head Wo Contrast  10/26/2012   *RADIOLOGY REPORT*  Clinical Data: Acute right-sided CVA with new onset respiratory failure.  CVA treated with t-PA.Seizure.  CT HEAD WITHOUT CONTRAST  Technique:  Contiguous axial images were obtained from the base of the skull through the vertex without contrast.  Comparison: 10/26/2012.  Findings: Subacute infarction is present in the posterior right MCA territory with decreased attenuation of the gray and white matter. Posterior fossa structures appear within normal limits.  There is no hemorrhage.  Benign basal ganglia calcifications are present. On image number 11, they are this ICU patient in the posterior MCA branch, probably representing thrombosis.  There is no midline shift or hydrocephalus.  The calvarium appears intact.  IMPRESSION: Developing posterior right MCA division infarct.  No hemorrhage or complicating features in this patient with seizure.   Original Report Authenticated By: Andreas Newport, M.D.   Ct Head Wo Contrast  10/26/2012   *RADIOLOGY REPORT*  Clinical Data: Slurred speech and left-sided weakness.  Code stroke.  CT HEAD WITHOUT CONTRAST  Technique:  Contiguous axial images were obtained from the base of the skull through the vertex without contrast.  Comparison: 07/01/2010.  Findings:  Skull:No acute osseous abnormality. No lytic or blastic lesion.  Orbits: Right cataract resection.  Brain: No evidence of acute abnormality, such as acute infarction, hemorrhage, hydrocephalus, or mass lesion/mass effect (questionable loss of posterior right insula gray-white differentiation, not definite given the scan quality).  Similar pattern of patchy bilateral cerebral white matter low attenuation.  Low attenuation in the of cortical left occipital pole was likely present previously, and does not explain provided history.  These results were called by telephone on 10/26/2012 at 07:30 a.m. to Dr. Thad Ranger, who verbally acknowledged these  results.  IMPRESSION: 1. No acute intracranial hemorrhage. 2. Questionable early ischemic changes in the posterior right insula.  3.  Chronic small vessel ischemia.   Original Report Authenticated By: Tiburcio Pea   Dg Chest Port 1 View  10/29/2012   *RADIOLOGY REPORT*  Clinical Data: Right pleural effusion.  PORTABLE CHEST - 1 VIEW  Comparison: Plain film chest 10/27/2012 and 10/28/2012.  Findings: The patient's right pleural effusion and associated atelectasis appear improved.  There has been interval worsening of the patient's left pleural effusion and atelectasis.  Cardiomegaly is noted.  No pneumothorax.  IMPRESSION: Decreased right and increased left pleural effusions and atelectasis.   Original Report Authenticated By: Holley Dexter, M.D.   Dg Chest Port 1 View  10/28/2012   *RADIOLOGY REPORT*  Clinical Data: Cerebral infarction, respiratory failure and extubation.  PORTABLE CHEST - 1 VIEW  Comparison: 10/27/2012  Findings:  Endotracheal and nasogastric tubes been removed. Pacemaker shows stable positioning.  There remain bilateral lower lobe atelectasis/infiltrates with bilateral pleural effusions, right greater than left.  The heart size is stable.  No overt pulmonary edema is seen.  IMPRESSION: Stable bilateral lower lobe consolidation and pleural effusions.   Original Report Authenticated By: Irish Lack, M.D.   Dg Chest Port 1 View  10/27/2012   *RADIOLOGY REPORT*  Clinical Data: Check endotracheal tube  PORTABLE CHEST - 1 VIEW  Comparison: 10/26/2012  Findings: The endotracheal tube and nasogastric catheter are again identified and stable.  A pacing device is again seen.  Increased density is noted over the bases bilaterally worse than that seen on the prior exam.  This is consistent with bilateral pleural effusions.  Likely underlying atelectasis is present.  No definitive pneumothorax is seen.  IMPRESSION: Increasing bilateral pleural effusions.   Original Report Authenticated By: Alcide Clever, M.D.   Dg Chest Port 1 View  10/26/2012   *RADIOLOGY REPORT*  Clinical Data: Endotracheal tube placement.  Evaluate ET tube.  PORTABLE CHEST - 1 VIEW  Comparison: 10/12/2012.  Findings: The endotracheal tube tip is 31 mm from the carina. Interval intubation.  Enteric tube is present with the tip not visualized.  Defibrillator pads overlie the chest.  Left subclavian dual lead pacemaker.  Right costophrenic angle is excluded from view.  There is lucency in the left upper quadrant suspicious for intra-abdominal free air. Development of bilateral effusions, basilar atelectasis and basilar airspace disease.  IMPRESSION: 1.  Interval intubation with endotracheal tube tip 31 mm from the carina. 2.  Enteric tube present with the tip not visualized. 3.  Possible free intra-abdominal air in the left upper quadrant. Critical Value/emergent results were called by telephone at the time of interpretation on 10/26/2012 at 1519 hours to [facility], RN, who verbally acknowledged these results. 4.  Development of pleural effusions and basilar airspace disease likely representing CHF.   Original Report Authenticated By: Andreas Newport, M.D.      ASSESSMENT: AFib, CVA  PLAN:  Rate controlled.  Eliquis for stroke prevention.   Continue Amiodarone load.  WOuld d/c on 400 mg daily along with metoprolol.   Corky Crafts., MD  10/30/2012  4:11 PM

## 2012-10-30 NOTE — Progress Notes (Addendum)
Stroke Team Progress Note  HISTORY Per admission note: is an 79 y.o. female who was at home with her daughter. She awakened to use the bathroom and was able to ambulate and speak to her daughter. When her daughter got up later in the morning the patient had slurred speech. While they were talking the patient developed left sided weakness as well. EMS was called and the patient was brought in as a code stroke. Initial NIHSS of 21. TPA given.   She was admitted to the neuro ICU for further evaluation and treatment.  SUBJECTIVE Family in room. Patient lying in bed ready to go to rehab.  OBJECTIVE Most recent Vital Signs: Filed Vitals:   10/29/12 1600 10/29/12 2151 10/29/12 2351 10/30/12 0459  BP: 122/52 135/50 130/50 134/46  Pulse: 79 88 89 72  Temp:  97.6 F (36.4 C) 97.4 F (36.3 C) 97.3 F (36.3 C)  TempSrc:  Axillary    Resp: 25 18 18 18   Height:      Weight:      SpO2: 94% 96% 100% 98%   CBG (last 3)   Recent Labs  10/29/12 1734 10/29/12 2149 10/30/12 0728  GLUCAP 133* 158* 126*    IV Fluid Intake:   . sodium chloride 20 mL (10/27/12 0922)    MEDICATIONS  . amiodarone  400 mg Oral BID  . apixaban  2.5 mg Oral BID  . atorvastatin  20 mg Oral q1800  . bumetanide  1 mg Oral Daily  . furosemide  40 mg Intravenous Q12H  . insulin aspart  0-15 Units Subcutaneous TID WC & HS  . levETIRAcetam  500 mg Intravenous Q12H  . metoprolol tartrate  25 mg Oral BID   PRN:  acetaminophen, acetaminophen, senna-docusate  Diet:  Regular thin liquids Activity:  Up as tolerated DVT Prophylaxis:  SCD  CLINICALLY SIGNIFICANT STUDIES Basic Metabolic Panel:   Recent Labs Lab 10/29/12 0945 10/30/12 0611  NA 134* 136  K 3.4* 2.9*  CL 94* 96  CO2 26 29  GLUCOSE 146* 141*  BUN 13 12  CREATININE 0.78 0.76  CALCIUM 7.8* 7.4*  MG  --  1.1*  PHOS  --  2.2*   Liver Function Tests:   Recent Labs Lab 10/27/12 0550 10/28/12 0624  AST 18 17  ALT 10 9  ALKPHOS 46 50  BILITOT  0.8 1.2  PROT 5.6* 5.7*  ALBUMIN 2.9* 2.8*   CBC:  Recent Labs Lab 10/26/12 0650  10/28/12 0624 10/29/12 0945  WBC 7.5  < > 11.9* 9.8  NEUTROABS 4.0  --   --   --   HGB 13.3  < > 13.4 14.6  HCT 39.3  < > 39.5 42.5  MCV 90.6  < > 91.9 90.2  PLT 327  < > 185 260  < > = values in this interval not displayed. Coagulation:   Recent Labs Lab 10/26/12 0650  LABPROT 13.7  INR 1.07   Cardiac Enzymes:   Recent Labs Lab 10/26/12 0650  TROPONINI <0.30   Urinalysis:   Recent Labs Lab 10/26/12 0731  COLORURINE YELLOW  LABSPEC 1.006  PHURINE 5.0  GLUCOSEU NEGATIVE  HGBUR NEGATIVE  BILIRUBINUR NEGATIVE  KETONESUR NEGATIVE  PROTEINUR NEGATIVE  UROBILINOGEN 0.2  NITRITE NEGATIVE  LEUKOCYTESUR NEGATIVE   Lipid Panel    Component Value Date/Time   CHOL 124 10/27/2012 0550   TRIG 87 10/27/2012 0550   HDL 75 10/27/2012 0550   CHOLHDL 1.7 10/27/2012 0550   VLDL 17  10/27/2012 0550   LDLCALC 32 10/27/2012 0550   HgbA1C  Lab Results  Component Value Date   HGBA1C 6.9* 10/27/2012    Urine Drug Screen:     Component Value Date/Time   LABOPIA NONE DETECTED 10/26/2012 0731   COCAINSCRNUR NONE DETECTED 10/26/2012 0731   LABBENZ NONE DETECTED 10/26/2012 0731   AMPHETMU NONE DETECTED 10/26/2012 0731   THCU NONE DETECTED 10/26/2012 0731   LABBARB NONE DETECTED 10/26/2012 0731    Alcohol Level:   Recent Labs Lab 10/26/12 0650  ETH <11    Dg Chest Port 1 View 10/29/2012  Decreased right and increased left pleural effusions and atelectasis.     Dg Chest Por 1 View 10/28/2012   : Stable bilateral lower lobe consolidation and pleural effusions.     Physical Exam   Mental Status:  Alert, responsive, oriented to name/date/[facility] hospital, follows simple and multistep commands  Cranial Nerves:  PERRL,  Decreased blink to threat on L side and suspect hemianopia on left, EOMI, no ptosis, left facial droop, decreased hearing bilateral(chronic), cough present.  Motor:  Moves all  extremities spontaneously and against light resistance R>>L Increased tone on the left  Sensory:  light touch intact throughout, bilaterally  Deep Tendon Reflexes: 2+ in the upper extremities and absent in the lower extremities  Plantars:  Right: mute Left: mute  Cerebellar:  FTN slow but intact bilat UE   ASSESSMENT [name] is a 79 y.o. female presenting with slurred speech and left hemiparesis. Status post IV t-PA on 8/30 at . Head CT shows evolving R MCA ischemic infarct. Based on location/size suspect may be embolic etiology. Anti-platelets held initially 2/2 tPA administration. Was taking ASA 162mg  daily prior to admission. Repeat CT head stable. Hospital stay complicated by GTC seizure requiring intubation  R MCA ischemic infarct: suspect embolic etiology  GTC seizure: suspect provoked 2/2 infarct. Low suspicion due to infectious or other etiology  Diabetes mellitus, hgb a1c 6.9  Hyperlipidemia, on statin prior to admission, will continue  Atrial fibrillation history.  Peripheral vascular disease  Acute on chronic Diastolic heart failure  PPM  Hospital day # 4  TREATMENT/PLAN  Had been on Plavix 75mg  daily. Repeat head CT stable and patient >24hrs since tPA administration. Had stroke on ASA 162mg  daily, therefore aspirin failure. Dr. Eldridge Dace has followed patient. She was on coumadin and 2012 stopped after falls. We are all in agreement with eliquis 2.5mg  daily.  For seizures, will start Keppra 500mg  BID, question if needed long term, felt secondary due to acute stroke.  CIR recommended, ready from neurological standpoint.  Risk factor modification  Follow up with Dr. Pearlean Brownie in 2 months  [name]. Manson Passey, [facility], MBA, MHA [facility] Pager: [phone] 10/30/2012 9:51 AM  I have personally obtained a history, examined the patient, evaluated imaging results, and formulated the assessment and plan of care. I agree with the above. Delia Heady,  MD

## 2012-10-30 NOTE — PMR Pre-admission (Signed)
PMR Admission Coordinator Pre-Admission Assessment  Patient: [name] is an 79 y.o., female MRN: [id number] DOB: 1918-08-27 Height: 5\' 2"  (157.5 cm) Weight: 89 kg (196 lb 3.4 oz)              Insurance Information HMO:  Yes    PPO:       PCP:       IPA:       80/20:       OTHER:  Group # [id number]  PRIMARY: AARP Medicare Complete      Policy#: [id number]      Subscriber: [name] CM Name: Bertram Denver      Phone#: [phone]     Fax#:   Pre-Cert#: [id number]     Employer: Retired Benefits:  Phone #:  [phone]     Name: Kennith Gain. Date: 11/27/12     Deduct: $0      Out of Pocket Max: $4900(met$1985.98)      Life Max: unlimited CIR: $295 days 1-5      SNF: $25 days 1-20, $152 days 21-49, $0 days 50+ Outpatient: No visit limit     Co-Pay: $45/visit Home Health: 100%      Co-Pay: none DME: 80%     Co-Pay: 20% Providers: in network  Emergency Contact Information Contact Information   Name Relation Home Work Mobile   Handfgraaf,Darrel Son [phone]       Current Medical History  Patient Admitting Diagnosis: R MCA infarct   History of Present Illness: A 79 y.o. female with history of DM, HTN, A Fib; who was admitted on 10/26/12 with left sided weakness and slurred speech. CT with question of early ischemic changes in right insula and patient treated with TPA. Patient with seizure episode with hypoxia later that evening requiring intubation. Started on Keppra. Follow up CT head revealed evolving right MCA infarcts 4.6 X 3.8 cm. 2 D echo with EF 55-65% and no wall abnormality, moderate to severe AVR and MVR. Carotid dopplers difficult to visualize due to movement. Neurology recommends plavix for CVA prophylaxis--but to discuss NOAC with Dr. Eldridge Dace due to patient's history of A Fib. . Patient extubated yesterday and ST/PT evaluations done. She was started on regular diet and working on pre gait activities. MD recommending CIR.      Total: 2=NIH  Past Medical History  Past  Medical History  Diagnosis Date  . Diabetes mellitus   . Hypertension   . Hyperlipidemia   . Arthritis   . AF (atrial fibrillation)   . Cataract     decreased vision in left eye  . Peripheral vascular disease   . CHF (congestive heart failure)     Family History  family history is not on file.  Prior Rehab/Hospitalizations:  No previous rehab admissions.   Current Medications  Current facility-administered medications:0.9 %  sodium chloride infusion, , Intravenous, Continuous, Simonne Martinet, NP, Last Rate: 20 mL/hr at 10/27/12 0922, 20 mL at 10/27/12 8841;  acetaminophen (TYLENOL) suppository 650 mg, 650 mg, Rectal, Q4H PRN, Thana Farr, MD;  acetaminophen (TYLENOL) tablet 650 mg, 650 mg, Oral, Q4H PRN, Thana Farr, MD amiodarone (PACERONE) tablet 400 mg, 400 mg, Oral, BID, Corky Crafts, MD, 400 mg at 10/31/12 1022;  apixaban (ELIQUIS) tablet 2.5 mg, 2.5 mg, Oral, BID, Cathlyn Parsons, PA-C, 2.5 mg at 10/31/12 1022;  atorvastatin (LIPITOR) tablet 20 mg, 20 mg, Oral, q1800, Micki Riley, MD, 20 mg at 10/30/12 1656;  furosemide (LASIX) injection 40 mg, 40  mg, Intravenous, Q12H, Kalman Shan, MD, 40 mg at 10/31/12 1022 insulin aspart (novoLOG) injection 0-15 Units, 0-15 Units, Subcutaneous, TID WC & HS, Thana Farr, MD, 2 Units at 10/31/12 0841;  levETIRAcetam (KEPPRA) tablet 500 mg, 500 mg, Oral, BID, Clydia Llano, MD, 500 mg at 10/31/12 1022;  metoprolol tartrate (LOPRESSOR) tablet 25 mg, 25 mg, Oral, BID, Simonne Martinet, NP, 25 mg at 10/31/12 1022;  senna-docusate (Senokot-S) tablet 1 tablet, 1 tablet, Oral, QHS PRN, Thana Farr, MD  Patients Current Diet: Carb Control  Precautions / Restrictions Precautions Precautions: Fall Precaution Comments: Lt weakness and neglect Restrictions Weight Bearing Restrictions: No   Prior Activity Level Community (5-7x/wk): Goes out 3 X a week on Wed, Sat and Sun.  Home Assistive Devices / Equipment Home Assistive  Devices/Equipment: Environmental consultant (specify type);CBG Meter Home Equipment: Walker - 4 wheels;Tub bench  Prior Functional Level Prior Function Level of Independence: Independent with assistive device(s);Needs assistance Gait / Transfers Assistance Needed: uses rollator in home, limited outside amb - someones accompanies pt ADL's / Homemaking Assistance Needed: son does grocery shopping, pt able to do light meals Communication / Swallowing Assistance Needed: no  Current Functional Level Cognition  Arousal/Alertness: Awake/alert Overall Cognitive Status: Impaired/Different from baseline Current Attention Level: Sustained Orientation Level: Oriented X4 Safety/Judgement: Decreased awareness of safety;Decreased awareness of deficits General Comments: Pt's family having a hard time seeing deficits in pt, therefore pt also seems to have some significant insight issues. Attention: Focused;Sustained Focused Attention: Appears intact Sustained Attention: Appears intact Memory: Appears intact Awareness: Impaired Awareness Impairment: Emergent impairment Problem Solving: Impaired Problem Solving Impairment: Functional complex Executive Function: Reasoning;Self Monitoring Reasoning: Impaired Reasoning Impairment: Functional complex Self Monitoring: Impaired Self Monitoring Impairment: Functional complex Safety/Judgment: Impaired    Extremity Assessment (includes Sensation/Coordination)          ADLs  Eating/Feeding: Supervision/safety;Set up Where Assessed - Eating/Feeding: Chair Grooming: Performed;Minimal assistance;Wash/dry face Where Assessed - Grooming: Supported standing Upper Body Bathing: Moderate assistance Where Assessed - Upper Body Bathing: Unsupported sitting Lower Body Bathing: Maximal assistance Where Assessed - Lower Body Bathing: Supported sit to stand Upper Body Dressing: Moderate assistance Where Assessed - Upper Body Dressing: Supported sitting Lower Body Dressing:  Performed;Maximal assistance Where Assessed - Lower Body Dressing: Supported sit to Pharmacist, hospital: Performed;Moderate assistance Toilet Transfer Method: Sit to stand;Stand pivot Toilet Transfer Equipment: Other (comment) Toileting - Clothing Manipulation and Hygiene: Simulated;Maximal assistance Where Assessed - Toileting Clothing Manipulation and Hygiene: Standing Equipment Used: Gait belt;Rolling walker Transfers/Ambulation Related to ADLs: Pt walked to sink but ran into wall on the Left and into the computer on the L despite cues.  Pt's daughter stating that pt has more room at home and that is why she is running into objects.  Feel this pt has a significant L neglect and that is why she is running into items on the L only. ADL Comments: pt requiring max to total assist at time with all adls.    Mobility  Bed Mobility: Supine to Sit;Sitting - Scoot to [place] of Bed Rolling Left: 4: Min assist;With rail Left Sidelying to Sit: 3: Mod assist;With rails;HOB flat Supine to Sit: 4: Min assist Sitting - Scoot to [place] of Bed: 5: Supervision    Transfers  Transfers: Sit to Stand;Stand to Sit Sit to Stand: 3: Mod assist;With upper extremity assist;From bed Stand to Sit: 3: Mod assist;With upper extremity assist;To chair/3-in-1 Stand Pivot Transfers: 2: Max assist    Ambulation / Gait / Stairs / Wheelchair  Mobility  Ambulation/Gait Ambulation/Gait Assistance: 1: +2 Total assist Ambulation/Gait: Patient Percentage: 70% Ambulation Distance (Feet): 6 Feet Assistive device: Rolling walker Ambulation/Gait Assistance Details: pt remains flexed and leans on RW.  Per pt and daughter she has always done this and will not change.  pt c/o Bil knee pain from arthritis and fatigues quickly.  pt with L neglect running into objects and wall on L side.   Gait Pattern: Step-through pattern;Decreased stride length;Trunk flexed;Shuffle Stairs: No Corporate treasurer: No    Posture  / Games developer Sitting - Balance Support: Right upper extremity supported;Feet supported Static Sitting - Level of Assistance: 5: Stand by assistance Static Sitting - Comment/# of Minutes: 5 minutes.  Able to raise UE's and maintain balance for 15 seconds. Static Standing Balance Static Standing - Balance Support: Bilateral upper extremity supported Static Standing - Level of Assistance: 4: Min assist Static Standing - Comment/# of Minutes: 3    Special needs/care consideration BiPAP/CPAP No CPM No Continuous Drip IV No Dialysis No       Life Vest No Oxygen No Special Bed No Trach Size No Wound Vac (area) No      Skin: Bruises easily.  Has a red scaley patch on left leg                             Bowel mgmt: Last documented BM 10/27/12 Bladder mgmt: Voiding in bedpan and on [facility] Diabetic mgmt Yes, or oral medications at home.    Previous Home Environment Living Arrangements: Alone  Lives With: Alone Available Help at Discharge: Family;Available PRN/intermittently Type of Home: House Home Layout: One level Home Access: Stairs to enter Entrance Stairs-Rails: None Entrance Stairs-Number of Steps: 1 Home Care Services: No Additional Comments: Son takes patient for errands (groceries, hair, church)  Discharge Living Setting Plans for Discharge Living Setting: Patient's home;Alone;House Type of Home at Discharge: House Discharge Home Layout: One level Discharge Home Access: Stairs to enter Entrance Stairs-Number of Steps: 1 Does the patient have any problems obtaining your medications?: No  Social/Family/Support Systems Patient Roles: Parent (Has 2 sons and a daughter.) Contact Information: Darrel Zenovia Jordan - son  Anticipated Caregiver: Son and other family Anticipated Caregiver's Contact Information: Hillard Danker - son [phone] Ability/Limitations of Caregiver: Son works 7:30 a to [employer], Dtr-in-law keeps grand kids and runs lots of errands.  Another  son has cancer and a daughter lives in [place]. Caregiver Availability: Intermittent (Family aware that patient likely will need 24 h supervision.) Discharge Plan Discussed with Primary Caregiver: Yes Is Caregiver In Agreement with Plan?: Yes Does Caregiver/Family have Issues with Lodging/Transportation while Pt is in Rehab?: No  Goals/Additional Needs Patient/Family Goal for Rehab: PT S, OT S/Min A, ST mod I goals Expected length of stay: 2-3 weeks Cultural Considerations: Attends [employer] Dietary Needs: Carb Mod Med calorie, thin liquids Equipment Needs: TBD Additional Information: Has [employer] which dtr pays for.  They clean house monthly and take patient to MD appts. Pt/Family Agrees to Admission and willing to participate: Yes Program Orientation Provided & Reviewed with Pt/Caregiver Including Roles  & Responsibilities: Yes   Decrease burden of Care through IP rehab admission: Not applicable  Possible need for SNF placement upon discharge: Yes, but family hopes will progress enough to go home  Patient Condition: This patient's condition remains as documented in the consult dated 10/29/12, in which the Rehabilitation Physician determined and  documented that the patient's condition is appropriate for intensive rehabilitative care in an inpatient rehabilitation facility. Will admit to inpatient rehab today.  Preadmission Screen Completed By:  Trish Mage, 10/31/2012 12:48 PM ______________________________________________________________________   Discussed status with Dr. Wynn Banker on 10/31/12 at 1248 and received telephone approval for admission today.  Admission Coordinator:  Trish Mage, time1248/Date09/04/14

## 2012-10-30 NOTE — Care Management (Signed)
Case Manager has a benefits check in process for Eliquis. CM will make pt aware once complete. Gala Lewandowsky , RN,BSN [phone]

## 2012-10-30 NOTE — Progress Notes (Signed)
Rehab admissions - I met with patient and Dtr and Grand daughter.  They would like inpatient rehab prior to home with family assistance.  I will call insurance carrier, open case and attempt to get authorization for today.  It may be that I cannot get approval for inpatient rehab until tomorrow.  Call me for questions.  #[phone]

## 2012-10-31 ENCOUNTER — Inpatient Hospital Stay (HOSPITAL_COMMUNITY)
Admission: RE | Admit: 2012-10-31 | Discharge: 2012-11-08 | DRG: 945 | Disposition: A | Discharge status: Home Health | Payer: Medicare Other | Source: Intra-hospital | Attending: Physical Medicine & Rehabilitation | Admitting: Physical Medicine & Rehabilitation

## 2012-10-31 DIAGNOSIS — R29898 Other symptoms and signs involving the musculoskeletal system: Secondary | ICD-10-CM | POA: Diagnosis not present

## 2012-10-31 DIAGNOSIS — H9190 Unspecified hearing loss, unspecified ear: Secondary | ICD-10-CM | POA: Diagnosis not present

## 2012-10-31 DIAGNOSIS — M1710 Unilateral primary osteoarthritis, unspecified knee: Secondary | ICD-10-CM | POA: Diagnosis not present

## 2012-10-31 DIAGNOSIS — E785 Hyperlipidemia, unspecified: Secondary | ICD-10-CM

## 2012-10-31 DIAGNOSIS — I4891 Unspecified atrial fibrillation: Secondary | ICD-10-CM | POA: Diagnosis not present

## 2012-10-31 DIAGNOSIS — R569 Unspecified convulsions: Secondary | ICD-10-CM | POA: Diagnosis present

## 2012-10-31 DIAGNOSIS — Z602 Problems related to living alone: Secondary | ICD-10-CM

## 2012-10-31 DIAGNOSIS — E119 Type 2 diabetes mellitus without complications: Secondary | ICD-10-CM | POA: Diagnosis present

## 2012-10-31 DIAGNOSIS — R471 Dysarthria and anarthria: Secondary | ICD-10-CM | POA: Diagnosis not present

## 2012-10-31 DIAGNOSIS — I6340 Cerebral infarction due to embolism of unspecified cerebral artery: Secondary | ICD-10-CM | POA: Diagnosis present

## 2012-10-31 DIAGNOSIS — E871 Hypo-osmolality and hyponatremia: Secondary | ICD-10-CM

## 2012-10-31 DIAGNOSIS — Z950 Presence of cardiac pacemaker: Secondary | ICD-10-CM | POA: Diagnosis not present

## 2012-10-31 DIAGNOSIS — Z9181 History of falling: Secondary | ICD-10-CM

## 2012-10-31 DIAGNOSIS — I10 Essential (primary) hypertension: Secondary | ICD-10-CM | POA: Diagnosis not present

## 2012-10-31 DIAGNOSIS — E876 Hypokalemia: Secondary | ICD-10-CM | POA: Diagnosis not present

## 2012-10-31 DIAGNOSIS — Z5189 Encounter for other specified aftercare: Secondary | ICD-10-CM | POA: Diagnosis present

## 2012-10-31 DIAGNOSIS — I739 Peripheral vascular disease, unspecified: Secondary | ICD-10-CM | POA: Diagnosis not present

## 2012-10-31 DIAGNOSIS — I509 Heart failure, unspecified: Secondary | ICD-10-CM | POA: Diagnosis not present

## 2012-10-31 DIAGNOSIS — H269 Unspecified cataract: Secondary | ICD-10-CM | POA: Diagnosis not present

## 2012-10-31 DIAGNOSIS — I5042 Chronic combined systolic (congestive) and diastolic (congestive) heart failure: Secondary | ICD-10-CM

## 2012-10-31 DIAGNOSIS — I5041 Acute combined systolic (congestive) and diastolic (congestive) heart failure: Secondary | ICD-10-CM

## 2012-10-31 DIAGNOSIS — I6330 Cerebral infarction due to thrombosis of unspecified cerebral artery: Secondary | ICD-10-CM

## 2012-10-31 DIAGNOSIS — B952 Enterococcus as the cause of diseases classified elsewhere: Secondary | ICD-10-CM | POA: Diagnosis present

## 2012-10-31 DIAGNOSIS — Z882 Allergy status to sulfonamides status: Secondary | ICD-10-CM

## 2012-10-31 DIAGNOSIS — Z9089 Acquired absence of other organs: Secondary | ICD-10-CM | POA: Diagnosis not present

## 2012-10-31 DIAGNOSIS — N390 Urinary tract infection, site not specified: Secondary | ICD-10-CM | POA: Diagnosis present

## 2012-10-31 LAB — BASIC METABOLIC PANEL
BUN: 13 mg/dL (ref 6–23)
Chloride: 93 mEq/L — ABNORMAL LOW (ref 96–112)
Creatinine, Ser: 0.79 mg/dL (ref 0.50–1.10)
GFR calc Af Amer: 82 mL/min — ABNORMAL LOW (ref >90)
GFR calc non Af Amer: 71 mL/min — ABNORMAL LOW (ref >90)

## 2012-10-31 LAB — COMPREHENSIVE METABOLIC PANEL
ALT: 12 U/L (ref 0–35)
AST: 19 U/L (ref 0–37)
Albumin: 2.5 g/dL — ABNORMAL LOW (ref 3.5–5.2)
CO2: 27 mEq/L (ref 19–32)
Chloride: 95 mEq/L — ABNORMAL LOW (ref 96–112)
Creatinine, Ser: 0.81 mg/dL (ref 0.50–1.10)
Sodium: 133 mEq/L — ABNORMAL LOW (ref 135–145)
Total Bilirubin: 1 mg/dL (ref 0.3–1.2)

## 2012-10-31 MED ORDER — PROCHLORPERAZINE EDISYLATE 5 MG/ML IJ SOLN
5.0000 mg | Freq: Four times a day (QID) | INTRAMUSCULAR | Status: DC | PRN
  Filled 2012-10-31: qty 2

## 2012-10-31 MED ORDER — BIOTENE DRY MOUTH MT LIQD
15.0000 mL | Freq: Two times a day (BID) | OROMUCOSAL | Status: DC
  Administered 2012-10-31 – 2012-11-08 (×14): 15 mL via OROMUCOSAL

## 2012-10-31 MED ORDER — APIXABAN 2.5 MG PO TABS: 2.5000 mg | ORAL_TABLET | Freq: Two times a day (BID) | ORAL | Status: DC

## 2012-10-31 MED ORDER — AMIODARONE HCL 200 MG PO TABS
400.0000 mg | ORAL_TABLET | Freq: Two times a day (BID) | ORAL | Status: DC
  Administered 2012-10-31 – 2012-11-02 (×4): 400 mg via ORAL
  Filled 2012-10-31 (×6): qty 2

## 2012-10-31 MED ORDER — METFORMIN HCL 500 MG PO TABS
500.0000 mg | ORAL_TABLET | Freq: Two times a day (BID) | ORAL | Status: DC
  Administered 2012-10-31 – 2012-11-08 (×16): 500 mg via ORAL
  Filled 2012-10-31 (×18): qty 1

## 2012-10-31 MED ORDER — POLYETHYLENE GLYCOL 3350 17 G PO PACK
17.0000 g | PACK | Freq: Every day | ORAL | Status: DC | PRN
  Filled 2012-10-31: qty 1

## 2012-10-31 MED ORDER — ATORVASTATIN CALCIUM 20 MG PO TABS
20.0000 mg | ORAL_TABLET | Freq: Every day | ORAL | Status: DC
  Administered 2012-10-31 – 2012-11-07 (×8): 20 mg via ORAL
  Filled 2012-10-31 (×9): qty 1

## 2012-10-31 MED ORDER — FLEET ENEMA 7-19 GM/118ML RE ENEM: 1.0000 | ENEMA | Freq: Once | RECTAL | Status: AC | PRN

## 2012-10-31 MED ORDER — ACETAMINOPHEN 325 MG PO TABS
325.0000 mg | ORAL_TABLET | ORAL | Status: DC | PRN
  Administered 2012-11-03: 650 mg via ORAL
  Administered 2012-11-04: 325 mg via ORAL
  Filled 2012-10-31: qty 1
  Filled 2012-10-31 (×2): qty 2
  Filled 2012-10-31: qty 1

## 2012-10-31 MED ORDER — ALUM & MAG HYDROXIDE-SIMETH 200-200-20 MG/5ML PO SUSP: 30.0000 mL | ORAL | Status: DC | PRN

## 2012-10-31 MED ORDER — INSULIN ASPART 100 UNIT/ML ~~LOC~~ SOLN
0.0000 [IU] | Freq: Three times a day (TID) | SUBCUTANEOUS | Status: DC
  Administered 2012-10-31 – 2012-11-01 (×3): 2 [IU] via SUBCUTANEOUS
  Administered 2012-11-01: 3 [IU] via SUBCUTANEOUS
  Administered 2012-11-02 – 2012-11-03 (×2): 2 [IU] via SUBCUTANEOUS
  Administered 2012-11-04 – 2012-11-05 (×2): 3 [IU] via SUBCUTANEOUS
  Administered 2012-11-05 – 2012-11-07 (×3): 2 [IU] via SUBCUTANEOUS

## 2012-10-31 MED ORDER — APIXABAN 2.5 MG PO TABS
2.5000 mg | ORAL_TABLET | Freq: Two times a day (BID) | ORAL | Status: DC
  Administered 2012-10-31 – 2012-11-08 (×16): 2.5 mg via ORAL
  Filled 2012-10-31 (×18): qty 1

## 2012-10-31 MED ORDER — PROCHLORPERAZINE 25 MG RE SUPP
12.5000 mg | Freq: Four times a day (QID) | RECTAL | Status: DC | PRN
  Filled 2012-10-31: qty 1

## 2012-10-31 MED ORDER — FUROSEMIDE 10 MG/ML IJ SOLN
40.0000 mg | Freq: Two times a day (BID) | INTRAMUSCULAR | Status: DC
  Administered 2012-10-31 – 2012-11-01 (×2): 40 mg via INTRAVENOUS
  Filled 2012-10-31 (×4): qty 4

## 2012-10-31 MED ORDER — LEVETIRACETAM 500 MG PO TABS: 500.0000 mg | ORAL_TABLET | Freq: Two times a day (BID) | ORAL | Status: DC

## 2012-10-31 MED ORDER — BISACODYL 10 MG RE SUPP: 10.0000 mg | Freq: Every day | RECTAL | Status: DC | PRN

## 2012-10-31 MED ORDER — SENNOSIDES-DOCUSATE SODIUM 8.6-50 MG PO TABS: 1.0000 | ORAL_TABLET | Freq: Every evening | ORAL | Status: DC | PRN

## 2012-10-31 MED ORDER — METFORMIN HCL ER 500 MG PO TB24: 500.0000 mg | ORAL_TABLET | Freq: Two times a day (BID) | ORAL | Status: DC

## 2012-10-31 MED ORDER — PROCHLORPERAZINE MALEATE 5 MG PO TABS
5.0000 mg | ORAL_TABLET | Freq: Four times a day (QID) | ORAL | Status: DC | PRN
  Filled 2012-10-31: qty 2

## 2012-10-31 MED ORDER — METOPROLOL TARTRATE 25 MG PO TABS
25.0000 mg | ORAL_TABLET | Freq: Two times a day (BID) | ORAL | Status: DC
  Administered 2012-10-31 – 2012-11-08 (×16): 25 mg via ORAL
  Filled 2012-10-31 (×18): qty 1

## 2012-10-31 MED ORDER — POTASSIUM CHLORIDE CRYS ER 20 MEQ PO TBCR
20.0000 meq | EXTENDED_RELEASE_TABLET | Freq: Three times a day (TID) | ORAL | Status: DC
  Administered 2012-10-31 – 2012-11-04 (×11): 20 meq via ORAL
  Filled 2012-10-31 (×14): qty 1

## 2012-10-31 MED ORDER — DICLOFENAC SODIUM 1 % TD GEL
2.0000 g | Freq: Four times a day (QID) | TRANSDERMAL | Status: DC
  Administered 2012-10-31 – 2012-11-08 (×23): 2 g via TOPICAL
  Filled 2012-10-31: qty 100

## 2012-10-31 MED ORDER — GUAIFENESIN-DM 100-10 MG/5ML PO SYRP
5.0000 mL | ORAL_SOLUTION | Freq: Four times a day (QID) | ORAL | Status: DC | PRN
  Administered 2012-10-31 – 2012-11-03 (×2): 10 mL via ORAL
  Filled 2012-10-31 (×2): qty 10

## 2012-10-31 MED ORDER — LEVETIRACETAM 500 MG PO TABS
500.0000 mg | ORAL_TABLET | Freq: Two times a day (BID) | ORAL | Status: DC
  Administered 2012-10-31 – 2012-11-08 (×16): 500 mg via ORAL
  Filled 2012-10-31 (×18): qty 1

## 2012-10-31 NOTE — Discharge Summary (Signed)
Physician Discharge Summary  [name] ZOX:[id number] DOB: 05/04/1918 DOA: 10/26/2012  PCP: [name], MD  Admit date: 10/26/2012 Discharge date: 10/31/2012  Time spent: 40 minutes  Recommendations for Outpatient Follow-up:  1. Followup with primary care physician within one week. 2. Followup with Dr. Pearlean Brownie in one month  Discharge Diagnoses:  Active Problems:   A-fib   Cerebral embolism with cerebral infarction   Seizure   Acute respiratory failure   Intracranial hematoma   Discharge Condition: Stable  Diet recommendation: Heart healthy  Filed Weights   10/26/12 0707 10/26/12 0830  Weight: 90.833 kg (200 lb 4 oz) 89 kg (196 lb 3.4 oz)    History of present illness:  [name] is an 79 y.o. female who was at home with her daughter. She awakened to use the bathroom and was able to ambulate and speak to her daughter. When her daughter got up later in the morning the patient had slurred speech. While they were talking the patient developed left sided weakness as well. EMS was called and the patient was brought in as a code stroke. Initial NIHSS of 21.  Hospital Course:   1. Acute CVA: Acute non-hemorrhagic CVA involving the right MCA territory, patient presents to the hospital with left-sided weakness and slurred speech, had NIHSS a score of 21, admitted initially by neurology service, t-PA was given in the ED, patient did have improvement of her left-sided weakness, patient developed weakness seizure and became hypoxic and required intubation to protect her airways and to correct the hypoxemia. As mentioned above at the time of admission CT head showed no acute abnormality is, t-PA was given, patient does have history of atrial fibrillation and she was on Coumadin before. This is likely the cause of the stroke, patient was evaluated by PT OT at the time of discharge and recommended inpatient rehabilitation service. Later CT scan showed evolving right MCA stroke.  2.  Acute respiratory failure: Ventilator dependent respiratory failure, as mentioned above patient developed a seizure and became hypoxic in the emergency department as he was intubated. She was extubated the very next day which is on 10/27/2012. Patient now does not need oxygen.  3. Atrial fibrillation: Rate is controlled, patient was on aspirin 162 mg, with a stroke on aspirin this considered aspirin failure. Patient was on Coumadin about 2 years ago when she was taken off of that secondary to a fall. Neurology and cardiology recommended anticoagulation, after more than 24 hours after the t-PA was given patient was started initially on Plavix but the recommendation change to Eliquis 2.5 mg twice a day. As needed preauthorization I called her insurance company for preauthorization number is # [id number].  4. Diabetes mellitus type 2: Controlled, hemoglobin A1c is 6.9, patient was on extended-release metformin 500 mg she was taking that 3 times a day, on discharge dose adjusted to 500 mg XR, twice a day  5. Dyslipidemia: On standing prior to admission we'll continue.  6. Acute on chronic diastolic CHF: Patient has grade 3 diastolic dysfunction, ejection fraction in the range of 55-65%. Patient is on metoprolol and Bumex 1 mg daily at home, Bumex was switched to IV Lasix during this hospital stay, on discharge Bumex restarted again.  7. Acute renal failure: Patient presented with creatinine of 1.4, her diuresis was held on IV fluids given, the aorta of resolved right away, with the IV fluids probably that exacerbated her chronic diastolic CHF, use of Lasix was needed afterwards. On discharge she is back  on her regular dose of Bumex at home. She needs close followup for her potassium levels.  Procedures:  2-D echo:Study Conclusions  - Left ventricle: The cavity size was normal. Wall thickness was normal. Systolic function was normal. The estimated ejection fraction was in the range of 55% to 65%.  Wall motion was normal; there were no regional wall motion abnormalities. Doppler parameters are consistent with a reversible restrictive pattern, indicative of decreased left ventricular diastolic compliance and/or increased left atrial pressure (grade 3 diastolic dysfunction). Doppler parameters are consistent with high ventricular filling pressure. - Aortic valve: Moderate to severe regurgitation. - Mitral valve: Moderately to severely calcified annulus. Severely thickened, moderately calcified leaflets anterior. Severe thickening and calcification, with involvement of anterior leaflet chords. Cannot exclude vegetation. Mild to moderate regurgitation. - Pericardium, extracardiac: There was a left pleural effusion.   Carotid duplex: Right: mild calcific plaque origin ICA. Left: moderate soft plaque origin ICA. Unable to adequately visualize ICAs bilaterally. Right vertebral artery flow is antegrade. Unable to visualize left vertebral artery.  Consultations:  Patient was under critical care medicine team, transferred to the hospitalist service on 10/30/2012.  Cardiology Dr. Jim Like.  Neurology Dr. Pearlean Brownie  Discharge Exam: Filed Vitals:   10/31/12 1007  BP: 110/52  Pulse: 98  Temp: 98.2 F (36.8 C)  Resp: 18   General: Alert and awake, oriented x3, not in any acute distress. HEENT: anicteric sclera, pupils reactive to light and accommodation, EOMI CVS: S1-S2 clear, no murmur rubs or gallops Chest: clear to auscultation bilaterally, no wheezing, rales or rhonchi Abdomen: soft nontender, nondistended, normal bowel sounds, no organomegaly Extremities: no cyanosis, clubbing or edema noted bilaterally Neuro: Cranial nerves II-XII intact, no focal neurological deficits  Discharge Instructions     Medication List    STOP taking these medications       aspirin 81 MG tablet     beta carotene w/minerals tablet     cyanocobalamin 500 MCG tablet     furosemide 20 MG  tablet  Commonly known as:  LASIX     lisinopril 10 MG tablet  Commonly known as:  PRINIVIL,ZESTRIL     ondansetron 4 MG tablet  Commonly known as:  ZOFRAN      TAKE these medications       amiodarone 400 MG tablet  Commonly known as:  PACERONE  Take 400 mg by mouth daily.     apixaban 2.5 MG Tabs tablet  Commonly known as:  ELIQUIS  Take 1 tablet (2.5 mg total) by mouth 2 (two) times daily.     bumetanide 1 MG tablet  Commonly known as:  BUMEX  Take 1 mg by mouth daily.     levETIRAcetam 500 MG tablet  Commonly known as:  KEPPRA  Take 1 tablet (500 mg total) by mouth 2 (two) times daily.     metFORMIN 500 MG 24 hr tablet  Commonly known as:  GLUCOPHAGE-XR  Take 1 tablet (500 mg total) by mouth 2 (two) times daily.     metoprolol tartrate 25 MG tablet  Commonly known as:  LOPRESSOR  Take 25 mg by mouth 2 (two) times daily.     simvastatin 40 MG tablet  Commonly known as:  ZOCOR  Take 40 mg by mouth every evening.       Allergies  Allergen Reactions  . Nitrofurantoin Nausea And Vomiting  . Sulfa Antibiotics Hives       Follow-up Information   Follow up with FULP, CAMMIE, MD In  1 week.   Specialty:  Family Medicine   Contact information:   [address] [place], [place] [address] [place] [address] [phone]       Follow up with Gates Rigg, MD In 1 month.   Specialties:  Neurology, Radiology   Contact information:   [address] [place] [address] [phone]        The results of significant diagnostics from this hospitalization (including imaging, microbiology, ancillary and laboratory) are listed below for reference.    Significant Diagnostic Studies: Dg Chest 2 View  10/12/2012   CLINICAL DATA:  Shortness of Breath. Fatigue.  EXAM: CHEST  2 VIEW  COMPARISON:  03/17/2007  FINDINGS: New bibasilar pulmonary opacity is seen which may be due to atelectasis or infiltrates. Small pleural effusions are also new. Heart size is  within normal limits. Dual lead transvenous pacemaker remains in appropriate position.  IMPRESSION: New bibasilar atelectasis versus infiltrates and small pleural effusions.   Electronically Signed   By: Myles Rosenthal   On: 10/12/2012 12:05   Ct Head Wo Contrast  10/27/2012   *RADIOLOGY REPORT*  Clinical Data: Status post t-PA  CT HEAD WITHOUT CONTRAST  Technique:  Contiguous axial images were obtained from the base of the skull through the vertex without contrast.  Comparison: 10/26/2012  Findings: The bony calvarium is intact.  There are again noted changes consistent with infarcts in the right posterior parietal region.  This area measures approximately 4.6 x 3.8 cm in greatest dimension.  No significant mass effect upon the adjacent ventricle is noted.  Mild chronic white matter ischemic change is again seen. The hyperdensity seen in the branches of the right middle cerebral artery is less well visualized on the current study.  No acute hemorrhages seen.  IMPRESSION: Evolving right middle cerebral artery infarct.  No acute hemorrhage is noted.  Continued follow-up is recommended as indicated.   Original Report Authenticated By: Alcide Clever, M.D.   Ct Head Wo Contrast  10/26/2012   *RADIOLOGY REPORT*  Clinical Data: Acute right-sided CVA with new onset respiratory failure.  CVA treated with t-PA.Seizure.  CT HEAD WITHOUT CONTRAST  Technique:  Contiguous axial images were obtained from the base of the skull through the vertex without contrast.  Comparison: 10/26/2012.  Findings: Subacute infarction is present in the posterior right MCA territory with decreased attenuation of the gray and white matter. Posterior fossa structures appear within normal limits.  There is no hemorrhage.  Benign basal ganglia calcifications are present. On image number 11, they are this ICU patient in the posterior MCA branch, probably representing thrombosis.  There is no midline shift or hydrocephalus.  The calvarium appears  intact.  IMPRESSION: Developing posterior right MCA division infarct.  No hemorrhage or complicating features in this patient with seizure.   Original Report Authenticated By: Andreas Newport, M.D.   Ct Head Wo Contrast  10/26/2012   *RADIOLOGY REPORT*  Clinical Data: Slurred speech and left-sided weakness.  Code stroke.  CT HEAD WITHOUT CONTRAST  Technique:  Contiguous axial images were obtained from the base of the skull through the vertex without contrast.  Comparison: 07/01/2010.  Findings:  Skull:No acute osseous abnormality. No lytic or blastic lesion.  Orbits: Right cataract resection.  Brain: No evidence of acute abnormality, such as acute infarction, hemorrhage, hydrocephalus, or mass lesion/mass effect (questionable loss of posterior right insula gray-white differentiation, not definite given the scan quality).  Similar pattern of patchy bilateral cerebral white matter low attenuation.  Low attenuation  in the of cortical left occipital pole was likely present previously, and does not explain provided history.  These results were called by telephone on 10/26/2012 at 07:30 a.m. to Dr. Thad Ranger, who verbally acknowledged these results.  IMPRESSION: 1. No acute intracranial hemorrhage. 2. Questionable early ischemic changes in the posterior right insula.  3.  Chronic small vessel ischemia.   Original Report Authenticated By: Tiburcio Pea   Dg Chest Port 1 View  10/29/2012   *RADIOLOGY REPORT*  Clinical Data: Right pleural effusion.  PORTABLE CHEST - 1 VIEW  Comparison: Plain film chest 10/27/2012 and 10/28/2012.  Findings: The patient's right pleural effusion and associated atelectasis appear improved.  There has been interval worsening of the patient's left pleural effusion and atelectasis.  Cardiomegaly is noted.  No pneumothorax.  IMPRESSION: Decreased right and increased left pleural effusions and atelectasis.   Original Report Authenticated By: Holley Dexter, M.D.   Dg Chest Port 1  View  10/28/2012   *RADIOLOGY REPORT*  Clinical Data: Cerebral infarction, respiratory failure and extubation.  PORTABLE CHEST - 1 VIEW  Comparison: 10/27/2012  Findings: Endotracheal and nasogastric tubes been removed. Pacemaker shows stable positioning.  There remain bilateral lower lobe atelectasis/infiltrates with bilateral pleural effusions, right greater than left.  The heart size is stable.  No overt pulmonary edema is seen.  IMPRESSION: Stable bilateral lower lobe consolidation and pleural effusions.   Original Report Authenticated By: Irish Lack, M.D.   Dg Chest Port 1 View  10/27/2012   *RADIOLOGY REPORT*  Clinical Data: Check endotracheal tube  PORTABLE CHEST - 1 VIEW  Comparison: 10/26/2012  Findings: The endotracheal tube and nasogastric catheter are again identified and stable.  A pacing device is again seen.  Increased density is noted over the bases bilaterally worse than that seen on the prior exam.  This is consistent with bilateral pleural effusions.  Likely underlying atelectasis is present.  No definitive pneumothorax is seen.  IMPRESSION: Increasing bilateral pleural effusions.   Original Report Authenticated By: Alcide Clever, M.D.   Dg Chest Port 1 View  10/26/2012   *RADIOLOGY REPORT*  Clinical Data: Endotracheal tube placement.  Evaluate ET tube.  PORTABLE CHEST - 1 VIEW  Comparison: 10/12/2012.  Findings: The endotracheal tube tip is 31 mm from the carina. Interval intubation.  Enteric tube is present with the tip not visualized.  Defibrillator pads overlie the chest.  Left subclavian dual lead pacemaker.  Right costophrenic angle is excluded from view.  There is lucency in the left upper quadrant suspicious for intra-abdominal free air. Development of bilateral effusions, basilar atelectasis and basilar airspace disease.  IMPRESSION: 1.  Interval intubation with endotracheal tube tip 31 mm from the carina. 2.  Enteric tube present with the tip not visualized. 3.  Possible free  intra-abdominal air in the left upper quadrant. Critical Value/emergent results were called by telephone at the time of interpretation on 10/26/2012 at 1519 hours to [facility], RN, who verbally acknowledged these results. 4.  Development of pleural effusions and basilar airspace disease likely representing CHF.   Original Report Authenticated By: Andreas Newport, M.D.    Microbiology: Recent Results (from the past 240 hour(s))  MRSA PCR SCREENING     Status: None   Collection Time    10/26/12  8:40 AM      Result Value Range Status   MRSA by PCR NEGATIVE  NEGATIVE Final   Comment:            The GeneXpert MRSA Assay (  FDA     approved for NASAL specimens     only), is one component of a     comprehensive MRSA colonization     surveillance program. It is not     intended to diagnose MRSA     infection nor to guide or     monitor treatment for     MRSA infections.     Labs: Basic Metabolic Panel:  Recent Labs Lab 10/27/12 1329 10/28/12 0624 10/29/12 0945 10/30/12 0611 10/31/12 0850  NA 135 136 134* 136 132*  K 3.9 3.8 3.4* 2.9* 4.3  CL 94* 96 94* 96 93*  CO2 24 26 26 29 26   GLUCOSE 168* 112* 146* 141* 205*  BUN 12 12 13 12 13   CREATININE 0.79 0.72 0.78 0.76 0.79  CALCIUM 7.1* 7.2* 7.8* 7.4* 8.2*  MG  --   --   --  1.1*  --   PHOS  --   --   --  2.2*  --    Liver Function Tests:  Recent Labs Lab 10/26/12 0650 10/27/12 0550 10/28/12 0624  AST 21 18 17   ALT 11 10 9   ALKPHOS 50 46 50  BILITOT 0.7 0.8 1.2  PROT 6.5 5.6* 5.7*  ALBUMIN 3.5 2.9* 2.8*   No results found for this basename: LIPASE, AMYLASE,  in the last 168 hours No results found for this basename: AMMONIA,  in the last 168 hours CBC:  Recent Labs Lab 10/26/12 0650 10/26/12 0658 10/27/12 0550 10/28/12 0624 10/29/12 0945  WBC 7.5  --  14.9* 11.9* 9.8  NEUTROABS 4.0  --   --   --   --   HGB 13.3 14.6 12.9 13.4 14.6  HCT 39.3 43.0 38.3 39.5 42.5  MCV 90.6  --  91.0 91.9 90.2  PLT 327   --  254 185 260   Cardiac Enzymes:  Recent Labs Lab 10/26/12 0650  TROPONINI <0.30   BNP: BNP (last 3 results)  Recent Labs  10/12/12 1052 10/29/12 0933 10/30/12 0611  PROBNP 9840.0* 4185.0* 5109.0*   CBG:  Recent Labs Lab 10/30/12 0728 10/30/12 1152 10/30/12 1619 10/30/12 2013 10/31/12 0724  GLUCAP 126* 146* 129* 127* 147*       Signed:  Stepheni Cameron A  [facility] 10/31/2012, 11:49 AM

## 2012-10-31 NOTE — Progress Notes (Signed)
Rehab admissions - I have approval from [facility] for acute inpatient rehab admission.  Bed available and can admit to inpatient rehab today.  Call me for questions.  #[phone]

## 2012-10-31 NOTE — Progress Notes (Signed)
Utilization review completed.  

## 2012-10-31 NOTE — Progress Notes (Signed)
Educated pt. With the flutter. Pt. Performs the flutter well with no issues.

## 2012-10-31 NOTE — H&P (Signed)
Physical Medicine and Rehabilitation Admission H&P      Chief Complaint   Patient presents with   .  Left facial weakness, slurred speech, left sided weakness    : HPI:  [name] is a 79 y.o. female with history of DM, HTN, A Fib; who was admitted on 10/26/12 with left sided weakness and slurred speech. CT with question of early ischemic changes in right insula and patient treated with TPA. Patient with seizure episode with hypoxia later that evening requiring intubation. Started on Keppra. Follow up CT head revealed evolving right MCA infarcts 4.6 X 3.8 cm. 2 D echo with EF 55-65% and no wall abnormality, moderate to severe AVR and MVR. Carotid dopplers difficult to visualize due to movement. Neurology recommends plavix for CVA prophylaxis--but to discuss NOAC with Dr. Eldridge Dace due to patient's history of A Fib.  Patient had been taken off coumadin in 2012 due to history of falls and has had recent medication changes to help with A Fib control.  Amiodarone increased to bid for rate control and Eliquis reccommended for CVA prophylaxis    She was extubated without difficulty and tolerating regular diet. IV diuretic added for development of pleural effusions.  Therapies initiated and CIR recommended by therapy team.   Pt has had Right knee pain with hx of OA.  Daughter notes poor appetite   Review of Systems  HENT: Negative for hearing loss.   Eyes: Positive for blurred vision (H/o retinal occlusion on left with loss of central vision. ).  Respiratory: Positive for cough. Negative for shortness of breath and wheezing.   Cardiovascular: Negative for chest pain and palpitations.  Gastrointestinal: Negative for heartburn and nausea. + constipation Genitourinary: Negative for urgency.  Musculoskeletal: Positive for joint pain (bilateral knee OA with limited mobility.  worse today).  Neurological: Negative for headaches.   Past Medical History   Diagnosis  Date   .  Diabetes mellitus      .  Hypertension     .  Hyperlipidemia     .  Arthritis     .  AF (atrial fibrillation)     .  Cataract         decreased vision in left eye   .  Peripheral vascular disease     .  CHF (congestive heart failure)      Past Surgical History   Procedure  Laterality  Date   .  Pacemaker insertion    2008   .  Cholecystectomy       .  Thyroid surgery        No family history on file.   Social History: Widowed. Lives alone. Uses RW. Has assistance with house work once a month. Sedentary--goes out with son twice a week. Son in process of moving to [place] with plans to move  Mother into their home. . She reports that she has never smoked. She has never used smokeless tobacco. She reports that she does not drink alcohol or use illicit drugs      Allergies   Allergen  Reactions   .  Nitrofurantoin  Nausea And Vomiting   .  Sulfa Antibiotics  Hives    Medications Prior to Admission   Medication  Sig  Dispense  Refill   .  amiodarone (PACERONE) 400 MG tablet  Take 400 mg by mouth daily.         .  bumetanide (BUMEX) 1 MG tablet  Take 1 mg by mouth daily.         Marland Kitchen  metoprolol tartrate (LOPRESSOR) 25 MG tablet  Take 25 mg by mouth 2 (two) times daily.         .  simvastatin (ZOCOR) 40 MG tablet  Take 40 mg by mouth every evening.         .  [DISCONTINUED] aspirin 81 MG tablet  Take 160 mg by mouth daily.         .  [DISCONTINUED] beta carotene w/minerals (OCUVITE) tablet  Take 1 tablet by mouth daily.         .  [DISCONTINUED] cyanocobalamin 500 MCG tablet  Take 500 mcg by mouth every morning.         .  [DISCONTINUED] furosemide (LASIX) 20 MG tablet  Take 1 tablet (20 mg total) by mouth daily.   30 tablet   0   .  [DISCONTINUED] lisinopril (PRINIVIL,ZESTRIL) 10 MG tablet  Take 1 tablet (10 mg total) by mouth daily.   30 tablet   0   .  [DISCONTINUED] metFORMIN (GLUCOPHAGE-XR) 500 MG 24 hr tablet  Take 500 mg by mouth 3 (three) times daily.          .  [DISCONTINUED] ondansetron (ZOFRAN) 4 MG  tablet  Take 4 mg by mouth every 8 (eight) hours as needed for nausea.            Home: Home Living Family/patient expects to be discharged to:: Skilled nursing facility Living Arrangements: Alone Available Help at Discharge: Family;Available PRN/intermittently Type of Home: House Home Access: Stairs to enter [employer] of Steps: 1 Entrance Stairs-Rails: None Home Layout: One level Home Equipment: Walker - 4 wheels;Tub bench Additional Comments: Son takes patient for errands (groceries, hair, church)  Lives With: Alone    Functional History: Prior Function Vocation: Retired   Functional Status:   Mobility: Bed Mobility Bed Mobility: Supine to Sit;Sitting - Scoot to [place] of Bed Rolling Left: 4: Min assist;With rail Left Sidelying to Sit: 3: Mod assist;With rails;HOB flat Supine to Sit: 4: Min assist Sitting - Scoot to [employer] of Bed: 5: Supervision Transfers Transfers: Sit to Stand;Stand to Sit Sit to Stand: 3: Mod assist;With upper extremity assist;From bed Stand to Sit: 3: Mod assist;With upper extremity assist;To chair/3-in-1 Stand Pivot Transfers: 2: Max assist Ambulation/Gait Ambulation/Gait Assistance: 1: +2 Total assist Ambulation/Gait: Patient Percentage: 70% Ambulation Distance (Feet): 6 Feet Assistive device: Rolling walker Ambulation/Gait Assistance Details: pt remains flexed and leans on RW.  Per pt and daughter she has always done this and will not change.  pt c/o Bil knee pain from arthritis and fatigues quickly.  pt with L neglect running into objects and wall on L side.    Gait Pattern: Step-through pattern;Decreased stride length;Trunk flexed;Shuffle Stairs: No Wheelchair Mobility Wheelchair Mobility: No   ADL: ADL Eating/Feeding: Supervision/safety;Set up Where Assessed - Eating/Feeding: Chair Grooming: Performed;Minimal assistance;Wash/dry face Where Assessed - Grooming: Supported standing Upper Body Bathing: Moderate assistance Where  Assessed - Upper Body Bathing: Unsupported sitting Lower Body Bathing: Maximal assistance Where Assessed - Lower Body Bathing: Supported sit to stand Upper Body Dressing: Moderate assistance Where Assessed - Upper Body Dressing: Supported sitting Lower Body Dressing: Performed;Maximal assistance Where Assessed - Lower Body Dressing: Supported sit to Pharmacist, hospital: Performed;Moderate assistance Toilet Transfer Method: Sit to stand;Stand pivot Toilet Transfer Equipment: Other (comment) Equipment Used: Gait belt;Rolling walker Transfers/Ambulation Related to ADLs: Pt walked to sink but ran into wall on the Left and into the computer on the L despite cues.  Pt's daughter stating  that pt has more room at home and that is why she is running into objects.  Feel this pt has a significant L neglect and that is why she is running into items on the L only. ADL Comments: pt requiring max to total assist at time with all adls.   Cognition: Cognition Overall Cognitive Status: Impaired/Different from baseline Arousal/Alertness: Awake/alert Orientation Level: Oriented X4 Attention: Focused;Sustained Focused Attention: Appears intact Sustained Attention: Appears intact Memory: Appears intact Awareness: Impaired Awareness Impairment: Emergent impairment Problem Solving: Impaired Problem Solving Impairment: Functional complex Executive Function: Reasoning;Self Monitoring Reasoning: Impaired Reasoning Impairment: Functional complex Self Monitoring: Impaired Self Monitoring Impairment: Functional complex Safety/Judgment: Impaired Cognition Arousal/Alertness: Awake/alert Behavior During Therapy: WFL for tasks assessed/performed Overall Cognitive Status: Impaired/Different from baseline Area of Impairment: Attention;Awareness;Orientation;Safety/judgement;Problem solving Orientation Level: Disoriented to;Situation Current Attention Level: Sustained Memory: Decreased recall of  precautions Safety/Judgement: Decreased awareness of safety;Decreased awareness of deficits Awareness: Emergent Problem Solving: Slow processing;Requires verbal cues General Comments: Pt's family having a hard time seeing deficits in pt, therefore pt also seems to have some significant insight issues.   Physical Exam: Blood pressure 110/52, pulse 98, temperature 98.2 F (36.8 C), temperature source Oral, resp. rate 18, height 5\' 2"  (1.575 m), weight 89 kg (196 lb 3.4 oz), SpO2 99.00%.   Physical Exam  Nursing note and vitals reviewed. Constitutional: She is oriented to person, place, and time. She appears well-developed and well-nourished.  HENT:   Head: Normocephalic and atraumatic.  Eyes: Conjunctivae are normal. Pupils are equal, round, and reactive to light.  Neck: Normal range of motion. Neck supple.  Cardiovascular: Normal rate.  An irregularly irregular rhythm present.  Pulmonary/Chest: No respiratory distress. She has decreased breath sounds in the right lower field and the left lower field. She has no wheezes.  Abdominal: Soft. Bowel sounds are normal. She exhibits no distension. There is no tenderness.  Musculoskeletal: She exhibits no edema.  Discomfort and decreased ROM bilateral knees   Neurological: She is alert and oriented to person, place, and time.  Follows commands without difficulty. Speech clear. LUE with pronator drift. Left sided weakness, 3/5 Delt, bi, tri, grip 4/5 L HF KE, 3-/5 ADF/PF , effort inconsistent however. Has grossly intact sense to pain and light touch. 4/5 R side except 3- HF Cognitively fairly intact. Is very  HOH  Skin: Skin is warm and dry. No erythema.  Diffuse ecchymosis left forearm and petechial area on BLE.   Psychiatric: She has a normal mood and affect. Her behavior is normal. Thought content normal.     Results for orders placed during the hospital encounter of 10/26/12 (from the past 48 hour(s))   GLUCOSE, CAPILLARY     Status: Abnormal      Collection Time      10/29/12 12:14 PM       Result  Value  Range     Glucose-Capillary  145 (*)  70 - 99 mg/dL   GLUCOSE, CAPILLARY     Status: Abnormal     Collection Time      10/29/12  5:34 PM       Result  Value  Range     Glucose-Capillary  133 (*)  70 - 99 mg/dL   GLUCOSE, CAPILLARY     Status: Abnormal     Collection Time      10/29/12  9:49 PM       Result  Value  Range     Glucose-Capillary  158 (*)  70 -  99 mg/dL   MAGNESIUM     Status: Abnormal     Collection Time      10/30/12  6:11 AM       Result  Value  Range     Magnesium  1.1 (*)  1.5 - 2.5 mg/dL   PHOSPHORUS     Status: Abnormal     Collection Time      10/30/12  6:11 AM       Result  Value  Range     Phosphorus  2.2 (*)  2.3 - 4.6 mg/dL   BASIC METABOLIC PANEL     Status: Abnormal     Collection Time      10/30/12  6:11 AM       Result  Value  Range     Sodium  136   135 - 145 mEq/L     Potassium  2.9 (*)  3.5 - 5.1 mEq/L     Chloride  96   96 - 112 mEq/L     CO2  29   19 - 32 mEq/L     Glucose, Bld  141 (*)  70 - 99 mg/dL     BUN  12   6 - 23 mg/dL     Creatinine, Ser  1.61   0.50 - 1.10 mg/dL     Calcium  7.4 (*)  8.4 - 10.5 mg/dL     GFR calc non Af Amer  72 (*)  >90 mL/min     GFR calc Af Amer  83 (*)  >90 mL/min     Comment:  (NOTE)        The eGFR has been calculated using the CKD EPI equation.        This calculation has not been validated in all clinical situations.        eGFR's persistently <90 mL/min signify possible Chronic Kidney        Disease.   PRO B NATRIURETIC PEPTIDE     Status: Abnormal     Collection Time      10/30/12  6:11 AM       Result  Value  Range     Pro B Natriuretic peptide (BNP)  5109.0 (*)  0 - 450 pg/mL   GLUCOSE, CAPILLARY     Status: Abnormal     Collection Time      10/30/12  7:28 AM       Result  Value  Range     Glucose-Capillary  126 (*)  70 - 99 mg/dL   GLUCOSE, CAPILLARY     Status: Abnormal     Collection Time      10/30/12 11:52 AM        Result  Value  Range     Glucose-Capillary  146 (*)  70 - 99 mg/dL   GLUCOSE, CAPILLARY     Status: Abnormal     Collection Time      10/30/12  4:19 PM       Result  Value  Range     Glucose-Capillary  129 (*)  70 - 99 mg/dL     Comment 1  Notify RN      GLUCOSE, CAPILLARY     Status: Abnormal     Collection Time      10/30/12  8:13 PM       Result  Value  Range     Glucose-Capillary  127 (*)  70 - 99 mg/dL  Comment 1  Notify RN      GLUCOSE, CAPILLARY     Status: Abnormal     Collection Time      10/31/12  7:24 AM       Result  Value  Range     Glucose-Capillary  147 (*)  70 - 99 mg/dL   BASIC METABOLIC PANEL     Status: Abnormal     Collection Time      10/31/12  8:50 AM       Result  Value  Range     Sodium  132 (*)  135 - 145 mEq/L     Potassium  4.3   3.5 - 5.1 mEq/L     Comment:  SLIGHT HEMOLYSIS     Chloride  93 (*)  96 - 112 mEq/L     CO2  26   19 - 32 mEq/L     Glucose, Bld  205 (*)  70 - 99 mg/dL     BUN  13   6 - 23 mg/dL     Creatinine, Ser  9.62   0.50 - 1.10 mg/dL     Calcium  8.2 (*)  8.4 - 10.5 mg/dL     GFR calc non Af Amer  71 (*)  >90 mL/min     GFR calc Af Amer  82 (*)  >90 mL/min     Comment:  (NOTE)        The eGFR has been calculated using the CKD EPI equation.        This calculation has not been validated in all clinical situations.        eGFR's persistently <90 mL/min signify possible Chronic Kidney        Disease.    No results found.   Post Admission Physician Evaluation: Functional deficits secondary  to Embolic R MCA infarct. Patient is admitted to receive collaborative, interdisciplinary care between the physiatrist, rehab nursing staff, and therapy team. Patient's level of medical complexity and substantial therapy needs in context of that medical necessity cannot be provided at a lesser intensity of care such as a SNF. Patient has experienced substantial functional loss from his/her baseline which was documented above under the  "Functional History" and "Functional Status" headings.  Judging by the patient's diagnosis, physical exam, and functional history, the patient has potential for functional progress which will result in measurable gains while on inpatient rehab.  These gains will be of substantial and practical use upon discharge  in facilitating mobility and self-care at the household level. Physiatrist will provide 24 hour management of medical needs as well as oversight of the therapy plan/treatment and provide guidance as appropriate regarding the interaction of the two. 24 hour rehab nursing will assist with bladder management, bowel management, safety, skin/wound care, disease management, medication administration, pain management and patient education  and help integrate therapy concepts, techniques,education, etc. PT will assess and treat for/with: pre gait, gait training, endurance , safety, equipment, neuromuscular re education.   Goals are: Supervision with mobility. OT will assess and treat for/with: ADLs, Cognitive perceptual skills, Neuromuscular re education, safety, endurance, equipment.   Goals are: Sup ADLs. SLP will assess and treat for/with: Eval cognition,dysarthria.  Goals are: Assist with med management, 100% speech intelligibility. Case Management and Social Worker will assess and treat for psychological issues and discharge planning. Team conference will be held weekly to assess progress toward goals and to determine barriers to discharge. Patient will receive at least 3  hours of therapy per day at least 5 days per week. ELOS: 2 wks        Prognosis:  good     Medical Problem List and Plan: 1. DVT Prophylaxis/Anticoagulation: Pharmaceutical: Other (comment)--Eliquis 2. Pain Management:  Will add voltaren gel to help with chronic knee problems 3. Mood:  Provide ego support regarding life changes. Patient would like to be able to go back to her own home. Will have LCSW follow for evaluation  and support.   4. Neuropsych: This patient is capable of making decisions on her own behalf. 5. A trial Fibrillation: Monitor HR with bid checks. Continue amiodarone 400 mg bid with metoprolol 25 mg bid. 6. Hyponatremia/ Hypokalemia: Has been getting IV lasix on bid basis. Will add K+ supplement.  Will follow up on electrolyte abnormalities in am.   8.  DM type 2: Monitor BS with ac/hs cbg checks. Resume metformin. Use SSI for elevated BS. 9.  Chronic mixed CHF: Recent admission for overload 08/15-08/20/14. Now with Left > Right pleural effusions--- continue IV lasix and recheck CXR in am. Will change to po if improved.  Check daily weights. Low salt diet.          Erick Colace M.D. [facility] FAAPM&R (Sports Med, Neuromuscular Med) Diplomate Am Board of Electrodiagnostic Med Diplomate Am Board of Pain Medicine Fellow Am Board of Interventional Pain Physicians 10/31/2012

## 2012-10-31 NOTE — Progress Notes (Signed)
Occupational Therapy Treatment Patient Details Name: Kenady Doxtater MRN: [id number] DOB: 1918/03/25 Today's Date: 10/31/2012 Time: 4098-1191 OT Time Calculation (min): 31 min  OT Assessment / Plan / Recommendation  History of present illness Patient is a 79 yo female admitted with Lt sided weakness and slurred speech.  Patient with Rt. CVA and received tPA. Developed witnessed seizure, became hypoxic and required intubation.  Was extubated 10/27/12.  Patient with h/o pacemaker, A-fib, HTN.   OT comments  Pt making progress. Increased strength and spontaneous use L UE. Mod vc to scan to see items in L field. Slow initiation and problem solving. Discussed rehab POC with daughter, educating daughter on cognitive and visual perceptual deficits and their impact on safety and ADL and the recovery process. Discussed likelihood that pt will need 24/7 assistance after D/C from rehab center. Family is in hopes that pt will be accepted to CIR. IF CIR is not an option, pt will need SNF. Will continue to follow.  Follow Up Recommendations  Supervision/Assistance - 24 hour;Other (comment);CIR    Barriers to Discharge       Equipment Recommendations  3 in 1 bedside comode    Recommendations for Other Services Rehab consult  Frequency Min 3X/week   Progress towards OT Goals Progress towards OT goals: Progressing toward goals  Plan Discharge plan remains appropriate    Precautions / Restrictions Precautions Precautions: Fall Precaution Comments: Lt weakness and inattention   Pertinent Vitals/Pain no apparent distress  C/o B knee pain - did not rate    ADL  Eating/Feeding: Supervision/safety;Set up (not seeing objects on L side of tray) Lower Body Dressing: Maximal assistance (able to assist with donning slippers.) Equipment Used: Gait belt Transfers/Ambulation Related to ADLs: mod A sit - stand. limited by painful knees ADL Comments: L field cut    OT Diagnosis:    OT Problem List:   OT  Treatment Interventions:     OT Goals(current goals can now be found in the care plan section) Acute Rehab OT Goals Patient Stated Goal: To be able to go home OT Goal Formulation: With patient Time For Goal Achievement: 11/11/12 Potential to Achieve Goals: Good ADL Goals Pt Will Perform Upper Body Bathing: with supervision;sitting Pt Will Perform Lower Body Bathing: with min guard assist;sit to/from stand Pt Will Perform Upper Body Dressing: with set-up;with supervision;sitting Pt Will Perform Lower Body Dressing: with set-up;with supervision;with adaptive equipment;sit to/from stand Pt Will Transfer to Toilet: with supervision;ambulating;bedside commode Pt Will Perform Toileting - Clothing Manipulation and hygiene: with supervision;sit to/from stand Additional ADL Goal #1: negotiate environment during ADL @ RW level @ S level  Visit Information  Last OT Received On: 10/31/12 Assistance Needed: +2 (for ambulation) History of Present Illness: Patient is a 79 yo female admitted with Lt sided weakness and slurred speech.  Patient with Rt. CVA and received tPA. Developed witnessed seizure, became hypoxic and required intubation.  Was extubated 10/27/12.  Patient with h/o pacemaker, A-fib, HTN.    Subjective Data      Prior Functioning       Cognition  Cognition Arousal/Alertness: Awake/alert Behavior During Therapy: Restless Overall Cognitive Status: Impaired/Different from baseline Area of Impairment: Attention;Memory;Safety/judgement;Awareness;Problem solving Current Attention Level: Sustained Memory: Decreased recall of precautions;Decreased short-term memory Safety/Judgement: Decreased awareness of safety;Decreased awareness of deficits Awareness: Emergent Problem Solving: Slow processing;Decreased initiation;Difficulty sequencing;Requires verbal cues General Comments: Using anchors for increased attention to L. Pt easily frustrated (daughter reports pt sleeping more than usual)     Mobility  Bed Mobility Bed Mobility: Rolling Left;Left Sidelying to Sit Rolling Left: 5: Supervision Left Sidelying to Sit: 5: Supervision Supine to Sit: HOB elevated Details for Bed Mobility Assistance: S. no cues needed Transfers Transfers: Sit to Stand;Stand to Sit Sit to Stand: 3: Mod assist;With upper extremity assist;From bed Stand to Sit: 3: Mod assist;With upper extremity assist;To chair/3-in-1 Details for Transfer Assistance: c/o painful knees during transfers    Exercises      Balance Static Standing Balance Static Standing - Balance Support: Bilateral upper extremity supported Static Standing - Level of Assistance: 4: Min assist   End of Session OT - End of Session Equipment Utilized During Treatment: Gait belt Activity Tolerance: Patient tolerated treatment well Patient left: in chair;with call bell/phone within reach;with family/visitor present Nurse Communication: Mobility status  GO     Dalonte Hardage,HILLARY 10/31/2012, 12:59 PM [facility], OTR/L  [phone] 10/31/2012

## 2012-11-01 ENCOUNTER — Inpatient Hospital Stay (HOSPITAL_COMMUNITY): Payer: Medicare Other | Admitting: Rehabilitation

## 2012-11-01 ENCOUNTER — Inpatient Hospital Stay (HOSPITAL_COMMUNITY): Payer: Medicare Other | Admitting: Occupational Therapy

## 2012-11-01 ENCOUNTER — Inpatient Hospital Stay (HOSPITAL_COMMUNITY): Payer: Medicare Other | Admitting: Speech Pathology

## 2012-11-01 ENCOUNTER — Inpatient Hospital Stay (HOSPITAL_COMMUNITY): Payer: Medicare Other

## 2012-11-01 DIAGNOSIS — I6330 Cerebral infarction due to thrombosis of unspecified cerebral artery: Secondary | ICD-10-CM

## 2012-11-01 LAB — URINE MICROSCOPIC-ADD ON

## 2012-11-01 LAB — GLUCOSE, CAPILLARY
Glucose-Capillary: 112 mg/dL — ABNORMAL HIGH (ref 70–99)
Glucose-Capillary: 168 mg/dL — ABNORMAL HIGH (ref 70–99)
Glucose-Capillary: 122 mg/dL — ABNORMAL HIGH (ref 70–99)
Glucose-Capillary: 128 mg/dL — ABNORMAL HIGH (ref 70–99)

## 2012-11-01 LAB — CBC WITH DIFFERENTIAL/PLATELET
Basophils Absolute: 0 K/uL (ref 0.0–0.1)
Basophils Relative: 0 % (ref 0–1)
Eosinophils Absolute: 0.2 K/uL (ref 0.0–0.7)
Eosinophils Relative: 1 % (ref 0–5)
HCT: 40.8 % (ref 36.0–46.0)
Hemoglobin: 14 g/dL (ref 12.0–15.0)
Lymphocytes Relative: 15 % (ref 12–46)
Lymphs Abs: 1.8 K/uL (ref 0.7–4.0)
MCH: 31 pg (ref 26.0–34.0)
MCHC: 34.3 g/dL (ref 30.0–36.0)
MCV: 90.5 fL (ref 78.0–100.0)
Monocytes Absolute: 1.3 K/uL — ABNORMAL HIGH (ref 0.1–1.0)
Monocytes Relative: 11 % (ref 3–12)
Neutro Abs: 8.5 K/uL — ABNORMAL HIGH (ref 1.7–7.7)
Neutrophils Relative %: 72 % (ref 43–77)
Platelets: 321 K/uL (ref 150–400)
RBC: 4.51 MIL/uL (ref 3.87–5.11)
RDW: 13.7 % (ref 11.5–15.5)
WBC: 11.7 K/uL — ABNORMAL HIGH (ref 4.0–10.5)

## 2012-11-01 LAB — URINALYSIS, ROUTINE W REFLEX MICROSCOPIC
Glucose, UA: NEGATIVE mg/dL
pH: 6.5 (ref 5.0–8.0)

## 2012-11-01 MED ORDER — FUROSEMIDE 40 MG PO TABS
40.0000 mg | ORAL_TABLET | Freq: Every day | ORAL | Status: DC
  Administered 2012-11-02 – 2012-11-08 (×7): 40 mg via ORAL
  Filled 2012-11-01 (×8): qty 1

## 2012-11-01 MED ORDER — TRAMADOL HCL 50 MG PO TABS: 25.0000 mg | ORAL_TABLET | Freq: Four times a day (QID) | ORAL | Status: DC | PRN

## 2012-11-01 MED ORDER — CIPROFLOXACIN HCL 250 MG PO TABS
250.0000 mg | ORAL_TABLET | Freq: Two times a day (BID) | ORAL | Status: AC
  Administered 2012-11-01 – 2012-11-05 (×9): 250 mg via ORAL
  Filled 2012-11-01 (×10): qty 1

## 2012-11-01 NOTE — Progress Notes (Signed)
Occupational Therapy Session Note  Patient Details  Name: [name] MRN: [id number] Date of Birth: 12-Aug-1918  Today's Date: 11/01/2012 Time: 4098-1191 Time Calculation (min): 27 min  Skilled Therapeutic Interventions/Progress Updates:   Pt performed toilet transfer and functional mobility during session.  Pt leans forward on the rollator walker and tends to prop on her elbows.  She reports a MD told her to do this and it helps to reduce the stress on her arthritic knees.  Discussed with daughter about pt's posture and actually had pt walk in a more upright position and place hands on the walker instead of her elbows.  She reported more pain in her knees.  Raised pt's walker as high as it would go to help increase upright posture while she walked.  Pt overall does not remember to lock the brakes before sitting down from standing and does not reach back for the chair and control her descend.  Feel she needs more upright posture with mobility but unsure if that will carry through to home.  Therapy Documentation Precautions:  Precautions Precautions: Fall Precaution Comments: L sided weakness and inattention Restrictions Weight Bearing Restrictions: No  Vital Signs: Therapy Vitals Temp: 98.9 F (37.2 C) Temp src: Oral Pulse Rate: 115 Resp: 18 Patient Position, if appropriate: Sitting Oxygen Therapy SpO2: 97 % O2 Device: None (Room air) Pain: Pain Assessment Pain Assessment: 0-10 Faces Pain Scale: Hurts a little bit Pain Type: Chronic pain Pain Location: Knee Pain Orientation: Left Pain Intervention(s): Medication (See eMAR);Repositioned Multiple Pain Sites: No ADL: See FIM for current functional status  Therapy/Group: Individual Therapy  Kadija Cruzen OTR/L 11/01/2012, 4:18 PM

## 2012-11-01 NOTE — Evaluation (Signed)
Speech Language Pathology Assessment and Plan  Patient Details  Name: [name] MRN: [id number] Date of Birth: 07/21/1918  SLP Diagnosis: Cognitive Impairments  Rehab Potential: Excellent ELOS: 2 weeks    Today's Date: 11/01/2012 Time: 0800-0900 Time Calculation (min): 60 min  Skilled Therapeutic Intervention: Administered cognitive-linguistic evaluation. Please see below for details. Educated pt and her daughter in regards to current cognitive function and goals of skilled SLP intervention. Both verbalized understanding.   Problem List:  Patient Active Problem List   Diagnosis Date Noted  . Cerebral embolism with cerebral infarction 10/26/2012  . Seizure 10/26/2012  . Acute respiratory failure 10/26/2012  . Intracranial hematoma 10/26/2012  . Acute combined systolic and diastolic congestive heart failure 10/12/2012  . Hyponatremia 10/12/2012  . A-fib 10/12/2012  . Diabetes 10/12/2012  . Hyperlipidemia 10/12/2012  . Atherosclerosis of native arteries of the extremities with gangrene 04/10/2011  . Swelling of limb 04/10/2011   Past Medical History:  Past Medical History  Diagnosis Date  . Diabetes mellitus   . Hypertension   . Hyperlipidemia   . Arthritis   . AF (atrial fibrillation)   . Cataract     decreased vision in left eye  . Peripheral vascular disease   . CHF (congestive heart failure)    Past Surgical History:  Past Surgical History  Procedure Laterality Date  . Pacemaker insertion  2008  . Cholecystectomy    . Thyroid surgery      Assessment / Plan / Recommendation Clinical Impression  Patient is a 79 y.o. year old female with history of DM, HTN, A Fib; who was admitted on 10/26/12 with left sided weakness and slurred speech. CT with question of early ischemic changes in right insula and patient treated with TPA. Patient with seizure episode with hypoxia later that evening requiring intubation. Started on Keppra. Follow up CT head revealed evolving  right MCA infarcts 4.6 X 3.8 cm. 2 D echo with EF 55-65% and no wall abnormality, moderate to severe AVR and MVR. Carotid dopplers difficult to visualize due to movement. Neurology recommends plavix for CVA prophylaxis--but to discuss NOAC with Dr. Eldridge Dace due to patient's history of A Fib. Patient had been taken off coumadin in 2012 due to history of falls and has had recent medication changes to help with A Fib control. Amiodarone increased to bid for rate control and Eliquis reccommended for CVA prophylaxis She was extubated without difficulty and tolerating regular diet. IV diuretic added for development of pleural effusions. Patient transferred to CIR on 10/31/2012 and presents with mild cognitive impairments characterized by decreased intellectual awareness of impairments, safety awareness, initiation, functional problem solving, attention to left field of environment/body and working memory. Pt would benefit from skilled SLP intervention to maximize cognitive function and overall functional independence prior to discharge home.     SLP Assessment  Patient will need skilled Speech Lanaguage Pathology Services during CIR admission    Recommendations  Recommendations for Other Services: Neuropsych consult Patient destination: Home Follow up Recommendations: Outpatient SLP;24 hour supervision/assistance Equipment Recommended: None recommended by SLP    SLP Frequency 5 out of 7 days   SLP Treatment/Interventions Cognitive remediation/compensation;Cueing hierarchy;Environmental controls;Functional tasks;Internal/external aids;Patient/family education;Therapeutic Activities    Pain Pain Assessment Pain Assessment: No/denies pain Prior Functioning Type of Home: House  Lives With: Son Available Help at Discharge: Family;Available 24 hours/day Vocation: Retired  Teacher, music Term Goals: Week 1: SLP Short Term Goal 1 (Week 1): Pt will demonstrate functional problem solving for mildly complex tasks with  supervision verbal cues. SLP Short Term Goal 2 (Week 1): Pt will attend to left field of enviornment with functional tasks with supervision verbal & question cues.  SLP Short Term Goal 3 (Week 1): Pt will utilize external memory aids to recall new, daily information with Mod I.  SLP Short Term Goal 4 (Week 1): Pt will identify 1 physical and 1 cognitive task with supervision question cues.  SLP Short Term Goal 5 (Week 1): Pt will initiate functional tasks with Mod I.   See FIM for current functional status Refer to Care Plan for Long Term Goals  Recommendations for other services: Neuropsych  Discharge Criteria: Patient will be discharged from SLP if patient refuses treatment 3 consecutive times without medical reason, if treatment goals not met, if there is a change in medical status, if patient makes no progress towards goals or if patient is discharged from hospital.  The above assessment, treatment plan, treatment alternatives and goals were discussed and mutually agreed upon: by patient and by family  [name] 11/01/2012, 11:02 AM

## 2012-11-01 NOTE — Care Management Note (Signed)
[facility] Individual Statement of Services  Patient Name:  [name]  Date:  11/01/2012  Welcome to the [facility].  Our goal is to provide you with an individualized program based on your diagnosis and situation, designed to meet your specific needs.  With this comprehensive rehabilitation program, you will be expected to participate in at least 3 hours of rehabilitation therapies Monday-Friday, with modified therapy programming on the weekends.  Your rehabilitation program will include the following services:  Physical Therapy (PT), Occupational Therapy (OT), 24 hour per day rehabilitation nursing,Speech Therapy ( SPT) , Case Management (Social Worker), Rehabilitation Medicine, Nutrition Services and Pharmacy Services  Weekly team conferences will be held on Wednesday to discuss your progress.  Your Social Worker will talk with you frequently to get your input and to update you on team discussions.  Team conferences with you and your family in attendance may also be held.  Expected length of stay: 12-14 days Overall anticipated outcome: Supervision/min level  Depending on your progress and recovery, your program may change. Your Social Worker will coordinate services and will keep you informed of any changes. Your Social Worker's name and contact numbers are listed  below.  The following services may also be recommended but are not provided by the [facility]:    Home Health Rehabiltiation Services  Outpatient Rehabilitation Services   Arrangements will be made to provide these services after discharge if needed.  Arrangements include referral to agencies that provide these services.  Your insurance has been verified to be:  [place] Your primary doctor is:  Dr [name]  Pertinent information will be shared with your doctor and your insurance company.  Social Worker:  Dossie Der, SW [phone] or [phone]  Information discussed with and copy given to patient by: Lucy Chris, 11/01/2012, 12:06 PM

## 2012-11-01 NOTE — IPOC Note (Signed)
Overall Plan of Care [facility]) Patient Details Name: [name] MRN: [id number] DOB: [birth year]  Admitting Diagnosis: RT CVA  Hospital Problems: Principal Problem:   Cerebral embolism with cerebral infarction Active Problems:   Acute combined systolic and diastolic congestive heart failure   A-fib   Diabetes   Seizure   Co-morbidities: AVR,MVR   Functional Problem List: Nursing Bladder;Bowel;Endurance;Medication Management;Nutrition;Pain;Safety;Sensory;Skin Integrity  PT Balance;Endurance;Motor;Pain;Safety;Perception  OT Balance;Behavior;Cognition;Endurance;Motor;Perception;Safety;Sensory;Vision  SLP Cognition  TR         Basic ADL's: OT Eating;Grooming;Bathing;Dressing;Toileting     Advanced  ADL's: OT Simple Meal Preparation     Transfers: PT Bed Mobility;Bed to Chair;Car  OT Toilet;Tub/Shower     Locomotion: PT Ambulation;Wheelchair Mobility     Additional Impairments: OT Fuctional Use of Upper Extremity  SLP Social Cognition   Memory;Problem Solving;Awareness  TR      Anticipated Outcomes Item Anticipated Outcome  Self Feeding independent  Swallowing      Basic self-care  supervision  Toileting  supervision   Bathroom Transfers min assist  Bowel/Bladder  continent of bowel and bladder with toileting modified independence  Transfers  supervision overall  Locomotion  min assist overall  Communication     Cognition  Supervision   Pain  3 or less on scale of 1-10  Safety/Judgment  superviison   Therapy Plan: PT Intensity: Minimum of 1-2 x/day ,45 to 90 minutes PT Frequency: 5 out of 7 days PT Duration Estimated Length of Stay: 12-14 days OT Intensity: Minimum of 1-2 x/day, 45 to 90 minutes OT Frequency: 5 out of 7 days OT Duration/Estimated Length of Stay: 12 to 14 days SLP Intensity: Minumum of 1-2 x/day, 30 to 90 minutes SLP Frequency: 5 out of 7 days SLP Duration/Estimated Length of Stay: 2 weeks        Team  Interventions: Nursing Interventions Patient/Family Education;Bladder Management;Bowel Management;Disease Management/Prevention;Pain Management;Medication Management;Skin Care/Wound Management;Discharge Planning;Psychosocial Support  PT interventions Ambulation/gait training;Discharge planning;DME/adaptive equipment instruction;Functional mobility training;Neuromuscular re-education;Pain management;Patient/family education;Therapeutic Activities;Therapeutic Exercise;UE/LE Strength taining/ROM;UE/LE Coordination activities;Visual/perceptual remediation/compensation;Wheelchair propulsion/positioning;Balance/vestibular training  OT Interventions Balance/vestibular training;Cognitive remediation/compensation;Community reintegration;Discharge planning;Functional mobility training;DME/adaptive equipment instruction;Neuromuscular re-education;Pain management;Patient/family education;Psychosocial support;Therapeutic Activities;Self Care/advanced ADL retraining;Therapeutic Exercise;UE/LE Strength taining/ROM;UE/LE Coordination activities;Visual/perceptual remediation/compensation  SLP Interventions Cognitive remediation/compensation;Cueing hierarchy;Environmental controls;Functional tasks;Internal/external aids;Patient/family education;Therapeutic Activities  TR Interventions    SW/CM Interventions Discharge Planning;Psychosocial Support;Patient/Family Education    Team Discharge Planning: Destination: PT-Home ,OT- Home , SLP-Home Projected Follow-up: PT-Home health PT;24 hour supervision/assistance, OT-  Home health OT, SLP-Outpatient SLP;24 hour supervision/assistance Projected Equipment Needs: PT-Rolling walker with 5" wheels, OT- 3 in 1 bedside comode, SLP-None recommended by SLP Patient/family involved in discharge planning: PT- Patient;Family member/caregiver,  OT-Patient;Family member/caregiver, SLP-Patient;Family member/caregiver  MD ELOS: 2 wks Medical Rehab Prognosis:  Good Assessment: 79 y.o.  female with history of DM, HTN, A Fib; who was admitted on 10/26/12 with left sided weakness and slurred speech. CT with question of early ischemic changes in right insula and patient treated with TPA. Patient with seizure episode with hypoxia later that evening requiring intubation. Started on Keppra. Follow up CT head revealed evolving right MCA infarcts 4.6 X 3.8 cm. 2 D echo with EF 55-65% and no wall abnormality, moderate to severe AVR and MVR  Now requiring 24/7 Rehab RN,MD, as well as CIR level PT, OT and SLP.  Treatment team will focus on ADLs and mobility with goals set at Supervision   See Team Conference Notes for weekly updates to the plan of care

## 2012-11-01 NOTE — Evaluation (Signed)
Physical Therapy Assessment and Plan  Patient Details  Name: [name] MRN: [id number] Date of Birth: 1918/11/30  PT Diagnosis: Abnormal posture, Abnormality of gait, Coordination disorder, Difficulty walking, Hemiparesis non-dominant, Impaired cognition, Muscle weakness and Pain in joint Rehab Potential: Good ELOS: 12-14 days   Today's Date: 11/01/2012 Time: 1478-29562 Time Calculation (min): 45 min  Problem List:  Patient Active Problem List   Diagnosis Date Noted  . Cerebral embolism with cerebral infarction 10/26/2012  . Seizure 10/26/2012  . Acute respiratory failure 10/26/2012  . Intracranial hematoma 10/26/2012  . Acute combined systolic and diastolic congestive heart failure 10/12/2012  . Hyponatremia 10/12/2012  . A-fib 10/12/2012  . Diabetes 10/12/2012  . Hyperlipidemia 10/12/2012  . Atherosclerosis of native arteries of the extremities with gangrene 04/10/2011  . Swelling of limb 04/10/2011    Past Medical History:  Past Medical History  Diagnosis Date  . Diabetes mellitus   . Hypertension   . Hyperlipidemia   . Arthritis   . AF (atrial fibrillation)   . Cataract     decreased vision in left eye  . Peripheral vascular disease   . CHF (congestive heart failure)    Past Surgical History:  Past Surgical History  Procedure Laterality Date  . Pacemaker insertion  2008  . Cholecystectomy    . Thyroid surgery      Assessment & Plan Clinical Impression: Patient is a 79 y.o. year old female with history of DM, HTN, A Fib; who was admitted on 10/26/12 with left sided weakness and slurred speech. CT with question of early ischemic changes in right insula and patient treated with TPA. Patient with seizure episode with hypoxia later that evening requiring intubation. Started on Keppra. Follow up CT head revealed evolving right MCA infarcts 4.6 X 3.8 cm. 2 D echo with EF 55-65% and no wall abnormality, moderate to severe AVR and MVR. Carotid dopplers difficult to  visualize due to movement. Neurology recommends plavix for CVA prophylaxis--but to discuss NOAC with Dr. Eldridge Dace due to patient's history of A Fib. Patient had been taken off coumadin in 2012 due to history of falls and has had recent medication changes to help with A Fib control. Amiodarone increased to bid for rate control and Eliquis reccommended for CVA prophylaxis She was extubated without difficulty and tolerating regular diet. IV diuretic added for development of pleural effusions.  Patient transferred to CIR on 10/31/2012 .   Patient currently requires max with mobility and +2 for ambulation for safety secondary to muscle weakness, impaired timing and sequencing, decreased coordination and decreased motor planning and decreased attention to left and left side neglect.  Prior to hospitalization, patient was modified independent  with mobility and lived with Son in a House home.  Home access is  Ramped entrance (Son to get ramp installed).  Patient will benefit from skilled PT intervention to maximize safe functional mobility, minimize fall risk and decrease caregiver burden for planned discharge home with 24 hour assist.  Anticipate patient will benefit from follow up [facility] at discharge.  PT - End of Session Activity Tolerance: Tolerates 10 - 20 min activity with multiple rests Endurance Deficit: Yes Endurance Deficit Description: Pt requires several rest breaks following tasks during session today.  PT Assessment Rehab Potential: Good Barriers to Discharge: Decreased caregiver support Barriers to Discharge Comments: Pts daughter states that son works and Careers information officer in [employer] worker babysits and would need to leave for a few hours a day, however other daughter states she could stay in  town to provide 24/7 if needed.  PT Patient demonstrates impairments in the following area(s): Balance;Endurance;Motor;Pain;Safety;Perception PT Transfers Functional Problem(s): Bed Mobility;Bed to Chair;Car PT Locomotion  Functional Problem(s): Ambulation;Wheelchair Mobility PT Plan PT Intensity: Minimum of 1-2 x/day ,45 to 90 minutes PT Frequency: 5 out of 7 days PT Duration Estimated Length of Stay: 12-14 days PT Treatment/Interventions: Ambulation/gait training;Discharge planning;DME/adaptive equipment instruction;Functional mobility training;Neuromuscular re-education;Pain management;Patient/family education;Therapeutic Activities;Therapeutic Exercise;UE/LE Strength taining/ROM;UE/LE Coordination activities;Visual/perceptual remediation/compensation;Wheelchair propulsion/positioning;Balance/vestibular training PT Transfers Anticipated Outcome(s): supervision overall PT Locomotion Anticipated Outcome(s): min assist overall PT Recommendation Follow Up Recommendations: Home health PT;24 hour supervision/assistance Patient destination: Home Equipment Recommended: Rolling walker with 5" wheels  Skilled Therapeutic Intervention Performed bed mobility x 2 reps (supine <> sit) at supervision with min cues on second attempt for hand placement and safety.  Pt states she needs to use restroom upon PT entering room, therefore assisted to 3in1 at bedside via stand pivot transfer at [facility] assist level with cues for weight shifting and advancing LEs for proper placement at commode before sitting.  Pt very agitated during session yelling out inappropriate language and also stating "I want to die."  Pts daughter present and states that this is normal behavior for pt and that she loses her temper very easily.  Empathized with pt and reassured her that she is here due to potential for improvement.  Gait training x 12' x 1 rep using +2 assist via "three muskateer style."  Requires verbal and manual facilitation for upright posture, increased weight shift to left for improved advancement of RLE.  Also cues for sequencing and increased step width.  Pt unable to fully complete turn to chair and states "I'm going to fall."  Cues for  attaining upright posture for chair to be pulled up behind her.  Left in w/c in gym for OT eval following PT session.    PT Evaluation Precautions/Restrictions Precautions Precautions: Fall Precaution Comments: L sided weakness and inattention Restrictions Weight Bearing Restrictions: No General Chart Reviewed: Yes Amount of Missed PT Time (min): 15 Minutes Missed Time Reason: X-Ray Family/Caregiver Present: Yes Vital SignsTherapy Vitals BP: 110/68 mmHg Patient Position, if appropriate: Sitting Pain:  Pt with continuous c/o pain in B knees, however this is chronic condition as B knees have severe arthritis.  Allowed several rest breaks during activity.    Home Living/Prior Functioning Home Living Available Help at Discharge: Family;Available 24 hours/day Type of Home: House Home Access: Ramped entrance (Son to get ramp installed) Home Layout: One level Additional Comments: Pt would like to return home if possible, however discussed with daughter that depending on progress with mobility and cognition she may need more 24/7 care at D/C and then would D/C home with son (would be able to provide mostly 24/7).  Daughter is also in town and could provide 24/7 assist if needed.     Lives With: Son Prior Function Vocation: Retired Vision/Perception   Pt with L sided inattention/neglect during session.  See OT assessment for details.   Cognition Overall Cognitive Status: Impaired/Different from baseline Arousal/Alertness: Awake/alert Orientation Level: Oriented X4 Attention: Focused;Sustained Focused Attention: Appears intact Sustained Attention: Appears intact Memory: Impaired Memory Impairment: Decreased recall of new information Awareness: Impaired Awareness Impairment: Emergent impairment Problem Solving: Impaired Problem Solving Impairment: Functional basic;Functional complex Executive Function: Reasoning;Organizing;Self Monitoring;Self Correcting Reasoning:  Impaired Reasoning Impairment: Functional complex Organizing: Impaired Organizing Impairment: Functional complex Self Monitoring: Impaired Self Monitoring Impairment: Functional complex Self Correcting: Impaired Self Correcting Impairment: Functional complex Behaviors: Poor frustration tolerance  Safety/Judgment: Impaired Comments: Pt attempts to sit unsafely following transfers and ambulation during session.   Requires max cues for hand placement, weight shifting to turn in order to sit properly and safely in chair.  Sensation Sensation Light Touch: Appears Intact Stereognosis: Not tested Hot/Cold: Not tested Proprioception: Appears Intact Coordination Gross Motor Movements are Fluid and Coordinated: No Coordination and Movement Description: Pt has increased difficulty coordinating reciprical movements of LLE when ambulating.  Heel Shin Test: decreased smooth movements, however could be partially due to pain and weakness from B knee OA Motor  Motor Motor: Abnormal postural alignment and control;Hemiplegia Motor - Skilled Clinical Observations: Pt with increased weakness on LLE, inattention/neglect to L side, poor postural control in sitting  Mobility Bed Mobility Bed Mobility: Sit to Supine;Rolling Left;Left Sidelying to Sit;Sitting - Scoot to [place] of Bed Rolling Left: 5: Supervision Rolling Left Details: Verbal cues for technique;Verbal cues for precautions/safety Left Sidelying to Sit: 5: Supervision Left Sidelying to Sit Details: Verbal cues for technique;Verbal cues for precautions/safety Sitting - Scoot to Edge of Bed: 5: Supervision Sitting - Scoot to Edge of Bed Details: Verbal cues for sequencing;Verbal cues for technique;Verbal cues for precautions/safety Sit to Supine: 5: Supervision;HOB flat Sit to Supine - Details: Verbal cues for precautions/safety;Verbal cues for technique;Verbal cues for sequencing Transfers Transfers: Yes Sit to Stand: 3: Mod assist;With upper  extremity assist;From bed;From chair/3-in-1 Sit to Stand Details: Manual facilitation for weight bearing;Manual facilitation for weight shifting;Verbal cues for technique;Verbal cues for sequencing Stand to Sit: 3: Mod assist;With upper extremity assist;To chair/3-in-1;To bed Stand to Sit Details (indicate cue type and reason): Manual facilitation for weight shifting;Verbal cues for technique;Verbal cues for sequencing;Verbal cues for precautions/safety Stand Pivot Transfers: 3: Mod assist;2: Max assist Stand Pivot Transfer Details: Manual facilitation for weight bearing;Manual facilitation for weight shifting;Verbal cues for technique;Verbal cues for sequencing;Verbal cues for precautions/safety;Manual facilitation for placement Locomotion  Ambulation Ambulation: Yes Ambulation/Gait Assistance: 1: +2 Total assist Ambulation Distance (Feet): 12 Feet Assistive device:  ("three muskateer style") Ambulation/Gait Assistance Details: Verbal cues for technique;Verbal cues for precautions/safety;Verbal cues for sequencing;Manual facilitation for weight shifting;Manual facilitation for weight bearing;Verbal cues for gait pattern;Manual facilitation for placement Ambulation/Gait Assistance Details: Pt did well with "three muskateer style" during ambulation and was noted to have increased ability to maintain upright posture.  she continues to have increased pain in B knees due to arthritis.  Did note that LLE has somewhat decreased activation during ambulation.   Gait Gait: No Gait Pattern: Step-to pattern;Decreased weight shift to left;Shuffle;Trunk flexed;Narrow base of support  Trunk/Postural Assessment  Postural Control Postural Control: Deficits on evaluation Postural Limitations: Pt with very kyphotic posture with forward head.  Pts daughter states that this is the way she has walked for many years.    Balance Balance Balance Assessed: Yes Static Sitting Balance Static Sitting - Balance Support:  Feet supported;Right upper extremity supported Static Sitting - Level of Assistance: 5: Stand by assistance Static Sitting - Comment/# of Minutes: Pt able to sit at EOB x 2-3 mins with single UE support at stand by assist for set up of 3in1 by bedside.  Extremity Assessment      RLE Assessment RLE Assessment: Exceptions to [facility] RLE Strength RLE Overall Strength: Deficits;Due to pain;Due to premorbid status RLE Overall Strength Comments: hip flex 3+ to 4/5, knee flex 4/5, knee ext 3+/5 (increased pain), ankle DF 4/5, PF 5/5 LLE Assessment LLE Assessment: Exceptions to [facility] LLE Strength LLE Overall Strength: Deficits;Due to pain;Due to  premorbid status LLE Overall Strength Comments: hip flex 3/5, knee flex 3+/5, knee ext 3+/5 (increased pain), ankle DF 4/5, ankle PF 5/5  FIM:  FIM - Bed/Chair Transfer Bed/Chair Transfer Assistive Devices: Arm rests;Bed rails Bed/Chair Transfer: 5: Supine > Sit: Supervision (verbal cues/safety issues);5: Sit > Supine: Supervision (verbal cues/safety issues);3: Bed > Chair or W/C: Mod A (lift or lower assist);3: Chair or W/C > Bed: Mod A (lift or lower assist) FIM - Locomotion: Wheelchair Locomotion: Wheelchair: 1: Total Assistance/staff pushes wheelchair (Pt<25%) FIM - Locomotion: Ambulation Locomotion: Ambulation Assistive Devices:  (+2 (three muskateer style)) Ambulation/Gait Assistance: 1: +2 Total assist Locomotion: Ambulation: 1: Two helpers FIM - Locomotion: Stairs Locomotion: Stairs: 0: Activity did not occur   Refer to Care Plan for Long Term Goals  Recommendations for other services: None  Discharge Criteria: Patient will be discharged from PT if patient refuses treatment 3 consecutive times without medical reason, if treatment goals not met, if there is a change in medical status, if patient makes no progress towards goals or if patient is discharged from hospital.  The above assessment, treatment plan, treatment alternatives and goals were  discussed and mutually agreed upon: by patient and by family  Vista Deck 11/01/2012, 12:23 PM

## 2012-11-01 NOTE — Progress Notes (Signed)
Patient ID: [name], female   DOB: 11-15-1918, 79 y.o.   MRN: [id number] Subjective/Complaints: 79 y.o. female with history of DM, HTN, A Fib; who was admitted on 10/26/12 with left sided weakness and slurred speech. CT with question of early ischemic changes in right insula and patient treated with TPA. Patient with seizure episode with hypoxia later that evening requiring intubation. Started on Keppra. Follow up CT head revealed evolving right MCA infarcts 4.6 X 3.8 cm. 2 D echo with EF 55-65% and no wall abnormality, moderate to severe AVR and MVR. Carotid dopplers difficult to visualize due to movement. Neurology recommends plavix for CVA prophylaxis--but to discuss NOAC with Dr. Eldridge Dace due to patient's history of Afib   No problems overnite  Review of Systems  HENT: Positive for hearing loss.   All other systems reviewed and are negative.   Objective: Vital Signs: Blood pressure 106/56, pulse 93, temperature 98.4 F (36.9 C), temperature source Oral, resp. rate 18, height 5\' 2"  (1.575 m), weight 84.6 kg (186 lb 8.2 oz), SpO2 96.00%. No results found. Results for orders placed during the hospital encounter of 10/31/12 (from the past 72 hour(s))  GLUCOSE, CAPILLARY     Status: Abnormal   Collection Time    10/31/12  5:04 PM      Result Value Range   Glucose-Capillary 141 (*) 70 - 99 mg/dL  COMPREHENSIVE METABOLIC PANEL     Status: Abnormal   Collection Time    10/31/12  7:20 PM      Result Value Range   Sodium 133 (*) 135 - 145 mEq/L   Potassium 3.6  3.5 - 5.1 mEq/L   Chloride 95 (*) 96 - 112 mEq/L   CO2 27  19 - 32 mEq/L   Glucose, Bld 170 (*) 70 - 99 mg/dL   BUN 14  6 - 23 mg/dL   Creatinine, Ser 4.09  0.50 - 1.10 mg/dL   Calcium 7.9 (*) 8.4 - 10.5 mg/dL   Total Protein 5.6 (*) 6.0 - 8.3 g/dL   Albumin 2.5 (*) 3.5 - 5.2 g/dL   AST 19  0 - 37 U/L   ALT 12  0 - 35 U/L   Alkaline Phosphatase 55  39 - 117 U/L   Total Bilirubin 1.0  0.3 - 1.2 mg/dL   GFR calc non Af  Amer 62 (*) >90 mL/min   GFR calc Af Amer 71 (*) >90 mL/min   Comment: (NOTE)     The eGFR has been calculated using the CKD EPI equation.     This calculation has not been validated in all clinical situations.     eGFR's persistently <90 mL/min signify possible Chronic Kidney     Disease.  GLUCOSE, CAPILLARY     Status: Abnormal   Collection Time    10/31/12  8:45 PM      Result Value Range   Glucose-Capillary 136 (*) 70 - 99 mg/dL  CBC WITH DIFFERENTIAL     Status: Abnormal   Collection Time    11/01/12  6:25 AM      Result Value Range   WBC 11.7 (*) 4.0 - 10.5 K/uL   RBC 4.51  3.87 - 5.11 MIL/uL   Hemoglobin 14.0  12.0 - 15.0 g/dL   HCT 81.1  91.4 - 78.2 %   MCV 90.5  78.0 - 100.0 fL   MCH 31.0  26.0 - 34.0 pg   MCHC 34.3  30.0 - 36.0 g/dL   RDW 13.7  11.5 - 15.5 %   Platelets 321  150 - 400 K/uL   Neutrophils Relative % 72  43 - 77 %   Neutro Abs 8.5 (*) 1.7 - 7.7 K/uL   Lymphocytes Relative 15  12 - 46 %   Lymphs Abs 1.8  0.7 - 4.0 K/uL   Monocytes Relative 11  3 - 12 %   Monocytes Absolute 1.3 (*) 0.1 - 1.0 K/uL   Eosinophils Relative 1  0 - 5 %   Eosinophils Absolute 0.2  0.0 - 0.7 K/uL   Basophils Relative 0  0 - 1 %   Basophils Absolute 0.0  0.0 - 0.1 K/uL  GLUCOSE, CAPILLARY     Status: Abnormal   Collection Time    11/01/12  7:07 AM      Result Value Range   Glucose-Capillary 112 (*) 70 - 99 mg/dL   Comment 1 Notify RN        Nursing note and vitals reviewed.  Constitutional: She is oriented to person, place, and time. She appears well-developed and well-nourished.  HENT:  Head: Normocephalic and atraumatic.  Eyes: Conjunctivae are normal. Pupils are equal, round, and reactive to light.  Neck: Normal range of motion. Neck supple.  Cardiovascular: Normal rate. An irregularly irregular rhythm present.  Pulmonary/Chest: No respiratory distress. She has decreased breath sounds in the right lower field and the left lower field. She has no wheezes.  Abdominal:  Soft. Bowel sounds are normal. She exhibits no distension. There is no tenderness.  Musculoskeletal: She exhibits no edema.  Discomfort and decreased ROM bilateral knees  Neurological: She is alert and oriented to person, place, and time.  Follows commands without difficulty. Speech clear. LUE with pronator drift. Left sided weakness, 3/5 Delt, bi, tri, grip 4/5 L HF KE, 3-/5 ADF/PF , effort inconsistent however. Has grossly intact sense to pain and light touch. 4/5 R side except 3- HF Cognitively fairly intact. Is very HOH  Skin: Skin is warm and dry. No erythema.  Diffuse ecchymosis left forearm and petechial area on BLE.  Psychiatric: She has a normal mood and affect. Her behavior is normal. Thought content normal.    Assessment/Plan: 1. Functional deficits secondary to R MCA infarct which require 3+ hours per day of interdisciplinary therapy in a comprehensive inpatient rehab setting. Physiatrist is providing close team supervision and 24 hour management of active medical problems listed below. Physiatrist and rehab team continue to assess barriers to discharge/monitor patient progress toward functional and medical goals. FIM: FIM - Bathing Bathing: 0: Activity did not occur  FIM - Upper Body Dressing/Undressing Upper body dressing/undressing: 0: Wears gown/pajamas-no public clothing FIM - Lower Body Dressing/Undressing Lower body dressing/undressing: 0: Wears gown/pajamas-no public clothing  FIM - Toileting Toileting: 0: Activity did not occur  FIM - Archivist Transfers: 0-Activity did not occur  FIM - Banker Devices: Arm rests Bed/Chair Transfer: 1: Two helpers  FIM - Locomotion: Wheelchair Locomotion: Wheelchair: 0: Activity did not occur FIM - Locomotion: Ambulation Locomotion: Ambulation: 0: Activity did not occur  Comprehension Comprehension Mode: Auditory Comprehension: 5-Set-up assist with hearing  device  Expression Expression Mode: Verbal Expression: 6-Expresses complex ideas: With extra time/assistive device  Social Interaction Social Interaction: 7-Interacts appropriately with others - No medications needed.  Problem Solving Problem Solving: 5-Solves basic 90% of the time/requires cueing < 10% of the time  Memory Memory: 5-Recognizes or recalls 90% of the time/requires cueing < 10% of the time  Medical Problem List and Plan:  1. DVT Prophylaxis/Anticoagulation: Pharmaceutical: Other (comment)--Eliquis  2. Pain Management: Will add voltaren gel to help with chronic knee problems  3. Mood: Provide ego support regarding life changes. Patient would like to be able to go back to her own home. Will have LCSW follow for evaluation and support.  4. Neuropsych: This patient is capable of making decisions on her own behalf.  5. A trial Fibrillation: Monitor HR with bid checks. Continue amiodarone 400 mg bid with metoprolol 25 mg bid.  6. Hyponatremia/ Hypokalemia: Has been getting IV lasix on bid basis. Will add K+ supplement. Will follow up on electrolyte abnormalities in am.  8. DM type 2: Monitor BS with ac/hs cbg checks. Resume metformin. Use SSI for elevated BS.  9. Chronic mixed CHF: Recent admission for overload 08/15-08/20/14. Now with Left > Right pleural effusions--- continue IV lasix and recheck CXR in am. Will change to po if improved. Check daily weights. Low salt diet.   LOS (Days) 1 A FACE TO FACE EVALUATION WAS PERFORMED  Timmie Dugue E 11/01/2012, 8:19 AM

## 2012-11-01 NOTE — Evaluation (Signed)
Occupational Therapy Assessment and Plan  Patient Details  Name: [name] MRN: [id number] Date of Birth: [birth year]  OT Diagnosis: abnormal posture, acute pain, cognitive deficits, hemiplegia affecting non-dominant side and muscle weakness (generalized) Rehab Potential: Rehab Potential: Good ELOS: 12 to 14 days   Today's Date: 11/01/2012 Time: 4098-1191 Time Calculation (min): 71 min  Problem List:  Patient Active Problem List   Diagnosis Date Noted  . Cerebral embolism with cerebral infarction 10/26/2012  . Seizure 10/26/2012  . Acute respiratory failure 10/26/2012  . Intracranial hematoma 10/26/2012  . Acute combined systolic and diastolic congestive heart failure 10/12/2012  . Hyponatremia 10/12/2012  . A-fib 10/12/2012  . Diabetes 10/12/2012  . Hyperlipidemia 10/12/2012  . Atherosclerosis of native arteries of the extremities with gangrene 04/10/2011  . Swelling of limb 04/10/2011    Past Medical History:  Past Medical History  Diagnosis Date  . Diabetes mellitus   . Hypertension   . Hyperlipidemia   . Arthritis   . AF (atrial fibrillation)   . Cataract     decreased vision in left eye  . Peripheral vascular disease   . CHF (congestive heart failure)    Past Surgical History:  Past Surgical History  Procedure Laterality Date  . Pacemaker insertion  2008  . Cholecystectomy    . Thyroid surgery      Assessment & Plan Clinical Impression: Patient is a 79 y.o. year old female with recent admission to the hospital on 10/26/12 with left sided weakness and slurred speech. CT with question of early ischemic changes in right insula and patient treated with TPA. Patient with seizure episode with hypoxia later that evening requiring intubation. Started on Keppra. Follow up CT head revealed evolving right MCA infarcts 4.6 X 3.8 cm.  Patient transferred to CIR on 10/31/2012 .    Patient currently requires mod with basic self-care skills secondary to muscle weakness,  impaired timing and sequencing and decreased coordination, decreased attention to left, left side neglect and decreased motor planning and decreased awareness, decreased safety awareness and decreased memory.  Prior to hospitalization, patient could complete ADLswith modified independent .  Patient will benefit from skilled intervention to decrease level of assist with basic self-care skills and increase independence with basic self-care skills prior to discharge home with her son and daughter.  Anticipate patient will require 24 hour supervision and minimal physical assistance and follow up home health.  OT - End of Session Activity Tolerance: Tolerates 30+ min activity with multiple rests Endurance Deficit: Yes Endurance Deficit Description: Pt needing rest breaks during OT session. OT Assessment Rehab Potential: Good Barriers to Discharge: Decreased caregiver support OT Patient demonstrates impairments in the following area(s): Balance;Behavior;Cognition;Endurance;Motor;Perception;Safety;Sensory;Vision OT Basic ADL's Functional Problem(s): Eating;Grooming;Bathing;Dressing;Toileting OT Advanced ADL's Functional Problem(s): Simple Meal Preparation OT Transfers Functional Problem(s): Toilet;Tub/Shower OT Additional Impairment(s): Fuctional Use of Upper Extremity OT Plan OT Intensity: Minimum of 1-2 x/day, 45 to 90 minutes OT Frequency: 5 out of 7 days OT Duration/Estimated Length of Stay: 12 to 14 days OT Treatment/Interventions: Balance/vestibular training;Cognitive remediation/compensation;Community reintegration;Discharge planning;Functional mobility training;DME/adaptive equipment instruction;Neuromuscular re-education;Pain management;Patient/family education;Psychosocial support;Therapeutic Activities;Self Care/advanced ADL retraining;Therapeutic Exercise;UE/LE Strength taining/ROM;UE/LE Coordination activities;Visual/perceptual remediation/compensation OT Self Feeding Anticipated  Outcome(s): independent OT Basic Self-Care Anticipated Outcome(s): supervision OT Toileting Anticipated Outcome(s): supervision OT Bathroom Transfers Anticipated Outcome(s): min assist OT Recommendation Patient destination: Home Follow Up Recommendations: Home health OT Equipment Recommended: 3 in 1 bedside comode  OT Evaluation Precautions/Restrictions  Precautions Precautions: Fall Precaution Comments: L sided weakness and inattention Restrictions  Weight Bearing Restrictions: No  Vital Signs Therapy Vitals BP: 110/68 mmHg Patient Position, if appropriate: Sitting Pain Pain Assessment Pain Assessment: Faces Faces Pain Scale: Hurts a little bit Pain Type: Chronic pain Pain Location: Knee Pain Orientation: Left Pain Intervention(s): Repositioned;Emotional support Home Living/Prior Functioning Home Living Living Arrangements: Alone Available Help at Discharge: Family;Available 24 hours/day Type of Home: House Home Access: Ramped entrance (Son to get ramp installed) Home Layout: One level Additional Comments: Pt would like to return home if possible, however discussed with daughter that depending on progress with mobility and cognition she may need more 24/7 care at D/C and then would D/C home with son (would be able to provide mostly 24/7).  Daughter is also in town and could provide 24/7 assist if needed.     Lives With: Son Prior Function Vocation: Retired ADL  See FIM scale  Vision/Perception  Vision - History Baseline Vision: Wears glasses only for reading Visual History: Cataracts;Corrective eye surgery;Other (comment) Patient Visual Report: Peripheral vision impairment Vision - Assessment Eye Alignment: Within Functional Limits Vision Assessment: Vision tested Ocular Range of Motion: Within Functional Limits Alignment/Gaze Preference: Within Defined Limits Tracking/Visual Pursuits: Decreased smoothness of horizontal tracking;Decreased smoothness of vertical  tracking Visual Fields: Left homonymous hemianopsia;Impaired - to be further tested in functional context Perception Perception: Impaired Inattention/Neglect: Does not attend to left visual field Spatial Orientation: visual spatial dysfunction Praxis Praxis: Impaired Praxis Impairment Details: Motor planning Praxis-Other Comments: Pt demonstrated difficulty determining the orientation of clothing as well as the sequence she used for donning a bra.  Cognition Overall Cognitive Status: Impaired/Different from baseline Arousal/Alertness: Awake/alert Orientation Level: Oriented X4 Attention: Focused;Sustained Focused Attention: Appears intact Sustained Attention: Appears intact Memory: Impaired Memory Impairment: Decreased recall of new information;Decreased short term memory Awareness: Impaired Awareness Impairment: Intellectual impairment Problem Solving: Impaired Problem Solving Impairment: Functional basic Executive Function: Reasoning;Organizing;Self Monitoring;Self Correcting Reasoning: Impaired Reasoning Impairment: Functional basic Organizing: Impaired Organizing Impairment: Functional complex Self Monitoring: Impaired Self Monitoring Impairment: Functional complex Self Correcting: Impaired Self Correcting Impairment: Functional complex Behaviors: Poor frustration tolerance Safety/Judgment: Impaired Comments: Pt reported that she had no real deficits from the stroke. Sensation Sensation Light Touch: Appears Intact Stereognosis: Not tested Hot/Cold: Not tested Proprioception: Impaired Detail Proprioception Impaired Details: Impaired LUE Additional Comments: Decreased proprioception in the left wrist. Coordination Gross Motor Movements are Fluid and Coordinated: Yes Fine Motor Movements are Fluid and Coordinated: No Coordination and Movement Description: Pt demonstrated difficulty when attempting to fasten her bra.  Motor  Motor Motor: Abnormal postural alignment  and control;Hemiplegia Motor - Skilled Clinical Observations: Pt with flexed posture in standing. Mobility  Transfers Transfers: Sit to Stand;Stand to Sit Sit to Stand: 3: Mod assist;With upper extremity assist;From chair/3-in-1 Sit to Stand Details: Manual facilitation for weight shifting;Verbal cues for technique Stand to Sit: 3: Mod assist;With upper extremity assist;To chair/3-in-1 Stand to Sit Details (indicate cue type and reason): Manual facilitation for weight shifting;Verbal cues for technique  Trunk/Postural Assessment  Cervical Assessment Cervical Assessment: Within Functional Limits Thoracic Assessment Thoracic Assessment: Within Functional Limits Lumbar Assessment Lumbar Assessment: Exceptions to [facility] Lumbar Strength Overall Lumbar Strength Comments: Pt with lumbar kyphosis in standing. Postural Control Postural Control: Deficits on evaluation Postural Limitations: Pt with very kyphotic posture with forward head.  Pts daughter states that this is the way she has walked for many years.    Balance Balance Balance Assessed: Yes Static Sitting Balance Static Sitting - Balance Support: Right upper extremity supported;Left upper extremity supported Static  Sitting - Level of Assistance: 5: Stand by assistance Static Sitting - Comment/# of Minutes: Pt able to sit at EOB x 2-3 mins with single UE support at stand by assist for set up of 3in1 by bedside.  Static Standing Balance Static Standing - Balance Support: Right upper extremity supported;Left upper extremity supported Static Standing - Level of Assistance: 4: Min assist Extremity/Trunk Assessment RUE Assessment RUE Assessment: Within Functional Limits (strength overall 4/5 in the shoulder and elbow, grip 3+/5) LUE Assessment LUE Assessment: Exceptions to WFL LUE AROM (degrees) LUE Overall AROM Comments: AROM shoulder flexion 0-130 degrees, shoulder strength 3+/5, AROM WFLS for all other joints including hand and elbow.   Strength 3+/5 throughout.    FIM:  FIM - Eating Eating Activity: 5: Set-up assist for open containers FIM - Grooming Grooming Steps: Wash, rinse, dry face;Wash, rinse, dry hands Grooming: 5: Supervision: safety issues or verbal cues FIM - Bathing Bathing Steps Patient Completed: Chest;Right Arm;Left Arm;Abdomen;Front perineal area;Right upper leg;Left upper leg Bathing: 3: Mod-Patient completes 5-7 43f 10 parts or 50-74% FIM - Upper Body Dressing/Undressing Upper body dressing/undressing steps patient completed: Thread/unthread right bra strap;Thread/unthread left sleeve of pullover shirt/dress;Put head through opening of pull over shirt/dress;Thread/unthread right sleeve of pullover shirt/dresss Upper body dressing/undressing: 3: Mod-Patient completed 50-74% of tasks FIM - Lower Body Dressing/Undressing Lower body dressing/undressing steps patient completed: Thread/unthread right underwear leg;Thread/unthread left underwear leg;Thread/unthread right pants leg;Thread/unthread left pants leg;Pull underwear up/down;Pull pants up/down Lower body dressing/undressing: 3: Mod-Patient completed 50-74% of tasks FIM - Banker Devices: Arm rests;Bed rails Bed/Chair Transfer: 5: Supine > Sit: Supervision (verbal cues/safety issues);5: Sit > Supine: Supervision (verbal cues/safety issues);3: Bed > Chair or W/C: Mod A (lift or lower assist);3: Chair or W/C > Bed: Mod A (lift or lower assist) FIM - Diplomatic Services operational officer Devices: Bedside commode Toilet Transfers: 3-To toilet/BSC: Mod A (lift or lower assist);3-From toilet/BSC: Mod A (lift or lower assist) FIM - Tub/Shower Transfers Tub/shower Transfers: 0-Activity did not occur or was simulated   Refer to Care Plan for Long Term Goals  Recommendations for other services: None  Discharge Criteria: Patient will be discharged from OT if patient refuses treatment 3 consecutive times without  medical reason, if treatment goals not met, if there is a change in medical status, if patient makes no progress towards goals or if patient is discharged from hospital.  The above assessment, treatment plan, treatment alternatives and goals were discussed and mutually agreed upon: by patient and by family  Pt began working on selfcare retraining at sink level during session.  Daughter present and able to offer assistance as to PLOF.  Pt resistant at times and would become agitated when therapist asked her to do things.  She was easily redirected however.  Pt with left visual field deficit and inattention.  Decreased awareness of current deficits as well.  Needed overall mod assist to complete selfcare tasks.    [name] OTR/L 11/01/2012, 12:47 PM

## 2012-11-01 NOTE — Progress Notes (Signed)
Social Work Assessment and Plan Social Work Assessment and Plan  Patient Details  Name: [name] MRN: [id number] Date of Birth: 1918/09/27  Today's Date: 11/01/2012  Problem List:  Patient Active Problem List   Diagnosis Date Noted  . Cerebral embolism with cerebral infarction 10/26/2012  . Seizure 10/26/2012  . Acute respiratory failure 10/26/2012  . Intracranial hematoma 10/26/2012  . Acute combined systolic and diastolic congestive heart failure 10/12/2012  . Hyponatremia 10/12/2012  . A-fib 10/12/2012  . Diabetes 10/12/2012  . Hyperlipidemia 10/12/2012  . Atherosclerosis of native arteries of the extremities with gangrene 04/10/2011  . Swelling of limb 04/10/2011   Past Medical History:  Past Medical History  Diagnosis Date  . Diabetes mellitus   . Hypertension   . Hyperlipidemia   . Arthritis   . AF (atrial fibrillation)   . Cataract     decreased vision in left eye  . Peripheral vascular disease   . CHF (congestive heart failure)    Past Surgical History:  Past Surgical History  Procedure Laterality Date  . Pacemaker insertion  2008  . Cholecystectomy    . Thyroid surgery     Social History:  reports that she has never smoked. She has never used smokeless tobacco. She reports that she does not drink alcohol or use illicit drugs.  Family / Support Systems Marital Status: Widow/Widower Patient Roles: Parent Children: Darrel-son  [phone]  [phone] Other Supports: TEFL teacher here from [facility] Anticipated Caregiver: Unsure at this time Ability/Limitations of Caregiver: Daughter needs to go back to [place] and Darrel works fulltime Caregiver Availability: Other (Comment) (Unsure at this time) Family Dynamics: Pt has two son's local her oldest has cancer.  Darrel is supportive and involved with her.  Daughter-Kathy is here from [place] and plans to stay for a short time and help with the transition.  Social History Preferred language:  English Religion:  Cultural Background: No issues Education: High School Read: Yes Write: Yes Employment Status: Retired Fish farm manager Issues: No issues Guardian/Conservator: None-according to MD pt is capable of making her own decisions.   Abuse/Neglect Physical Abuse: Denies Verbal Abuse: Denies Sexual Abuse: Denies Exploitation of patient/patient's resources: Denies Self-Neglect: Denies  Emotional Status Pt's affect, behavior adn adjustment status: Pt is very HOH and used to doing her own thing.  She wants to get back where she was before the stroke but this is hard work.  She will try but she reminded me she is 78 years old and an old woman. Will ask team to space out her therapies so she can have a break in between. Recent Psychosocial Issues: Other medical issues-stressor of her oldest son's cancer is back and prognosis is poor Pyschiatric History: No history-deferred depression screen due to tired from therpaies, she had three in a row.  WIll monitor her coping and provide support while here. Substance Abuse History: No issues  Patient / Family Perceptions, Expectations & Goals Pt/Family understanding of illness & functional limitations: Pt and daughter can explain her stroke and deficits.  Both are hopeful she will do well here and get back home.  This is her goal to return home when she is done here.  She would need to be mod/i to return home, since no one is there 24 hr with her. Premorbid pt/family roles/activities: Mother, Grandmother, Home owner, Mining engineer, etc Anticipated changes in roles/activities/participation: Resume Pt/family expectations/goals: Pt states; " This is hard work, I am old."  Daughter states: " I hope she will  do well here, we don't want her to have to go to a nursing home."  [employer]: None Premorbid Home Care/DME Agencies: Other (Comment) (Has DME) Transportation available at discharge: Family Resource  referrals recommended: Support group (specify) ([facility] Support group)  Discharge Planning Living Arrangements: Alone Support Systems: Children;Church/faith community Type of Residence: Private residence Insurance Resources: Media planner (specify) (AARP Medicare) Financial Resources: Social Security Financial Screen Referred: No Living Expenses: Own Money Management: Patient;Family Does the patient have any problems obtaining your medications?: No Home Management: HIred someone to clean monthly-pt did simple meal preparation Patient/Family Preliminary Plans: Pt will need to get mod/i levle to return home.  Son works fulltime and can not be there with her.  Daughter is here for a short time.  WIll await team's evaluations and go from there. Social Work Anticipated Follow Up Needs: HH/OP;SNF;Support Group  Clinical Impression Pleasant female who is feeling this is a bit much for her and her old bones.  Supportive daughter who pushes her Mom but also threatens her if she can't get to the level she needs to be then she will need to go NHP. Pt would need to be mod/i with her walker to return home alone with son coming intermittently.  Will await pt's progress and team goals to formulate a discharge plan.  Lucy Chris 11/01/2012, 12:26 PM

## 2012-11-02 ENCOUNTER — Inpatient Hospital Stay (HOSPITAL_COMMUNITY): Payer: Medicare Other | Admitting: Speech Pathology

## 2012-11-02 ENCOUNTER — Other Ambulatory Visit: Payer: Self-pay

## 2012-11-02 ENCOUNTER — Inpatient Hospital Stay (HOSPITAL_COMMUNITY): Payer: Medicare Other | Admitting: Physical Therapy

## 2012-11-02 ENCOUNTER — Inpatient Hospital Stay (HOSPITAL_COMMUNITY): Payer: Medicare Other | Admitting: Occupational Therapy

## 2012-11-02 DIAGNOSIS — I6330 Cerebral infarction due to thrombosis of unspecified cerebral artery: Secondary | ICD-10-CM

## 2012-11-02 LAB — GLUCOSE, CAPILLARY
Glucose-Capillary: 119 mg/dL — ABNORMAL HIGH (ref 70–99)
Glucose-Capillary: 122 mg/dL — ABNORMAL HIGH (ref 70–99)

## 2012-11-02 MED ORDER — AMIODARONE HCL 200 MG PO TABS
400.0000 mg | ORAL_TABLET | Freq: Every day | ORAL | Status: DC
  Administered 2012-11-03 – 2012-11-08 (×6): 400 mg via ORAL
  Filled 2012-11-02 (×7): qty 2

## 2012-11-02 NOTE — Progress Notes (Signed)
Occupational Therapy Session Note  Patient Details  Name: [name] MRN: [id number] Date of Birth: 04/01/1918  Today's Date: 11/02/2012 Time: 1500-1530 Time Calculation (min)=30  Skilled Therapeutic Interventions/Progress Updates:  Patient completed Toileting and LB dressing.   Toilet transfer via  rollator and grab bar=Close S and reminders to lock brakes;  Toileting=Min A to reach very back of buttocks.  Patient able to don clean, fresh panties sitting Edge of toilet with cues to orient back vs.  Front.  Dtr stated she will sew tags in the backs of all her mom's panties so that she can feel or see the tags and put them on the right direction.   Patient was able to don short pants sitting Edge of toilet with Supervision.    Patient required reminders to put on rollator brakes each time before standing today (sit to stand at rollator at bed; sit to stand at rollator from recliner; sit to stand at rollator from toilet).  Each time Patient leaned forward on rollator during functional ambulation to retrieve clothing, go to bathroom, etc., dtr reminded mom to stand up with hands in proper place at rollator instead of leaning forearms onto rollator.  Patient tended to lean forward on rollator even when she was not in pain during today's session.  But she quickly corrected her posture and safety when reminded not to lean on rollator.     Therapy Documentation Precautions:  Precautions Precautions: Fall Precaution Comments: L sided weakness and inattention Restrictions Weight Bearing Restrictions: No  Pain: Pain Assessment Pain Assessment: No/denies pain  See FIM for current functional status  Therapy/Group: Individual Therapy  Bud Face [facility] 11/02/2012, 3:59 PM

## 2012-11-02 NOTE — Progress Notes (Signed)
Physical Therapy Session Note  Patient Details  Name: [name] MRN: [id number] Date of Birth: [birth year]  Today's Date: 11/02/2012 Time: 1530-1630 Time Calculation (min): 60 min  Short Term Goals: Week 1:  PT Short Term Goal 1 (Week 1): Pt will perform transfers with RW at min assist PT Short Term Goal 2 (Week 1): Pt will propel w/c x 50' using BUEs or LEs as needed at supervision level PT Short Term Goal 3 (Week 1): Pt will perform dynamic standing balance at min assist PT Short Term Goal 4 (Week 1): Pt will ambulate x 50' with LRAD at [facility] assist  Therapy Documentation Precautions:  Precautions Precautions: Fall Precaution Comments: L sided weakness and inattention Restrictions Weight Bearing Restrictions: No Pain: Pain Assessment: No/denies pain but does get Voltaren gel for knees.  Therapeutic Activity:(30') Bed mobility rolling R and L with use of bed rails S/Mod-I, scooting to [facility] S/Max-A, Transfers Left side lying to sit with S/min-A, scooting to EOB with min-A, sit->stand into Rollator with S/Mod-I and stand->sit with uncontrolled descent due to severe OA of knees.                                          Patients' daughter and son present stated that this is how she has done it for years due to knee OA/DJD. Therapeutic Exercise:(15') B LE's in supine and in sitting with manual resistance. Gait Training:(15') 3 x 120' using Rollator (handgrips are set to allow for forearms to rest on for weightbearing and mild trunk lean due to severe OA of knees).   Therapy/Group: Individual Therapy  Cristopher Ciccarelli J 11/02/2012, 3:40 PM

## 2012-11-02 NOTE — Progress Notes (Signed)
Speech Language Pathology Daily Session Note  Patient Details  Name: [name] MRN: [id number] Date of Birth: [birth year]  Today's Date: 11/02/2012 Time: 0940-1010 Time Calculation (min): 30 min  Short Term Goals: Week 1: SLP Short Term Goal 1 (Week 1): Pt will demonstrate functional problem solving for mildly complex tasks with supervision verbal cues. SLP Short Term Goal 2 (Week 1): Pt will attend to left field of enviornment with functional tasks with supervision verbal & question cues.  SLP Short Term Goal 3 (Week 1): Pt will utilize external memory aids to recall new, daily information with Mod I.  SLP Short Term Goal 4 (Week 1): Pt will identify 1 physical and 1 cognitive task with supervision question cues.  SLP Short Term Goal 5 (Week 1): Pt will initiate functional tasks with Mod I.   Skilled Therapeutic Interventions: Therapeutic intervention complete, cognitive retraining skills. Patient was able to use environment with min A to recall new, daily information.  She required min A to correctly answer/perform mildly complex problem solving activities.  Continue with current treatment plan.    FIM:  Comprehension Comprehension Mode: Auditory Comprehension: 5-Understands basic 90% of the time/requires cueing < 10% of the time Expression Expression Mode: Verbal Expression: 5-Expresses complex 90% of the time/cues < 10% of the time Social Interaction Social Interaction: 4-Interacts appropriately 75 - 89% of the time - Needs redirection for appropriate language or to initiate interaction. Problem Solving Problem Solving: 3-Solves basic 50 - 74% of the time/requires cueing 25 - 49% of the time Memory Memory: 4-Recognizes or recalls 75 - 89% of the time/requires cueing 10 - 24% of the time FIM - Eating Eating Activity: 5: Set-up assist for open containers  Pain Pain Assessment Pain Assessment: No/denies pain  Therapy/Group: Individual Therapy  Lenny Pastel 11/02/2012, 1:07 PM

## 2012-11-02 NOTE — Progress Notes (Signed)
Occupational Therapy Session Note  Patient Details  Name: [name] MRN: [id number] Date of Birth: 08-21-1918  Today's Date: 11/02/2012 Time: 9147-8295 Time Calculation=70 min  Skilled Therapeutic Interventions/Progress Updates: ADL in w/c at sink with focus on sit to stand, thoracic extension and balance while peribathing and pulling up pants.   Patient groggy and inititially complained that she was not going to get out of bed.   Fortunately dtr  Was able to calm down mom and help her to participate in the session.  Dtr explained that her mom ususally does not get up until around 9 am and "stirs very little except to go to the bathroom."  Once somewhat awake patient became very sociable and made humorous comments and tolerated the session very well.   Patient was hard of hearing and dtr suggested that staff speak in a low pitch and slowly so that "Lynnetta" can hear and understand what is being spoken.  Therapy Documentation Precautions:  Precautions Precautions: Fall Precaution Comments: L sided weakness and inattention Restrictions Weight Bearing Restrictions: No   Pain: bilateral knees constant but patient unable to give pain scale rating  See FIM for current functional status  Therapy/Group: Individual Therapy  Bud Face [facility] 11/02/2012, 2:37 PM

## 2012-11-02 NOTE — Progress Notes (Signed)
Patient ID: [name], female   DOB: 1918/10/06, 79 y.o.   MRN: [id number] Subjective/Complaints: 79 y.o. female with history of DM, HTN, A Fib; who was admitted on 10/26/12 with left sided weakness and slurred speech. CT with question of early ischemic changes in right insula and patient treated with TPA. Patient with seizure episode with hypoxia later that evening requiring intubation. Started on Keppra. Follow up CT head revealed evolving right MCA infarcts 4.6 X 3.8 cm. 2 D echo with EF 55-65% and no wall abnormality, moderate to severe AVR and MVR. Carotid dopplers difficult to visualize due to movement. Neurology recommends plavix for CVA prophylaxis--but to discuss NOAC with Dr. Eldridge Dace due to patient's history of Afib   Having a hard time waking up for therapy. Knees have been sore.  Review of Systems  HENT: Positive for hearing loss.   All other systems reviewed and are negative.   Objective: Vital Signs: Blood pressure 122/74, pulse 91, temperature 98.4 F (36.9 C), temperature source Oral, resp. rate 19, height 5\' 2"  (1.575 m), weight 84.6 kg (186 lb 8.2 oz), SpO2 97.00%. Dg Chest 2 View  11/01/2012   CLINICAL DATA:  Cough, congestion.  EXAM: CHEST  2 VIEW  COMPARISON:  10/29/2012  FINDINGS: Left pacer remains in place, unchanged. Mild cardiomegaly. Bibasilar opacities, likely atelectasis. There are small bilateral effusions. No acute bony abnormality. Some improvement in aeration since prior study.  IMPRESSION: Further decrease in bilateral effusions, now small. Minimal bibasilar atelectasis.   Electronically Signed   By: Charlett Nose M.D.   On: 11/01/2012 10:12   Results for orders placed during the hospital encounter of 10/31/12 (from the past 72 hour(s))  GLUCOSE, CAPILLARY     Status: Abnormal   Collection Time    10/31/12  5:04 PM      Result Value Range   Glucose-Capillary 141 (*) 70 - 99 mg/dL  COMPREHENSIVE METABOLIC PANEL     Status: Abnormal   Collection Time   10/31/12  7:20 PM      Result Value Range   Sodium 133 (*) 135 - 145 mEq/L   Potassium 3.6  3.5 - 5.1 mEq/L   Chloride 95 (*) 96 - 112 mEq/L   CO2 27  19 - 32 mEq/L   Glucose, Bld 170 (*) 70 - 99 mg/dL   BUN 14  6 - 23 mg/dL   Creatinine, Ser 4.09  0.50 - 1.10 mg/dL   Calcium 7.9 (*) 8.4 - 10.5 mg/dL   Total Protein 5.6 (*) 6.0 - 8.3 g/dL   Albumin 2.5 (*) 3.5 - 5.2 g/dL   AST 19  0 - 37 U/L   ALT 12  0 - 35 U/L   Alkaline Phosphatase 55  39 - 117 U/L   Total Bilirubin 1.0  0.3 - 1.2 mg/dL   GFR calc non Af Amer 62 (*) >90 mL/min   GFR calc Af Amer 71 (*) >90 mL/min   Comment: (NOTE)     The eGFR has been calculated using the CKD EPI equation.     This calculation has not been validated in all clinical situations.     eGFR's persistently <90 mL/min signify possible Chronic Kidney     Disease.  GLUCOSE, CAPILLARY     Status: Abnormal   Collection Time    10/31/12  8:45 PM      Result Value Range   Glucose-Capillary 136 (*) 70 - 99 mg/dL  CBC WITH DIFFERENTIAL     Status:  Abnormal   Collection Time    11/01/12  6:25 AM      Result Value Range   WBC 11.7 (*) 4.0 - 10.5 K/uL   RBC 4.51  3.87 - 5.11 MIL/uL   Hemoglobin 14.0  12.0 - 15.0 g/dL   HCT 16.1  09.6 - 04.5 %   MCV 90.5  78.0 - 100.0 fL   MCH 31.0  26.0 - 34.0 pg   MCHC 34.3  30.0 - 36.0 g/dL   RDW 40.9  81.1 - 91.4 %   Platelets 321  150 - 400 K/uL   Neutrophils Relative % 72  43 - 77 %   Neutro Abs 8.5 (*) 1.7 - 7.7 K/uL   Lymphocytes Relative 15  12 - 46 %   Lymphs Abs 1.8  0.7 - 4.0 K/uL   Monocytes Relative 11  3 - 12 %   Monocytes Absolute 1.3 (*) 0.1 - 1.0 K/uL   Eosinophils Relative 1  0 - 5 %   Eosinophils Absolute 0.2  0.0 - 0.7 K/uL   Basophils Relative 0  0 - 1 %   Basophils Absolute 0.0  0.0 - 0.1 K/uL  GLUCOSE, CAPILLARY     Status: Abnormal   Collection Time    11/01/12  7:07 AM      Result Value Range   Glucose-Capillary 112 (*) 70 - 99 mg/dL   Comment 1 Notify RN    GLUCOSE, CAPILLARY      Status: Abnormal   Collection Time    11/01/12 11:31 AM      Result Value Range   Glucose-Capillary 168 (*) 70 - 99 mg/dL   Comment 1 Notify RN    URINALYSIS, ROUTINE W REFLEX MICROSCOPIC     Status: Abnormal   Collection Time    11/01/12  1:35 PM      Result Value Range   Color, Urine YELLOW  YELLOW   APPearance CLOUDY (*) CLEAR   Specific Gravity, Urine 1.010  1.005 - 1.030   pH 6.5  5.0 - 8.0   Glucose, UA NEGATIVE  NEGATIVE mg/dL   Hgb urine dipstick TRACE (*) NEGATIVE   Bilirubin Urine NEGATIVE  NEGATIVE   Ketones, ur NEGATIVE  NEGATIVE mg/dL   Protein, ur NEGATIVE  NEGATIVE mg/dL   Urobilinogen, UA 2.0 (*) 0.0 - 1.0 mg/dL   Nitrite NEGATIVE  NEGATIVE   Leukocytes, UA LARGE (*) NEGATIVE  URINE MICROSCOPIC-ADD ON     Status: Abnormal   Collection Time    11/01/12  1:35 PM      Result Value Range   Squamous Epithelial / LPF MANY (*) RARE   WBC, UA TOO NUMEROUS TO COUNT  <3 WBC/hpf   RBC / HPF 0-2  <3 RBC/hpf   Bacteria, UA FEW (*) RARE  GLUCOSE, CAPILLARY     Status: Abnormal   Collection Time    11/01/12  4:49 PM      Result Value Range   Glucose-Capillary 122 (*) 70 - 99 mg/dL  GLUCOSE, CAPILLARY     Status: Abnormal   Collection Time    11/01/12  8:30 PM      Result Value Range   Glucose-Capillary 128 (*) 70 - 99 mg/dL  GLUCOSE, CAPILLARY     Status: Abnormal   Collection Time    11/02/12  7:03 AM      Result Value Range   Glucose-Capillary 108 (*) 70 - 99 mg/dL      Nursing note and  vitals reviewed.  Constitutional: She is oriented to person, place, and time. She appears well-developed and well-nourished.  HENT:  Head: Normocephalic and atraumatic.  Eyes: Conjunctivae are normal. Pupils are equal, round, and reactive to light.  Neck: Normal range of motion. Neck supple.  Cardiovascular: Normal rate. An irregularly irregular rhythm present.  Pulmonary/Chest: No respiratory distress. She has decreased breath sounds in the right lower field and the left  lower field. She has no wheezes.  Abdominal: Soft. Bowel sounds are normal. She exhibits no distension. There is no tenderness.  Musculoskeletal: She exhibits no edema. Left knee with a small effusion Discomfort and decreased ROM bilateral knees  Neurological: She is alert and oriented to person, place, and time.  Follows commands without difficulty. Speech clear. LUE with pronator drift. Left sided weakness, 3/5 Delt, bi, tri, grip 4/5 L HF KE, 3-/5 ADF/PF , effort inconsistent however. Has grossly intact sense to pain and light touch. 4/5 R side except 3- HF Cognitively fairly intact. Is very HOH  Skin: Skin is warm and dry. No erythema.  Diffuse ecchymosis left forearm and petechial area on BLE.  Psychiatric: She has a normal mood and affect. Her behavior is normal. Thought content normal.    Assessment/Plan: 1. Functional deficits secondary to R MCA infarct which require 3+ hours per day of interdisciplinary therapy in a comprehensive inpatient rehab setting. Physiatrist is providing close team supervision and 24 hour management of active medical problems listed below. Physiatrist and rehab team continue to assess barriers to discharge/monitor patient progress toward functional and medical goals. FIM: FIM - Bathing Bathing Steps Patient Completed: Chest;Right Arm;Left Arm;Abdomen;Front perineal area;Right upper leg;Left upper leg Bathing: 3: Mod-Patient completes 5-7 24f 10 parts or 50-74%  FIM - Upper Body Dressing/Undressing Upper body dressing/undressing steps patient completed: Thread/unthread right bra strap;Thread/unthread left sleeve of pullover shirt/dress;Put head through opening of pull over shirt/dress;Thread/unthread right sleeve of pullover shirt/dresss Upper body dressing/undressing: 3: Mod-Patient completed 50-74% of tasks FIM - Lower Body Dressing/Undressing Lower body dressing/undressing steps patient completed: Thread/unthread right underwear leg;Thread/unthread left  underwear leg;Thread/unthread right pants leg;Thread/unthread left pants leg;Pull underwear up/down;Pull pants up/down Lower body dressing/undressing: 3: Mod-Patient completed 50-74% of tasks  FIM - Toileting Toileting: 1: Total-Patient completed zero steps, helper did all 3  FIM - Diplomatic Services operational officer Devices: Bedside commode Toilet Transfers: 3-To toilet/BSC: Mod A (lift or lower assist);3-From toilet/BSC: Mod A (lift or lower assist)  FIM - Bed/Chair Transfer Bed/Chair Transfer Assistive Devices: Arm rests;Bed rails Bed/Chair Transfer: 5: Supine > Sit: Supervision (verbal cues/safety issues);5: Sit > Supine: Supervision (verbal cues/safety issues);3: Bed > Chair or W/C: Mod A (lift or lower assist);3: Chair or W/C > Bed: Mod A (lift or lower assist)  FIM - Locomotion: Wheelchair Locomotion: Wheelchair: 1: Total Assistance/staff pushes wheelchair (Pt<25%) FIM - Locomotion: Ambulation Locomotion: Ambulation Assistive Devices:  (+2 (three muskateer style)) Ambulation/Gait Assistance: 1: +2 Total assist Locomotion: Ambulation: 1: Two helpers  Comprehension Comprehension Mode: Auditory Comprehension: 5-Understands basic 90% of the time/requires cueing < 10% of the time  Expression Expression Mode: Verbal Expression: 5-Expresses basic 90% of the time/requires cueing < 10% of the time.  Social Interaction Social Interaction: 4-Interacts appropriately 75 - 89% of the time - Needs redirection for appropriate language or to initiate interaction.  Problem Solving Problem Solving: 4-Solves basic 75 - 89% of the time/requires cueing 10 - 24% of the time  Memory Memory: 4-Recognizes or recalls 75 - 89% of the time/requires cueing 10 -  24% of the time  Medical Problem List and Plan:  1. DVT Prophylaxis/Anticoagulation: Pharmaceutical: Other (comment)--Eliquis  2. Pain Management: continue voltaren gel to help with chronic knee, utilize ice as well 3. Mood: Provide  ego support regarding life changes. Patient would like to be able to go back to her own home. Will have LCSW follow for evaluation and support.  4. Neuropsych: This patient is capable of making decisions on her own behalf.  5. A trial Fibrillation: Monitor HR with bid checks. Continue amiodarone 400 mg bid with metoprolol 25 mg bid.  6. Hyponatremia/ Hypokalemia: Has been getting IV lasix on bid basis. Will add K+ supplement. Will follow up on electrolyte abnormalities in am.  8. DM type 2: Monitor BS with ac/hs cbg checks. Resumed metformin. Good control so far 9. Chronic mixed CHF: Recent admission for overload 08/15-08/20/14. Now with Left > Right pleural effusions--- continue po lasix. Check daily weights. Low salt diet.  10. +UA--empiric cipro, culture pending LOS (Days) 2 A FACE TO FACE EVALUATION WAS PERFORMED  Ruthetta Koopmann T 11/02/2012, 8:10 AM

## 2012-11-02 NOTE — Progress Notes (Signed)
SUBJECTIVE:  Feels ok.  Minimal palpitations.  OBJECTIVE:   Vitals:   Filed Vitals:   11/01/12 1616 11/01/12 1622 11/01/12 2140 11/02/12 0511  BP:  116/58 100/60 122/74  Pulse: 115   91  Temp: 98.9 F (37.2 C)   98.4 F (36.9 C)  TempSrc: Oral   Oral  Resp: 18   19  Height:      Weight:      SpO2: 97%   97%   I&O's:   Intake/Output Summary (Last 24 hours) at 11/02/12 4098 Last data filed at 11/01/12 1826  Gross per 24 hour  Intake    300 ml  Output      0 ml  Net    300 ml   TELEMETRY: N/A     PHYSICAL EXAM General: Well developed, well nourished, in no acute distress Head:    Normal cephalic and atramatic  Lungs:   Clear bilaterally to auscultation and percussion. Heart:  Irregularly irregular, rate controlled ,              No JVD.  Msk:   Normal strength and tone for age. Extremities:   No  edema.  DP +1 Neuro: Alert and oriented X 3. Psych:  Normal affect, responds appropriately   LABS: Basic Metabolic Panel:  Recent Labs  11/91/47 0850 10/31/12 1920  NA 132* 133*  K 4.3 3.6  CL 93* 95*  CO2 26 27  GLUCOSE 205* 170*  BUN 13 14  CREATININE 0.79 0.81  CALCIUM 8.2* 7.9*   Liver Function Tests:  Recent Labs  10/31/12 1920  AST 19  ALT 12  ALKPHOS 55  BILITOT 1.0  PROT 5.6*  ALBUMIN 2.5*   No results found for this basename: LIPASE, AMYLASE,  in the last 72 hours CBC:  Recent Labs  11/01/12 0625  WBC 11.7*  NEUTROABS 8.5*  HGB 14.0  HCT 40.8  MCV 90.5  PLT 321   Cardiac Enzymes: No results found for this basename: CKTOTAL, CKMB, CKMBINDEX, TROPONINI,  in the last 72 hours BNP: No components found with this basename: POCBNP,  D-Dimer: No results found for this basename: DDIMER,  in the last 72 hours Hemoglobin A1C: No results found for this basename: HGBA1C,  in the last 72 hours Fasting Lipid Panel: No results found for this basename: CHOL, HDL, LDLCALC, TRIG, CHOLHDL, LDLDIRECT,  in the last 72 hours Thyroid Function  Tests: No results found for this basename: TSH, T4TOTAL, FREET3, T3FREE, THYROIDAB,  in the last 72 hours Anemia Panel: No results found for this basename: VITAMINB12, FOLATE, FERRITIN, TIBC, IRON, RETICCTPCT,  in the last 72 hours Coag Panel:   Lab Results  Component Value Date   INR 1.07 10/26/2012   INR 1.84* 08/19/2009   INR 2.3* 11/09/2007    RADIOLOGY: Dg Chest 2 View  11/01/2012   CLINICAL DATA:  Cough, congestion.  EXAM: CHEST  2 VIEW  COMPARISON:  10/29/2012  FINDINGS: Left pacer remains in place, unchanged. Mild cardiomegaly. Bibasilar opacities, likely atelectasis. There are small bilateral effusions. No acute bony abnormality. Some improvement in aeration since prior study.  IMPRESSION: Further decrease in bilateral effusions, now small. Minimal bibasilar atelectasis.   Electronically Signed   By: Charlett Nose M.D.   On: 11/01/2012 10:12   Dg Chest 2 View  10/12/2012   CLINICAL DATA:  Shortness of Breath. Fatigue.  EXAM: CHEST  2 VIEW  COMPARISON:  03/17/2007  FINDINGS: New bibasilar pulmonary opacity is seen which may  be due to atelectasis or infiltrates. Small pleural effusions are also new. Heart size is within normal limits. Dual lead transvenous pacemaker remains in appropriate position.  IMPRESSION: New bibasilar atelectasis versus infiltrates and small pleural effusions.   Electronically Signed   By: Myles Rosenthal   On: 10/12/2012 12:05   Ct Head Wo Contrast  10/27/2012   *RADIOLOGY REPORT*  Clinical Data: Status post t-PA  CT HEAD WITHOUT CONTRAST  Technique:  Contiguous axial images were obtained from the base of the skull through the vertex without contrast.  Comparison: 10/26/2012  Findings: The bony calvarium is intact.  There are again noted changes consistent with infarcts in the right posterior parietal region.  This area measures approximately 4.6 x 3.8 cm in greatest dimension.  No significant mass effect upon the adjacent ventricle is noted.  Mild chronic white matter  ischemic change is again seen. The hyperdensity seen in the branches of the right middle cerebral artery is less well visualized on the current study.  No acute hemorrhages seen.  IMPRESSION: Evolving right middle cerebral artery infarct.  No acute hemorrhage is noted.  Continued follow-up is recommended as indicated.   Original Report Authenticated By: Alcide Clever, M.D.   Ct Head Wo Contrast  10/26/2012   *RADIOLOGY REPORT*  Clinical Data: Acute right-sided CVA with new onset respiratory failure.  CVA treated with t-PA.Seizure.  CT HEAD WITHOUT CONTRAST  Technique:  Contiguous axial images were obtained from the base of the skull through the vertex without contrast.  Comparison: 10/26/2012.  Findings: Subacute infarction is present in the posterior right MCA territory with decreased attenuation of the gray and white matter. Posterior fossa structures appear within normal limits.  There is no hemorrhage.  Benign basal ganglia calcifications are present. On image number 11, they are this ICU patient in the posterior MCA branch, probably representing thrombosis.  There is no midline shift or hydrocephalus.  The calvarium appears intact.  IMPRESSION: Developing posterior right MCA division infarct.  No hemorrhage or complicating features in this patient with seizure.   Original Report Authenticated By: Andreas Newport, M.D.   Ct Head Wo Contrast  10/26/2012   *RADIOLOGY REPORT*  Clinical Data: Slurred speech and left-sided weakness.  Code stroke.  CT HEAD WITHOUT CONTRAST  Technique:  Contiguous axial images were obtained from the base of the skull through the vertex without contrast.  Comparison: 07/01/2010.  Findings:  Skull:No acute osseous abnormality. No lytic or blastic lesion.  Orbits: Right cataract resection.  Brain: No evidence of acute abnormality, such as acute infarction, hemorrhage, hydrocephalus, or mass lesion/mass effect (questionable loss of posterior right insula gray-white differentiation, not  definite given the scan quality).  Similar pattern of patchy bilateral cerebral white matter low attenuation.  Low attenuation in the of cortical left occipital pole was likely present previously, and does not explain provided history.  These results were called by telephone on 10/26/2012 at 07:30 a.m. to Dr. Thad Ranger, who verbally acknowledged these results.  IMPRESSION: 1. No acute intracranial hemorrhage. 2. Questionable early ischemic changes in the posterior right insula.  3.  Chronic small vessel ischemia.   Original Report Authenticated By: Tiburcio Pea   Dg Chest Port 1 View  10/29/2012   *RADIOLOGY REPORT*  Clinical Data: Right pleural effusion.  PORTABLE CHEST - 1 VIEW  Comparison: Plain film chest 10/27/2012 and 10/28/2012.  Findings: The patient's right pleural effusion and associated atelectasis appear improved.  There has been interval worsening of the patient's left pleural effusion  and atelectasis.  Cardiomegaly is noted.  No pneumothorax.  IMPRESSION: Decreased right and increased left pleural effusions and atelectasis.   Original Report Authenticated By: Holley Dexter, M.D.   Dg Chest Port 1 View  10/28/2012   *RADIOLOGY REPORT*  Clinical Data: Cerebral infarction, respiratory failure and extubation.  PORTABLE CHEST - 1 VIEW  Comparison: 10/27/2012  Findings: Endotracheal and nasogastric tubes been removed. Pacemaker shows stable positioning.  There remain bilateral lower lobe atelectasis/infiltrates with bilateral pleural effusions, right greater than left.  The heart size is stable.  No overt pulmonary edema is seen.  IMPRESSION: Stable bilateral lower lobe consolidation and pleural effusions.   Original Report Authenticated By: Irish Lack, M.D.   Dg Chest Port 1 View  10/27/2012   *RADIOLOGY REPORT*  Clinical Data: Check endotracheal tube  PORTABLE CHEST - 1 VIEW  Comparison: 10/26/2012  Findings: The endotracheal tube and nasogastric catheter are again identified and stable.  A  pacing device is again seen.  Increased density is noted over the bases bilaterally worse than that seen on the prior exam.  This is consistent with bilateral pleural effusions.  Likely underlying atelectasis is present.  No definitive pneumothorax is seen.  IMPRESSION: Increasing bilateral pleural effusions.   Original Report Authenticated By: Alcide Clever, M.D.   Dg Chest Port 1 View  10/26/2012   *RADIOLOGY REPORT*  Clinical Data: Endotracheal tube placement.  Evaluate ET tube.  PORTABLE CHEST - 1 VIEW  Comparison: 10/12/2012.  Findings: The endotracheal tube tip is 31 mm from the carina. Interval intubation.  Enteric tube is present with the tip not visualized.  Defibrillator pads overlie the chest.  Left subclavian dual lead pacemaker.  Right costophrenic angle is excluded from view.  There is lucency in the left upper quadrant suspicious for intra-abdominal free air. Development of bilateral effusions, basilar atelectasis and basilar airspace disease.  IMPRESSION: 1.  Interval intubation with endotracheal tube tip 31 mm from the carina. 2.  Enteric tube present with the tip not visualized. 3.  Possible free intra-abdominal air in the left upper quadrant. Critical Value/emergent results were called by telephone at the time of interpretation on 10/26/2012 at 1519 hours to [facility], RN, who verbally acknowledged these results. 4.  Development of pleural effusions and basilar airspace disease likely representing CHF.   Original Report Authenticated By: Andreas Newport, M.D.      ASSESSMENT: AFib, Fluid overload.  PLAN:  Since she is not on tele, will decrease Amiodarone to 400 mg daily.  Check ECG in AM.  Eliquis for stroke prevention.  Upon presentation to the hospital, she was in NSR.  Continue daily Lasix.  She appears euvolemic.  Corky Crafts., MD  11/02/2012  9:04 AM

## 2012-11-03 ENCOUNTER — Inpatient Hospital Stay (HOSPITAL_COMMUNITY): Payer: Medicare Other | Admitting: Physical Therapy

## 2012-11-03 LAB — GLUCOSE, CAPILLARY: Glucose-Capillary: 109 mg/dL — ABNORMAL HIGH (ref 70–99)

## 2012-11-03 NOTE — Progress Notes (Signed)
Patient with poor appetite. "i'm just not hungry." Declined HS snack. Very HOH at night when hearing aides are out. Difficult to keep bilateral heels elevated off the bed, because patient Independent with bed mobility and restless. 2 foam dressings C, D, & I to left heel. Frequent productive cough. Coughing up white secretions. Encouraged I.S. And flutter valve.Alfredo Martinez A

## 2012-11-03 NOTE — Progress Notes (Signed)
Patient ID: [name], female   DOB: March 18, 1918, 79 y.o.   MRN: [id number] Subjective/Complaints: 79 y.o. female with history of DM, HTN, A Fib; who was admitted on 10/26/12 with left sided weakness and slurred speech. CT with question of early ischemic changes in right insula and patient treated with TPA. Patient with seizure episode with hypoxia later that evening requiring intubation. Started on Keppra. Follow up CT head revealed evolving right MCA infarcts 4.6 X 3.8 cm. 2 D echo with EF 55-65% and no wall abnormality, moderate to severe AVR and MVR. Carotid dopplers difficult to visualize due to movement. Neurology recommends plavix for CVA prophylaxis--but to discuss NOAC with Dr. Eldridge Dace due to patient's history of Afib   Awake this am. "i need to go to the bathroom.". Denies other issues  Review of Systems  HENT: Positive for hearing loss.   All other systems reviewed and are negative.   Objective: Vital Signs: Blood pressure 126/62, pulse 82, temperature 98.3 F (36.8 C), temperature source Oral, resp. rate 18, height 5\' 2"  (1.575 m), weight 84.6 kg (186 lb 8.2 oz), SpO2 100.00%. Dg Chest 2 View  11/01/2012   CLINICAL DATA:  Cough, congestion.  EXAM: CHEST  2 VIEW  COMPARISON:  10/29/2012  FINDINGS: Left pacer remains in place, unchanged. Mild cardiomegaly. Bibasilar opacities, likely atelectasis. There are small bilateral effusions. No acute bony abnormality. Some improvement in aeration since prior study.  IMPRESSION: Further decrease in bilateral effusions, now small. Minimal bibasilar atelectasis.   Electronically Signed   By: Charlett Nose M.D.   On: 11/01/2012 10:12   Results for orders placed during the hospital encounter of 10/31/12 (from the past 72 hour(s))  GLUCOSE, CAPILLARY     Status: Abnormal   Collection Time    10/31/12  5:04 PM      Result Value Range   Glucose-Capillary 141 (*) 70 - 99 mg/dL  COMPREHENSIVE METABOLIC PANEL     Status: Abnormal   Collection Time     10/31/12  7:20 PM      Result Value Range   Sodium 133 (*) 135 - 145 mEq/L   Potassium 3.6  3.5 - 5.1 mEq/L   Chloride 95 (*) 96 - 112 mEq/L   CO2 27  19 - 32 mEq/L   Glucose, Bld 170 (*) 70 - 99 mg/dL   BUN 14  6 - 23 mg/dL   Creatinine, Ser 4.09  0.50 - 1.10 mg/dL   Calcium 7.9 (*) 8.4 - 10.5 mg/dL   Total Protein 5.6 (*) 6.0 - 8.3 g/dL   Albumin 2.5 (*) 3.5 - 5.2 g/dL   AST 19  0 - 37 U/L   ALT 12  0 - 35 U/L   Alkaline Phosphatase 55  39 - 117 U/L   Total Bilirubin 1.0  0.3 - 1.2 mg/dL   GFR calc non Af Amer 62 (*) >90 mL/min   GFR calc Af Amer 71 (*) >90 mL/min   Comment: (NOTE)     The eGFR has been calculated using the CKD EPI equation.     This calculation has not been validated in all clinical situations.     eGFR's persistently <90 mL/min signify possible Chronic Kidney     Disease.  GLUCOSE, CAPILLARY     Status: Abnormal   Collection Time    10/31/12  8:45 PM      Result Value Range   Glucose-Capillary 136 (*) 70 - 99 mg/dL  CBC WITH DIFFERENTIAL  Status: Abnormal   Collection Time    11/01/12  6:25 AM      Result Value Range   WBC 11.7 (*) 4.0 - 10.5 K/uL   RBC 4.51  3.87 - 5.11 MIL/uL   Hemoglobin 14.0  12.0 - 15.0 g/dL   HCT 16.1  09.6 - 04.5 %   MCV 90.5  78.0 - 100.0 fL   MCH 31.0  26.0 - 34.0 pg   MCHC 34.3  30.0 - 36.0 g/dL   RDW 40.9  81.1 - 91.4 %   Platelets 321  150 - 400 K/uL   Neutrophils Relative % 72  43 - 77 %   Neutro Abs 8.5 (*) 1.7 - 7.7 K/uL   Lymphocytes Relative 15  12 - 46 %   Lymphs Abs 1.8  0.7 - 4.0 K/uL   Monocytes Relative 11  3 - 12 %   Monocytes Absolute 1.3 (*) 0.1 - 1.0 K/uL   Eosinophils Relative 1  0 - 5 %   Eosinophils Absolute 0.2  0.0 - 0.7 K/uL   Basophils Relative 0  0 - 1 %   Basophils Absolute 0.0  0.0 - 0.1 K/uL  GLUCOSE, CAPILLARY     Status: Abnormal   Collection Time    11/01/12  7:07 AM      Result Value Range   Glucose-Capillary 112 (*) 70 - 99 mg/dL   Comment 1 Notify RN    GLUCOSE, CAPILLARY      Status: Abnormal   Collection Time    11/01/12 11:31 AM      Result Value Range   Glucose-Capillary 168 (*) 70 - 99 mg/dL   Comment 1 Notify RN    URINALYSIS, ROUTINE W REFLEX MICROSCOPIC     Status: Abnormal   Collection Time    11/01/12  1:35 PM      Result Value Range   Color, Urine YELLOW  YELLOW   APPearance CLOUDY (*) CLEAR   Specific Gravity, Urine 1.010  1.005 - 1.030   pH 6.5  5.0 - 8.0   Glucose, UA NEGATIVE  NEGATIVE mg/dL   Hgb urine dipstick TRACE (*) NEGATIVE   Bilirubin Urine NEGATIVE  NEGATIVE   Ketones, ur NEGATIVE  NEGATIVE mg/dL   Protein, ur NEGATIVE  NEGATIVE mg/dL   Urobilinogen, UA 2.0 (*) 0.0 - 1.0 mg/dL   Nitrite NEGATIVE  NEGATIVE   Leukocytes, UA LARGE (*) NEGATIVE  URINE CULTURE     Status: None   Collection Time    11/01/12  1:35 PM      Result Value Range   Specimen Description URINE, CLEAN CATCH     Special Requests NONE     Culture  Setup Time       Value: 11/01/2012 13:59     Performed at [facility] Count PENDING     Culture       Value: Culture reincubated for better growth     Performed at [facility]   Report Status PENDING    URINE MICROSCOPIC-ADD ON     Status: Abnormal   Collection Time    11/01/12  1:35 PM      Result Value Range   Squamous Epithelial / LPF MANY (*) RARE   WBC, UA TOO NUMEROUS TO COUNT  <3 WBC/hpf   RBC / HPF 0-2  <3 RBC/hpf   Bacteria, UA FEW (*) RARE  GLUCOSE, CAPILLARY     Status: Abnormal   Collection  Time    11/01/12  4:49 PM      Result Value Range   Glucose-Capillary 122 (*) 70 - 99 mg/dL  GLUCOSE, CAPILLARY     Status: Abnormal   Collection Time    11/01/12  8:30 PM      Result Value Range   Glucose-Capillary 128 (*) 70 - 99 mg/dL  GLUCOSE, CAPILLARY     Status: Abnormal   Collection Time    11/02/12  7:03 AM      Result Value Range   Glucose-Capillary 108 (*) 70 - 99 mg/dL  GLUCOSE, CAPILLARY     Status: Abnormal   Collection Time    11/02/12 11:22 AM       Result Value Range   Glucose-Capillary 139 (*) 70 - 99 mg/dL   Comment 1 Notify RN    GLUCOSE, CAPILLARY     Status: Abnormal   Collection Time    11/02/12  4:35 PM      Result Value Range   Glucose-Capillary 119 (*) 70 - 99 mg/dL   Comment 1 Notify RN    GLUCOSE, CAPILLARY     Status: Abnormal   Collection Time    11/02/12  8:31 PM      Result Value Range   Glucose-Capillary 122 (*) 70 - 99 mg/dL  GLUCOSE, CAPILLARY     Status: Abnormal   Collection Time    11/03/12  7:12 AM      Result Value Range   Glucose-Capillary 109 (*) 70 - 99 mg/dL   Comment 1 Notify RN        Nursing note and vitals reviewed.  Constitutional: She is oriented to person, place, and time. She appears well-developed and well-nourished.  HENT:  Head: Normocephalic and atraumatic.  Eyes: Conjunctivae are normal. Pupils are equal, round, and reactive to light.  Neck: Normal range of motion. Neck supple.  Cardiovascular: Normal rate. An irregularly irregular rhythm present.  Pulmonary/Chest: No respiratory distress. She has decreased breath sounds in the right lower field and the left lower field. She has no wheezes.  Abdominal: Soft. Bowel sounds are normal. She exhibits no distension. There is no tenderness.  Musculoskeletal: She exhibits no edema. Left knee with a small effusion Discomfort and decreased ROM bilateral knees  Neurological: She is alert and oriented to person, place, and time.  Follows commands without difficulty. Speech clear. LUE with pronator drift. Left sided weakness, 3/5 Delt, bi, tri, grip 4/5 L HF KE, 3-/5 ADF/PF , effort inconsistent however. Has grossly intact sense to pain and light touch. 4/5 R side except 3- HF Cognitively fairly intact. Is very HOH  Skin: Skin is warm and dry. No erythema.  Diffuse ecchymosis left forearm and petechial area on BLE.  Psychiatric: She has a normal mood and affect. Her behavior is normal. Thought content normal.    Assessment/Plan: 1. Functional  deficits secondary to R MCA infarct which require 3+ hours per day of interdisciplinary therapy in a comprehensive inpatient rehab setting. Physiatrist is providing close team supervision and 24 hour management of active medical problems listed below. Physiatrist and rehab team continue to assess barriers to discharge/monitor patient progress toward functional and medical goals.  Will need encouragement to participate in therapy at times.   FIM: FIM - Bathing Bathing Steps Patient Completed: Chest;Right Arm;Left Arm;Abdomen;Front perineal area (elelcted not to wash legs and feet today) Bathing: 4: Min-Patient completes 8-9 60f 10 parts or 75+ percent  FIM - Upper Body Dressing/Undressing Upper body  dressing/undressing steps patient completed: Thread/unthread right bra strap;Thread/unthread left bra strap;Thread/unthread right sleeve of pullover shirt/dresss;Put head through opening of pull over shirt/dress;Thread/unthread left sleeve of pullover shirt/dress;Pull shirt over trunk Upper body dressing/undressing: 4: Min-Patient completed 75 plus % of tasks FIM - Lower Body Dressing/Undressing Lower body dressing/undressing steps patient completed: Thread/unthread right underwear leg;Thread/unthread left underwear leg;Pull underwear up/down;Thread/unthread right pants leg;Thread/unthread left pants leg;Pull pants up/down Lower body dressing/undressing: 4: Steadying Assist  FIM - Toileting Toileting steps completed by patient: Adjust clothing prior to toileting;Adjust clothing after toileting Toileting Assistive Devices: Grab bar or rail for support Toileting: 3: Mod-Patient completed 2 of 3 steps  FIM - Diplomatic Services operational officer Devices: Grab bars;Walker Toilet Transfers: 5-To toilet/BSC: Supervision (verbal cues/safety issues);5-From toilet/BSC: Supervision (verbal cues/safety issues)  FIM - Press photographer Assistive Devices: Arm rests;Bed  rails Bed/Chair Transfer: 5: Supine > Sit: Supervision (verbal cues/safety issues);5: Sit > Supine: Supervision (verbal cues/safety issues);3: Bed > Chair or W/C: Mod A (lift or lower assist);3: Chair or W/C > Bed: Mod A (lift or lower assist)  FIM - Locomotion: Wheelchair Locomotion: Wheelchair: 1: Total Assistance/staff pushes wheelchair (Pt<25%) FIM - Locomotion: Ambulation Locomotion: Ambulation Assistive Devices:  (+2 (three muskateer style)) Ambulation/Gait Assistance: 1: +2 Total assist Locomotion: Ambulation: 1: Two helpers  Comprehension Comprehension Mode: Auditory Comprehension: 3-Understands basic 50 - 74% of the time/requires cueing 25 - 50%  of the time  Expression Expression Mode: Verbal Expression: 5-Expresses complex 90% of the time/cues < 10% of the time  Social Interaction Social Interaction: 4-Interacts appropriately 75 - 89% of the time - Needs redirection for appropriate language or to initiate interaction.  Problem Solving Problem Solving: 3-Solves basic 50 - 74% of the time/requires cueing 25 - 49% of the time  Memory Memory: 4-Recognizes or recalls 75 - 89% of the time/requires cueing 10 - 24% of the time  Medical Problem List and Plan:  1. DVT Prophylaxis/Anticoagulation: Pharmaceutical: Other (comment)--Eliquis  2. Pain Management: continue voltaren gel to help with chronic knee, utilize ice as well 3. Mood: Provide ego support regarding life changes. Patient would like to be able to go back to her own home. Will have LCSW follow for evaluation and support.  4. Neuropsych: This patient is capable of making decisions on her own behalf.  5. A trial Fibrillation: Monitor HR with bid checks. Continue amiodarone 400 mg bid with metoprolol 25 mg bid.  6. Hyponatremia/ Hypokalemia: Has been getting IV lasix on bid basis. Will add K+ supplement. Will follow up on electrolyte abnormalities in am.  8. DM type 2: Monitor BS with ac/hs cbg checks. Resumed metformin.  Good control so far 9. Chronic mixed CHF: Recent admission for overload 08/15-08/20/14. Now with Left > Right pleural effusions--- continue po lasix. Check daily weights. Low salt diet.  10. +UA--empiric cipro, culture STILL pending LOS (Days) 3 A FACE TO FACE EVALUATION WAS PERFORMED  Wyley Hack T 11/03/2012, 7:47 AM

## 2012-11-03 NOTE — Progress Notes (Signed)
Physical Therapy Session Note  Patient Details  Name: [name] MRN: [id number] Date of Birth: [birth year]  Today's Date: 11/03/2012 Time: 0800-0857 Time Calculation (min): 57 min  Short Term Goals: Week 1:  PT Short Term Goal 1 (Week 1): Pt will perform transfers with RW at min assist PT Short Term Goal 2 (Week 1): Pt will propel w/c x 50' using BUEs or LEs as needed at supervision level PT Short Term Goal 3 (Week 1): Pt will perform dynamic standing balance at min assist PT Short Term Goal 4 (Week 1): Pt will ambulate x 50' with LRAD at [facility] assist  Skilled Therapeutic Interventions/Progress Updates:  Pt was seen bedside in the am, sitting on edge of bed with S. Assisted pt to don underwear, bra, shirt, and pants with S to min A. Pt ambulated distances of 60 and 75 feet x 2 with 4 wheeled walker and S. Pt utilizes the arms of 4 wheeled walker to weight bear through forearms rather than through the wrists/hands. Pt is able to transfer sit to stand with S to min A, depending on surface height. Pt transferred w/c to gerichair without assistive device and min A. Performed LE exercises for ROM and generalized strengthening to maximize independence.  Therapy Documentation Precautions:  Precautions Precautions: Fall Precaution Comments: L sided weakness and inattention Restrictions Weight Bearing Restrictions: No General:   Pain: No c/o pain.    Locomotion : Ambulation Ambulation/Gait Assistance: 5: Supervision   See FIM for current functional status  Therapy/Group: Individual Therapy  Rayford Halsted 11/03/2012, 12:38 PM

## 2012-11-04 ENCOUNTER — Inpatient Hospital Stay (HOSPITAL_COMMUNITY): Payer: Medicare Other

## 2012-11-04 ENCOUNTER — Inpatient Hospital Stay (HOSPITAL_COMMUNITY): Payer: Medicare Other | Admitting: Occupational Therapy

## 2012-11-04 ENCOUNTER — Inpatient Hospital Stay (HOSPITAL_COMMUNITY): Payer: Medicare Other | Admitting: Rehabilitation

## 2012-11-04 LAB — BASIC METABOLIC PANEL
BUN: 19 mg/dL (ref 6–23)
Chloride: 95 mEq/L — ABNORMAL LOW (ref 96–112)
GFR calc Af Amer: 52 mL/min — ABNORMAL LOW (ref >90)
Glucose, Bld: 103 mg/dL — ABNORMAL HIGH (ref 70–99)
Potassium: 4.2 mEq/L (ref 3.5–5.1)

## 2012-11-04 LAB — GLUCOSE, CAPILLARY
Glucose-Capillary: 167 mg/dL — ABNORMAL HIGH (ref 70–99)
Glucose-Capillary: 106 mg/dL — ABNORMAL HIGH (ref 70–99)

## 2012-11-04 MED ORDER — POTASSIUM CHLORIDE CRYS ER 20 MEQ PO TBCR
20.0000 meq | EXTENDED_RELEASE_TABLET | Freq: Two times a day (BID) | ORAL | Status: DC
  Administered 2012-11-04 – 2012-11-08 (×8): 20 meq via ORAL
  Filled 2012-11-04 (×8): qty 1

## 2012-11-04 NOTE — Progress Notes (Signed)
Occupational Therapy Session Note  Patient Details  Name: [name] MRN: [id number] Date of Birth: 1918/05/26  Today's Date: 11/04/2012 Time: 0902-1000 Time Calculation (min): 58 min  Short Term Goals: Week 1:  OT Short Term Goal 1 (Week 1): Pt will peform bathing with min assist sit to stand using AE PRN. OT Short Term Goal 2 (Week 1): Pt will donn bra and shirt during UB bathing with supervision and increased time. OT Short Term Goal 3 (Week 1): Pt will locate all selfcare items placed on the left side with no more than min questioning cues. OT Short Term Goal 4 (Week 1): Pt will perform toilet transfer and toileting with no more than min assist.  Skilled Therapeutic Interventions/Progress Updates:    Pt worked on showering this session while sitting on the tub bench.  She was able to bath with min steady assist to stand for washing peri area and her buttocks and min assist to cross her LLE over the right.  She needed min instructional cueing to remember to wash her left arm.  Performed dressing sit to stand at the sink with min assist as well except for her sports bra which she needed mod facilitation.  She also needs assistance to donn her slippers.  Her daughter was present during session and observed and was instructed on ways to help the pt but not do the work for her.    Therapy Documentation Precautions:  Precautions Precautions: Fall Precaution Comments: L sided weakness and inattention Restrictions Weight Bearing Restrictions: No  Pain: Pain Assessment Pain Assessment: Faces Faces Pain Scale: Hurts a little bit Pain Type: Chronic pain Pain Location: Knee Pain Orientation: Left Pain Intervention(s): Repositioned ADL: See FIM for current functional status  Therapy/Group: Individual Therapy  Terisha Losasso OTR/L 11/04/2012, 10:13 AM

## 2012-11-04 NOTE — Progress Notes (Signed)
Physical Therapy Session Note  Patient Details  Name: [name] MRN: [id number] Date of Birth: 09/25/1918  Today's Date: 11/04/2012 Time: 1415-1500 Time Calculation (min): 45 min  Short Term Goals: Week 1:  PT Short Term Goal 1 (Week 1): Pt will perform transfers with RW at min assist PT Short Term Goal 2 (Week 1): Pt will propel w/c x 50' using BUEs or LEs as needed at supervision level PT Short Term Goal 3 (Week 1): Pt will perform dynamic standing balance at min assist PT Short Term Goal 4 (Week 1): Pt will ambulate x 50' with LRAD at [facility] assist  Skilled Therapeutic Interventions/Progress Updates:   Pt seated in w/c finishing up with SLP upon PT entering room.  She was able to state what she had been working on and that she worked on bills earlier with daughter.  Daughter states she likes doing activities that involve her mind.  Pt requires mod encouragement to participate in therapy this afternoon.  She agreed to ambulate and perform therex during session.  Gait training x 85' x 1 and 35' x 1 with 4 wheeled walker at min/guard assist for steadying with cues for upright posture and slower gait speed for safety.  Daughter states she is walking almost more than she did at home in the house.  Performed seated therex on Nustep x 5 mins at level 3 resistance.  Pt took two rest breaks due to fatigue, however completed the time with encouragement from PT and daughter.  Performed standing activity at high/low table with focus on counting money (stated by therapist) and was located on the L.  Pt agreed to stand only one minute before returning to chair.  Assisted back to room and performed sit to supine at min assist for LLE into bed and cues for technique.  Pt left in bed with all needs in place and daughter present in room.    Therapy Documentation Precautions:  Precautions Precautions: Fall Precaution Comments: L sided weakness and inattention Restrictions Weight Bearing Restrictions: No    Vital Signs: Therapy Vitals Temp: 97.6 F (36.4 C) Temp src: Oral Pulse Rate: 94 Resp: 18 BP: 152/75 mmHg Patient Position, if appropriate: Lying Oxygen Therapy SpO2: 97 % Pain: Pain Assessment Pain Assessment: No/denies pain   Locomotion : Ambulation Ambulation/Gait Assistance: 4: Min assist;4: Min guard   See FIM for current functional status  Therapy/Group: Individual Therapy  Vista Deck 11/04/2012, 4:05 PM

## 2012-11-04 NOTE — Progress Notes (Signed)
Patient information reviewed and entered into eRehab system by Vaudie Engebretsen, RN, CRRN, PPS Coordinator.  Information including medical coding and functional independence measure will be reviewed and updated through discharge.     Per nursing patient was given "Data Collection Information Summary for Patients in Inpatient Rehabilitation Facilities with attached "Privacy Act Statement-Health Care Records" upon admission.  

## 2012-11-04 NOTE — Progress Notes (Signed)
Speech Language Pathology Daily Session Note  Patient Details  Name: [name] MRN: [id number] Date of Birth: 12-Jun-1918  Today's Date: 11/04/2012 Time: 1340-1415 Time Calculation (min): 35 min  Short Term Goals: Week 1: SLP Short Term Goal 1 (Week 1): Pt will demonstrate functional problem solving for mildly complex tasks with supervision verbal cues. SLP Short Term Goal 2 (Week 1): Pt will attend to left field of enviornment with functional tasks with supervision verbal & question cues.  SLP Short Term Goal 3 (Week 1): Pt will utilize external memory aids to recall new, daily information with Mod I.  SLP Short Term Goal 4 (Week 1): Pt will identify 1 physical and 1 cognitive task with supervision question cues.  SLP Short Term Goal 5 (Week 1): Pt will initiate functional tasks with Mod I.   Skilled Therapeutic Interventions: Treatment focused on cognitive goals. SLP facilitated session with Max cues for intellectual awareness of physical and cognitive deficits. SLP also offered words of encouragement to utilize her schedule as an external memory aid, as pt insisted that she does not need to know what therapies she has in the day. Pt completed a money management task with Min verbal cues, and reported that she had just balanced her checkbook with her daughter. Continue plan of care.   FIM:  Comprehension Comprehension Mode: Auditory Comprehension: 5-Understands basic 90% of the time/requires cueing < 10% of the time Expression Expression Mode: Verbal Expression: 5-Expresses complex 90% of the time/cues < 10% of the time Social Interaction Social Interaction: 4-Interacts appropriately 75 - 89% of the time - Needs redirection for appropriate language or to initiate interaction. Problem Solving Problem Solving: 4-Solves basic 75 - 89% of the time/requires cueing 10 - 24% of the time Memory Memory: 4-Recognizes or recalls 75 - 89% of the time/requires cueing 10 - 24% of the  time  Pain Pain Assessment Pain Assessment: No/denies pain  Therapy/Group: Individual Therapy  Maxcine Ham 11/04/2012, 3:12 PM  Maxcine Ham, M.A. CCC-SLP [phone]

## 2012-11-04 NOTE — Progress Notes (Signed)
Physical Therapy Session Note  Patient Details  Name: [name] MRN: [id number] Date of Birth: [birth year]  Today's Date: 11/04/2012 Time: 5621-3086 Time Calculation (min): 43 min  Short Term Goals: Week 1:  PT Short Term Goal 1 (Week 1): Pt will perform transfers with RW at min assist PT Short Term Goal 2 (Week 1): Pt will propel w/c x 50' using BUEs or LEs as needed at supervision level PT Short Term Goal 3 (Week 1): Pt will perform dynamic standing balance at min assist PT Short Term Goal 4 (Week 1): Pt will ambulate x 50' with LRAD at [facility] assist  Skilled Therapeutic Interventions/Progress Updates:   Focus of session was gait training and transfer training in/out of car.  Pt received in w/c with daughter present.  Note that pt had been crying.  Daughter states that she is somewhat "depressed" about being in this condition and that she has no one else around her from "her generation."  Continue to speak with pt and provide encouragement and motivation to participate with therapy and the benefits from therapy.  Pt self propelled w/c x 25' x 2 reps with use of B UEs.  Pt with increased c/o fatigue in shoulders with self propulsion.  Provided total assist remainder of distance to ortho gym to perform car transfer.  Pt performed car transfer via stand pivot with use of 4wheeled walker at min assist and cues for sequencing with LEs to ensure all the way at seat before sitting for safety.  Also provided cues for locking/unlocking breaks during transitions for safety.  Pt ambulated x 65' x 1 rep and 58' x 1 reps at min assist with use of 4 wheeled walker.  Pt continues to have forward flexed posture (at baseline, however is improved with walker elevated to higher setting, doubt that provided platform walker with translate well into home use).  Pt returned to room to recliner and performed seated quad sets x 10 reps each LE and seated SLR x 10 reps with cues for continued breathing.  Pt left in chair  with table over her and all needs in place.  Pt able to state to call help and not get up on her own.    Therapy Documentation Precautions:  Precautions Precautions: Fall Precaution Comments: L sided weakness and inattention Restrictions Weight Bearing Restrictions: No   Pain: Pain Assessment Pain Assessment: Faces Faces Pain Scale: Hurts a little bit Pain Type: Chronic pain Pain Location: Knee Pain Orientation: Left Pain Intervention(s): Repositioned   Locomotion : Ambulation Ambulation/Gait Assistance: 4: Min assist   See FIM for current functional status  Therapy/Group: Individual Therapy  Vista Deck 11/04/2012, 12:16 PM

## 2012-11-04 NOTE — Progress Notes (Signed)
Patient ID: [name], female   DOB: May 26, 1918, 79 y.o.   MRN: [id number] Subjective/Complaints: 79 y.o. female with history of DM, HTN, A Fib; who was admitted on 10/26/12 with left sided weakness and slurred speech. CT with question of early ischemic changes in right insula and patient treated with TPA. Patient with seizure episode with hypoxia later that evening requiring intubation. Started on Keppra. Follow up CT head revealed evolving right MCA infarcts 4.6 X 3.8 cm. 2 D echo with EF 55-65% and no wall abnormality, moderate to severe AVR and MVR. Carotid dopplers difficult to visualize due to movement. Neurology recommends plavix for CVA prophylaxis--but to discuss NOAC with Dr. Eldridge Dace due to patient's history of Afib   Awake this am. "i need to go to the bathroom.". Denies other issues  Review of Systems  HENT: Positive for hearing loss.   All other systems reviewed and are negative.   Objective: Vital Signs: Blood pressure 123/69, pulse 88, temperature 97.7 F (36.5 C), temperature source Oral, resp. rate 17, height 5\' 2"  (1.575 m), weight 84.6 kg (186 lb 8.2 oz), SpO2 97.00%. No results found. Results for orders placed during the hospital encounter of 10/31/12 (from the past 72 hour(s))  GLUCOSE, CAPILLARY     Status: Abnormal   Collection Time    11/01/12  7:07 AM      Result Value Range   Glucose-Capillary 112 (*) 70 - 99 mg/dL   Comment 1 Notify RN    GLUCOSE, CAPILLARY     Status: Abnormal   Collection Time    11/01/12 11:31 AM      Result Value Range   Glucose-Capillary 168 (*) 70 - 99 mg/dL   Comment 1 Notify RN    URINALYSIS, ROUTINE W REFLEX MICROSCOPIC     Status: Abnormal   Collection Time    11/01/12  1:35 PM      Result Value Range   Color, Urine YELLOW  YELLOW   APPearance CLOUDY (*) CLEAR   Specific Gravity, Urine 1.010  1.005 - 1.030   pH 6.5  5.0 - 8.0   Glucose, UA NEGATIVE  NEGATIVE mg/dL   Hgb urine dipstick TRACE (*) NEGATIVE   Bilirubin  Urine NEGATIVE  NEGATIVE   Ketones, ur NEGATIVE  NEGATIVE mg/dL   Protein, ur NEGATIVE  NEGATIVE mg/dL   Urobilinogen, UA 2.0 (*) 0.0 - 1.0 mg/dL   Nitrite NEGATIVE  NEGATIVE   Leukocytes, UA LARGE (*) NEGATIVE  URINE CULTURE     Status: None   Collection Time    11/01/12  1:35 PM      Result Value Range   Specimen Description URINE, CLEAN CATCH     Special Requests NONE     Culture  Setup Time       Value: 11/01/2012 13:59     Performed at [facility] Count PENDING     Culture       Value: Culture reincubated for better growth     Performed at [facility]   Report Status PENDING    URINE MICROSCOPIC-ADD ON     Status: Abnormal   Collection Time    11/01/12  1:35 PM      Result Value Range   Squamous Epithelial / LPF MANY (*) RARE   WBC, UA TOO NUMEROUS TO COUNT  <3 WBC/hpf   RBC / HPF 0-2  <3 RBC/hpf   Bacteria, UA FEW (*) RARE  GLUCOSE, CAPILLARY  Status: Abnormal   Collection Time    11/01/12  4:49 PM      Result Value Range   Glucose-Capillary 122 (*) 70 - 99 mg/dL  GLUCOSE, CAPILLARY     Status: Abnormal   Collection Time    11/01/12  8:30 PM      Result Value Range   Glucose-Capillary 128 (*) 70 - 99 mg/dL  GLUCOSE, CAPILLARY     Status: Abnormal   Collection Time    11/02/12  7:03 AM      Result Value Range   Glucose-Capillary 108 (*) 70 - 99 mg/dL  GLUCOSE, CAPILLARY     Status: Abnormal   Collection Time    11/02/12 11:22 AM      Result Value Range   Glucose-Capillary 139 (*) 70 - 99 mg/dL   Comment 1 Notify RN    GLUCOSE, CAPILLARY     Status: Abnormal   Collection Time    11/02/12  4:35 PM      Result Value Range   Glucose-Capillary 119 (*) 70 - 99 mg/dL   Comment 1 Notify RN    GLUCOSE, CAPILLARY     Status: Abnormal   Collection Time    11/02/12  8:31 PM      Result Value Range   Glucose-Capillary 122 (*) 70 - 99 mg/dL  GLUCOSE, CAPILLARY     Status: Abnormal   Collection Time    11/03/12  7:12 AM      Result  Value Range   Glucose-Capillary 109 (*) 70 - 99 mg/dL   Comment 1 Notify RN    GLUCOSE, CAPILLARY     Status: Abnormal   Collection Time    11/03/12 11:28 AM      Result Value Range   Glucose-Capillary 145 (*) 70 - 99 mg/dL   Comment 1 Notify RN    GLUCOSE, CAPILLARY     Status: None   Collection Time    11/03/12  4:25 PM      Result Value Range   Glucose-Capillary 96  70 - 99 mg/dL   Comment 1 Notify RN    GLUCOSE, CAPILLARY     Status: Abnormal   Collection Time    11/03/12  8:44 PM      Result Value Range   Glucose-Capillary 103 (*) 70 - 99 mg/dL      Nursing note and vitals reviewed.  Constitutional: She is oriented to person, place, and time. She appears well-developed and well-nourished.  HENT:  Head: Normocephalic and atraumatic.  Eyes: Conjunctivae are normal. Pupils are equal, round, and reactive to light.  Neck: Normal range of motion. Neck supple.  Cardiovascular: Normal rate. An irregularly irregular rhythm present.  Pulmonary/Chest: No respiratory distress. She has decreased breath sounds in the right lower field and the left lower field. She has no wheezes.  Abdominal: Soft. Bowel sounds are normal. She exhibits no distension. There is no tenderness.  Musculoskeletal: She exhibits no edema. Left knee with a small effusion Discomfort and decreased ROM bilateral knees  Neurological: She is alert and oriented to person, place, and time.  Follows commands without difficulty. Speech clear. LUE with pronator drift. Left sided weakness, 3/5 Delt, bi, tri, grip 4/5 L HF KE, 3-/5 ADF/PF , effort inconsistent however. Has grossly intact sense to pain and light touch. 4/5 R side except 3- HF Cognitively fairly intact. Is very HOH  Skin: Skin is warm and dry. No erythema.  Diffuse ecchymosis left forearm and petechial area  on BLE.  Psychiatric: She has a normal mood and affect. Her behavior is normal. Thought content normal.    Assessment/Plan: 1. Functional deficits  secondary to R MCA infarct which require 3+ hours per day of interdisciplinary therapy in a comprehensive inpatient rehab setting. Physiatrist is providing close team supervision and 24 hour management of active medical problems listed below. Physiatrist and rehab team continue to assess barriers to discharge/monitor patient progress toward functional and medical goals.  Will need encouragement to participate in therapy at times.   FIM: FIM - Bathing Bathing Steps Patient Completed: Chest;Right Arm;Left Arm;Abdomen;Front perineal area (elelcted not to wash legs and feet today) Bathing: 4: Min-Patient completes 8-9 39f 10 parts or 75+ percent  FIM - Upper Body Dressing/Undressing Upper body dressing/undressing steps patient completed: Thread/unthread right bra strap;Thread/unthread left bra strap;Thread/unthread right sleeve of pullover shirt/dresss;Put head through opening of pull over shirt/dress;Thread/unthread left sleeve of pullover shirt/dress;Pull shirt over trunk Upper body dressing/undressing: 4: Min-Patient completed 75 plus % of tasks FIM - Lower Body Dressing/Undressing Lower body dressing/undressing steps patient completed: Thread/unthread right underwear leg;Thread/unthread left underwear leg;Pull underwear up/down;Thread/unthread right pants leg;Thread/unthread left pants leg;Pull pants up/down Lower body dressing/undressing: 4: Steadying Assist  FIM - Toileting Toileting steps completed by patient: Adjust clothing prior to toileting;Adjust clothing after toileting Toileting Assistive Devices: Grab bar or rail for support Toileting: 3: Mod-Patient completed 2 of 3 steps  FIM - Diplomatic Services operational officer Devices: Grab bars;Walker Toilet Transfers: 5-To toilet/BSC: Supervision (verbal cues/safety issues);5-From toilet/BSC: Supervision (verbal cues/safety issues)  FIM - Press photographer Assistive Devices: Arm rests;Bed rails Bed/Chair  Transfer: 5: Supine > Sit: Supervision (verbal cues/safety issues);5: Sit > Supine: Supervision (verbal cues/safety issues);3: Bed > Chair or W/C: Mod A (lift or lower assist);3: Chair or W/C > Bed: Mod A (lift or lower assist)  FIM - Locomotion: Wheelchair Locomotion: Wheelchair: 1: Total Assistance/staff pushes wheelchair (Pt<25%) FIM - Locomotion: Ambulation Locomotion: Ambulation Assistive Devices: Designer, industrial/product Ambulation/Gait Assistance: 5: Supervision Locomotion: Ambulation: 2: Travels 50 - 149 ft with supervision/safety issues  Comprehension Comprehension Mode: Auditory Comprehension: 3-Understands basic 50 - 74% of the time/requires cueing 25 - 50%  of the time  Expression Expression Mode: Verbal Expression: 5-Expresses complex 90% of the time/cues < 10% of the time  Social Interaction Social Interaction: 4-Interacts appropriately 75 - 89% of the time - Needs redirection for appropriate language or to initiate interaction.  Problem Solving Problem Solving: 3-Solves basic 50 - 74% of the time/requires cueing 25 - 49% of the time  Memory Memory: 4-Recognizes or recalls 75 - 89% of the time/requires cueing 10 - 24% of the time  Medical Problem List and Plan:  1. DVT Prophylaxis/Anticoagulation: Pharmaceutical: Other (comment)--Eliquis  2. Pain Management: continue voltaren gel to help with chronic knee, utilize ice as well 3. Mood: Provide ego support regarding life changes. Patient would like to be able to go back to her own home. Will have LCSW follow for evaluation and support.  4. Neuropsych: This patient is capable of making decisions on her own behalf.  5. A trial Fibrillation: Monitor HR with bid checks. Continue amiodarone 400 mg bid with metoprolol 25 mg bid.  6. Hyponatremia/ Hypokalemia: Has been getting IV lasix on bid basis. Will add K+ supplement. Will follow up on electrolyte abnormalities in am.  8. DM type 2: Monitor BS with ac/hs cbg checks. Resumed  metformin. Good control so far 9. Chronic mixed CHF: Recent admission for overload 08/15-08/20/14.  Now with Left > Right pleural effusions--- continue po lasix. Check daily weights. Low salt diet.  10. +UA--enterococcus empiric cipro, culture STILL pending, LOS (Days) 4 A FACE TO FACE EVALUATION WAS PERFORMED  [name] 11/04/2012, 6:40 AM

## 2012-11-05 ENCOUNTER — Inpatient Hospital Stay (HOSPITAL_COMMUNITY): Payer: Medicare Other | Admitting: Occupational Therapy

## 2012-11-05 ENCOUNTER — Inpatient Hospital Stay (HOSPITAL_COMMUNITY): Payer: Medicare Other

## 2012-11-05 ENCOUNTER — Inpatient Hospital Stay (HOSPITAL_COMMUNITY): Payer: Medicare Other | Admitting: *Deleted

## 2012-11-05 DIAGNOSIS — I69993 Ataxia following unspecified cerebrovascular disease: Secondary | ICD-10-CM

## 2012-11-05 DIAGNOSIS — I6330 Cerebral infarction due to thrombosis of unspecified cerebral artery: Secondary | ICD-10-CM

## 2012-11-05 LAB — URINE CULTURE: Colony Count: >=100000

## 2012-11-05 LAB — GLUCOSE, CAPILLARY: Glucose-Capillary: 146 mg/dL — ABNORMAL HIGH (ref 70–99)

## 2012-11-05 NOTE — Progress Notes (Signed)
Occupational Therapy Session Note  Patient Details  Name: [name] MRN: [id number] Date of Birth: 01-19-1919  Today's Date: 11/05/2012 Time: 1000-1100 Time Calculation (min): 60 min  Short Term Goals: Week 1:  OT Short Term Goal 1 (Week 1): Pt will peform bathing with min assist sit to stand using AE PRN. OT Short Term Goal 2 (Week 1): Pt will donn bra and shirt during UB bathing with supervision and increased time. OT Short Term Goal 3 (Week 1): Pt will locate all selfcare items placed on the left side with no more than min questioning cues. OT Short Term Goal 4 (Week 1): Pt will perform toilet transfer and toileting with no more than min assist.  Skilled Therapeutic Interventions/Progress Updates:    Pt seen for BADL retraining of B/D at sink level (pt choice) with a focus on sit to stand, standing tolerance, left side awareness, and spatial awareness. Pt did quite well this AM, only needing 1-2 cues with orienting her clothing. She was able to dress with steadying assist only for LB.  Min for transfers.  Pt worked on LUE AROM with a/arom exercises using UE ranger and light weight dowel exercises.  Pt needed frequent rest breaks. She transferred to recliner to have her legs elevated and rest.  Therapy Documentation Precautions:  Precautions Precautions: Fall Precaution Comments: L sided weakness and inattention Restrictions Weight Bearing Restrictions: No   Pain: Pain Assessment Pain Assessment: No/denies pain ADL:  See FIM for current functional status  Therapy/Group: Individual Therapy  [name] 11/05/2012, 12:32 PM

## 2012-11-05 NOTE — Progress Notes (Signed)
Physical Therapy Session Note  Patient Details  Name: [name] MRN: [id number] Date of Birth: [birth year]  Today's Date: 11/05/2012 Time: 8:32-9:30 ( ) and 14:50-15:23 ( )   Short Term Goals: Week 1:  PT Short Term Goal 1 (Week 1): Pt will perform transfers with RW at min assist PT Short Term Goal 2 (Week 1): Pt will propel w/c x 50' using BUEs or LEs as needed at supervision level PT Short Term Goal 3 (Week 1): Pt will perform dynamic standing balance at min assist PT Short Term Goal 4 (Week 1): Pt will ambulate x 50' with LRAD at mod assist  Skilled Therapeutic Interventions/Progress Updates:  1:2   Tx focused on increasing activity tolerance, strengthening, transfers, and L-sided attention tasks.   Pt up in recliner upon entering, tearfully explaining family, loss, and wish to die. Pt provided encouragement and agreeable to PT. All transfers with min A throughout tx, needing cues for controlling descent. Pt stood statically  1x2. and 1x72min for taking meds with 1UE assist and close SBA.   Gait 1x85' and 1x50' with 4WW and resting on forearms with min-guard in controlled and home environments. Pt needed increased time with turning and backing up on carpet .  Seated L-sided fine motor task clipping pins in colored order along box. This task difficult for pt, needing encouragement to continue attempting with L hand, unable to attend to far L side of box, clipping all on R side.   Up/down ramp with 4WW and min-guard A.  Pt very fatigued, requesting to be pushed back to room in [facility].   2:2 Tx focused on gait for activity tolerance and therex for generalized strengthening.  Sit>stand with Mod A Gait 1x140' with 4WW and min-guard with resting on forearms, unwilling to adjust UE position for safety. Pt c/o fatigue, but encouraged to continue.   Nustep x36min with bil UE and LEs for strengthening.   Car transfer with Min A for steadying. Pt's daughter described Zenaida Niece set-up  with a step they use. Daughter will measure seat height and bring in step, as well as pt's own WC for practice.   Pt returned to room with daughter and left with all needs in reach.   Therapy Documentation Precautions:  Precautions Precautions: Fall Precaution Comments: L sided weakness and inattention Restrictions Weight Bearing Restrictions: No   Vital Signs: Therapy Vitals Temp: 97.4 F (36.3 C) Temp src: Oral Pulse Rate: 84 Resp: 18 BP: 115/67 mmHg Patient Position, if appropriate: Lying Oxygen Therapy SpO2: 98 % Pain:  none  See FIM for current functional status  Therapy/Group: Individual Therapy  Clydene Laming, PT, DPT  11/05/2012, 9:02 AM

## 2012-11-05 NOTE — Progress Notes (Signed)
Speech Language Pathology Daily Session Note  Patient Details  Name: [name] MRN: [id number] Date of Birth: March 26, 1918  Today's Date: 11/05/2012 Time: 1100-1130 Time Calculation (min): 30 min  Short Term Goals: Week 1: SLP Short Term Goal 1 (Week 1): Pt will demonstrate functional problem solving for mildly complex tasks with supervision verbal cues. SLP Short Term Goal 2 (Week 1): Pt will attend to left field of enviornment with functional tasks with supervision verbal & question cues.  SLP Short Term Goal 3 (Week 1): Pt will utilize external memory aids to recall new, daily information with Mod I.  SLP Short Term Goal 4 (Week 1): Pt will identify 1 physical and 1 cognitive task with supervision question cues.  SLP Short Term Goal 5 (Week 1): Pt will initiate functional tasks with Mod I.   Skilled Therapeutic Interventions: Treatment focused on cognitive goals and pt/family education. SLP facilitated session with family education with daughter Olegario Messier regarding medication management upon return home. Will plan to address medication management in future treatment sessions prior to discharge, as pt was managing this herself at home prior to admission. Pt was fatigued from previous sessions and required moderate words of encouragement to participate in word search activity, saying that she "just wants to die." Pt participated in activity with Max cues to look for words in her left visual field, which she would then find with Mod I. Pt was returned to bed at the conclusion of treatment.   FIM:  Comprehension Comprehension Mode: Auditory Comprehension: 5-Understands basic 90% of the time/requires cueing < 10% of the time Expression Expression Mode: Verbal Expression: 5-Expresses basic needs/ideas: With extra time/assistive device Social Interaction Social Interaction: 4-Interacts appropriately 75 - 89% of the time - Needs redirection for appropriate language or to initiate  interaction. Problem Solving Problem Solving: 4-Solves basic 75 - 89% of the time/requires cueing 10 - 24% of the time Memory Memory: 4-Recognizes or recalls 75 - 89% of the time/requires cueing 10 - 24% of the time FIM - Eating Eating Activity: 5: Needs verbal cues/supervision;5: Set-up assist for open containers  Pain Pain Assessment Pain Assessment: No/denies pain  Therapy/Group: Individual Therapy  Maxcine Ham 11/05/2012, 2:09 PM  Maxcine Ham, M.A. CCC-SLP [phone]

## 2012-11-05 NOTE — Progress Notes (Signed)
Patient ID: [name], female   DOB: 13-Nov-1918, 79 y.o.   MRN: [id number] Subjective/Complaints: 79 y.o. female with history of DM, HTN, A Fib; who was admitted on 10/26/12 with left sided weakness and slurred speech. CT with question of early ischemic changes in right insula and patient treated with TPA. Patient with seizure episode with hypoxia later that evening requiring intubation. Started on Keppra. Follow up CT head revealed evolving right MCA infarcts 4.6 X 3.8 cm. 2 D echo with EF 55-65% and no wall abnormality, moderate to severe AVR and MVR. Carotid dopplers difficult to visualize due to movement. Neurology recommends plavix for CVA prophylaxis--but to discuss NOAC with Dr. Eldridge Dace due to patient's history of Afib   No new issues overnite  Review of Systems  HENT: Positive for hearing loss.   All other systems reviewed and are negative.   Objective: Vital Signs: Blood pressure 141/80, pulse 82, temperature 97.4 F (36.3 C), temperature source Oral, resp. rate 18, height 5\' 2"  (1.575 m), weight 84.6 kg (186 lb 8.2 oz), SpO2 98.00%. No results found. Results for orders placed during the hospital encounter of 10/31/12 (from the past 72 hour(s))  GLUCOSE, CAPILLARY     Status: Abnormal   Collection Time    11/02/12 11:22 AM      Result Value Range   Glucose-Capillary 139 (*) 70 - 99 mg/dL   Comment 1 Notify RN    GLUCOSE, CAPILLARY     Status: Abnormal   Collection Time    11/02/12  4:35 PM      Result Value Range   Glucose-Capillary 119 (*) 70 - 99 mg/dL   Comment 1 Notify RN    GLUCOSE, CAPILLARY     Status: Abnormal   Collection Time    11/02/12  8:31 PM      Result Value Range   Glucose-Capillary 122 (*) 70 - 99 mg/dL  GLUCOSE, CAPILLARY     Status: Abnormal   Collection Time    11/03/12  7:12 AM      Result Value Range   Glucose-Capillary 109 (*) 70 - 99 mg/dL   Comment 1 Notify RN    GLUCOSE, CAPILLARY     Status: Abnormal   Collection Time    11/03/12  11:28 AM      Result Value Range   Glucose-Capillary 145 (*) 70 - 99 mg/dL   Comment 1 Notify RN    GLUCOSE, CAPILLARY     Status: None   Collection Time    11/03/12  4:25 PM      Result Value Range   Glucose-Capillary 96  70 - 99 mg/dL   Comment 1 Notify RN    GLUCOSE, CAPILLARY     Status: Abnormal   Collection Time    11/03/12  8:44 PM      Result Value Range   Glucose-Capillary 103 (*) 70 - 99 mg/dL  BASIC METABOLIC PANEL     Status: Abnormal   Collection Time    11/04/12  6:25 AM      Result Value Range   Sodium 134 (*) 135 - 145 mEq/L   Potassium 4.2  3.5 - 5.1 mEq/L   Chloride 95 (*) 96 - 112 mEq/L   CO2 23  19 - 32 mEq/L   Glucose, Bld 103 (*) 70 - 99 mg/dL   BUN 19  6 - 23 mg/dL   Creatinine, Ser 4.09  0.50 - 1.10 mg/dL   Calcium 8.3 (*) 8.4 - 10.5  mg/dL   GFR calc non Af Amer 45 (*) >90 mL/min   GFR calc Af Amer 52 (*) >90 mL/min   Comment: (NOTE)     The eGFR has been calculated using the CKD EPI equation.     This calculation has not been validated in all clinical situations.     eGFR's persistently <90 mL/min signify possible Chronic Kidney     Disease.  GLUCOSE, CAPILLARY     Status: Abnormal   Collection Time    11/04/12  7:21 AM      Result Value Range   Glucose-Capillary 105 (*) 70 - 99 mg/dL  GLUCOSE, CAPILLARY     Status: Abnormal   Collection Time    11/04/12 11:09 AM      Result Value Range   Glucose-Capillary 167 (*) 70 - 99 mg/dL   Comment 1 Notify RN    GLUCOSE, CAPILLARY     Status: Abnormal   Collection Time    11/04/12  4:38 PM      Result Value Range   Glucose-Capillary 107 (*) 70 - 99 mg/dL   Comment 1 Notify RN    GLUCOSE, CAPILLARY     Status: Abnormal   Collection Time    11/04/12  8:27 PM      Result Value Range   Glucose-Capillary 106 (*) 70 - 99 mg/dL  GLUCOSE, CAPILLARY     Status: None   Collection Time    11/05/12  7:36 AM      Result Value Range   Glucose-Capillary 98  70 - 99 mg/dL   Comment 1 Notify RN         Nursing note and vitals reviewed.  Constitutional: She is oriented to person, place, and time. She appears well-developed and well-nourished.  HENT:  Head: Normocephalic and atraumatic.  Eyes: Conjunctivae are normal. Pupils are equal, round, and reactive to light.  Neck: Normal range of motion. Neck supple.  Cardiovascular: Normal rate. An irregularly irregular rhythm present.  Pulmonary/Chest: No respiratory distress. She has decreased breath sounds in the right lower field and the left lower field. She has no wheezes.  Abdominal: Soft. Bowel sounds are normal. She exhibits no distension. There is no tenderness.  Musculoskeletal: She exhibits no edema. Left knee with a small effusion Discomfort and decreased ROM bilateral knees  Neurological: She is alert and oriented to person, place, and time.  Follows commands without difficulty. Speech clear. LUE with pronator drift. Left sided weakness, 3/5 Delt, bi, tri, grip 4/5 L HF KE, 3-/5 ADF/PF , effort inconsistent however. Has grossly intact sense to pain and light touch. 4/5 R side except 3- HF Cognitively fairly intact. Is very HOH  Skin: Skin is warm and dry. No erythema.  Diffuse ecchymosis left forearm and petechial area on BLE.  Psychiatric: She has a normal mood and affect. Her behavior is normal. Thought content normal.    Assessment/Plan: 1. Functional deficits secondary to R MCA infarct which require 3+ hours per day of interdisciplinary therapy in a comprehensive inpatient rehab setting. Physiatrist is providing close team supervision and 24 hour management of active medical problems listed below. Physiatrist and rehab team continue to assess barriers to discharge/monitor patient progress toward functional and medical goals.    FIM: FIM - Bathing Bathing Steps Patient Completed: Chest;Right Arm;Left Arm;Abdomen;Front perineal area;Buttocks;Right upper leg;Left upper leg;Right lower leg (including foot);Left lower leg  (including foot) Bathing: 4: Steadying assist  FIM - Upper Body Dressing/Undressing Upper body dressing/undressing steps patient  completed: Thread/unthread right bra strap;Thread/unthread right sleeve of pullover shirt/dresss;Thread/unthread left sleeve of pullover shirt/dress;Put head through opening of pull over shirt/dress;Pull shirt over trunk Upper body dressing/undressing: 4: Min-Patient completed 75 plus % of tasks FIM - Lower Body Dressing/Undressing Lower body dressing/undressing steps patient completed: Thread/unthread right underwear leg;Thread/unthread left underwear leg;Pull underwear up/down;Thread/unthread right pants leg;Thread/unthread left pants leg;Pull pants up/down Lower body dressing/undressing: 4: Min-Patient completed 75 plus % of tasks  FIM - Toileting Toileting steps completed by patient: Adjust clothing prior to toileting;Performs perineal hygiene;Adjust clothing after toileting Toileting Assistive Devices: Grab bar or rail for support Toileting: 5: Supervision: Safety issues/verbal cues  FIM - Diplomatic Services operational officer Devices: Elevated toilet seat;Grab bars;Walker Toilet Transfers: 4-To toilet/BSC: Min A (steadying Pt. > 75%)  FIM - Bed/Chair Transfer Bed/Chair Transfer Assistive Devices: Therapist, occupational: 4: Sit > Supine: Min A (steadying pt. > 75%/lift 1 leg);4: Chair or W/C > Bed: Min A (steadying Pt. > 75%)  FIM - Locomotion: Wheelchair Locomotion: Wheelchair: 1: Travels less than 50 ft with supervision, cueing or coaxing FIM - Locomotion: Ambulation Locomotion: Ambulation Assistive Devices: Designer, industrial/product Ambulation/Gait Assistance: 4: Min assist;4: Min guard Locomotion: Ambulation: 2: Travels 50 - 149 ft with minimal assistance (Pt.>75%)  Comprehension Comprehension Mode: Auditory Comprehension: 4-Understands basic 75 - 89% of the time/requires cueing 10 - 24% of the time  Expression Expression Mode:  Verbal Expression: 5-Expresses basic needs/ideas: With extra time/assistive device  Social Interaction Social Interaction: 4-Interacts appropriately 75 - 89% of the time - Needs redirection for appropriate language or to initiate interaction.  Problem Solving Problem Solving: 4-Solves basic 75 - 89% of the time/requires cueing 10 - 24% of the time  Memory Memory: 4-Recognizes or recalls 75 - 89% of the time/requires cueing 10 - 24% of the time  Medical Problem List and Plan:  1. DVT Prophylaxis/Anticoagulation: Pharmaceutical: Other (comment)--Eliquis  2. Pain Management: continue voltaren gel to help with chronic knee, utilize ice as well 3. Mood: Provide ego support regarding life changes. Patient would like to be able to go back to her own home. Will have LCSW follow for evaluation and support.  4. Neuropsych: This patient is capable of making decisions on her own behalf.  5. A trial Fibrillation: Monitor HR with bid checks. Continue amiodarone 400 mg bid with metoprolol 25 mg bid.  6. Hyponatremia/ Hypokalemia: Has been getting po lasix on bid basis. Will add K+ supplement. 8. DM type 2: Monitor BS with ac/hs cbg checks. Resumed metformin. Good control so far 9. Chronic mixed CHF: Recent admission for overload 08/15-08/20/14. Now with Left > Right pleural effusions--- continue po lasix. Check daily weights. Low salt diet.  10. +UA--enterococcus empiric cipro, culture STILL pending, LOS (Days) 5 A FACE TO FACE EVALUATION WAS PERFORMED  KIRSTEINS,ANDREW E 11/05/2012, 8:05 AM

## 2012-11-06 ENCOUNTER — Inpatient Hospital Stay (HOSPITAL_COMMUNITY): Payer: Medicare Other

## 2012-11-06 ENCOUNTER — Inpatient Hospital Stay (HOSPITAL_COMMUNITY): Payer: Medicare Other | Admitting: Physical Therapy

## 2012-11-06 ENCOUNTER — Inpatient Hospital Stay (HOSPITAL_COMMUNITY): Payer: Medicare Other | Admitting: Occupational Therapy

## 2012-11-06 LAB — GLUCOSE, CAPILLARY
Glucose-Capillary: 121 mg/dL — ABNORMAL HIGH (ref 70–99)
Glucose-Capillary: 116 mg/dL — ABNORMAL HIGH (ref 70–99)

## 2012-11-06 MED ORDER — SENNA 8.6 MG PO TABS: 1.0000 | ORAL_TABLET | Freq: Every evening | ORAL | Status: DC | PRN

## 2012-11-06 MED ORDER — FLEET ENEMA 7-19 GM/118ML RE ENEM: 1.0000 | ENEMA | Freq: Every day | RECTAL | Status: DC | PRN

## 2012-11-06 MED ORDER — BISACODYL 10 MG RE SUPP: 10.0000 mg | Freq: Every day | RECTAL | Status: DC | PRN

## 2012-11-06 NOTE — Progress Notes (Signed)
Social Work Patient ID: [name], female   DOB: 01-31-1919, 79 y.o.   MRN: [id number] Met with pt and daughter separately to discuss team conference goals-supervision level.  Daughter feels the longer pt stays here the worse she is getting mental health wise. They want to do education and have her go home by Friday.  Daughter is staying here with her and will provide care.  She and brother feel she will either rally at home or give up. Will have daughter attend therapies with pt and arrange DME and follow up.  Work toward Friday. Pam-PA aware and will discuss with team and MD.

## 2012-11-06 NOTE — Progress Notes (Signed)
Physical Therapy Note  Patient Details  Name: Mystic Labo MRN: [id number] Date of Birth: Apr 09, 1918 Today's Date: 11/06/2012  Pt sound asleep with daughter declining PT at this time.  Pt missed 45 mins of PT today.     Kortland Nichols, Alison Murray 11/06/2012, 1:18 PM

## 2012-11-06 NOTE — Patient Care Conference (Signed)
Inpatient RehabilitationTeam Conference and Plan of Care Update Date: 11/06/2012   Time: 12;00 pm    Patient Name: Tehilla Coffel      Medical Record Number: [id number]  Date of Birth: 27-Nov-1918 Sex: Female         Room/Bed: 4M04C/4M04C-01 Payor Info: Payor: Advertising copywriter MEDICARE / Plan: A[employer] MEDICARE COMPLETE / Product Type: *No Product type* /    Admitting Diagnosis: RT CVA  Admit Date/Time:  10/31/2012  3:52 PM Admission Comments: No comment available   Primary Diagnosis:  Cerebral embolism with cerebral infarction Principal Problem: Cerebral embolism with cerebral infarction  Patient Active Problem List   Diagnosis Date Noted  . Enterococcus UTI 11/07/2012  . Cerebral embolism with cerebral infarction 10/26/2012  . Seizure 10/26/2012  . Acute respiratory failure 10/26/2012  . Intracranial hematoma 10/26/2012  . Acute combined systolic and diastolic congestive heart failure 10/12/2012  . Hyponatremia 10/12/2012  . A-fib 10/12/2012  . Diabetes 10/12/2012  . Hyperlipidemia 10/12/2012  . Atherosclerosis of native arteries of the extremities with gangrene 04/10/2011  . Swelling of limb 04/10/2011    Expected Discharge Date: Expected Discharge Date: 11/08/12  Team Members Present: Physician leading conference: Dr. Claudette Laws Social Worker Present: Staci Acosta, LCSW;Becky Diany Formosa, LCSW Nurse Present: Kennon Portela, RN PT Present: Edman Circle, PT;Bridgett Ripa, Clearnce Hasten, PT OT Present: Rosalio Loud, OT;Kris Gellert, Heath Lark, OT SLP Present: Fae Pippin, Charlie Pitter, SLP PPS Coordinator present : Edson Snowball, Chapman Fitch, RN, CRRN     Current Status/Progress Goal Weekly Team Focus  Medical   No pain complaints, hard of hearing, bowel and bladder intact  Maintain medical stability  See above, monitor fluid intake and output   Bowel/Bladder   cont bowel and bladder; uses toilet  remain cont bowel and bladder  monitor for  constipation/diarrhea   Swallow/Nutrition/ Hydration     [facility]        ADL's   min assist overall  supervision overall  activity tolerance, Left side awareness, LUE strength and functional use, functional mobility, pt/family education   Mobility     min level   Supervision-overall min steps   Family education and work on tolerance  Communication     [facility]        Safety/Cognition/ Behavioral Observations  Min cues for complex problem solving  Supervision   Supervision   Pain   B knee arthritis relieved by scheduled Voltaren gel  pain controlled <3  assess pain qshift and prn   Skin   buttock red and excoriated, protective barrier cream applied; scattered bruising   no infection or new skin breakdown while in rehab  assess skin qshift and prn; avoid sleeping on back to relieve pressure and decrease moisture       *See Care Plan and progress notes for long and short-term goals.  Barriers to Discharge: Needs physical assistance    Possible Resolutions to Barriers:  Identify caregiver post discharge    Discharge Planning/Teaching Needs:  HOme with son assisting and daughter who is still here from [place].  SOn works fulltime,need to come up with a plan      Team Discussion:  Too much therapy for pt decreased to 15/7.  Arthritis knee issues.  Daughter here and observing in therapies  Revisions to Treatment Plan:  Daughter and pt want d/c moved up feel pt's mental health more important-team aware   Continued Need for Acute Rehabilitation Level of Care: The patient requires daily medical management by a physician with  specialized training in physical medicine and rehabilitation for the following conditions: Daily direction of a multidisciplinary physical rehabilitation program to ensure safe treatment while eliciting the highest outcome that is of practical value to the patient.: Yes Daily medical management of patient stability for increased activity during participation in an intensive  rehabilitation regime.: Yes Daily analysis of laboratory values and/or radiology reports with any subsequent need for medication adjustment of medical intervention for : Neurological problems;Cardiac problems  Carmella Kees, Lemar Livings 11/08/2012, 9:02 AM

## 2012-11-06 NOTE — Progress Notes (Signed)
Patient ID: [name], female   DOB: [birth year], 79 y.o.   MRN: [id number] Subjective/Complaints: 79 y.o. female with history of DM, HTN, A Fib; who was admitted on 10/26/12 with left sided weakness and slurred speech. CT with question of early ischemic changes in right insula and patient treated with TPA. Patient with seizure episode with hypoxia later that evening requiring intubation. Started on Keppra. Follow up CT head revealed evolving right MCA infarcts 4.6 X 3.8 cm. 2 D echo with EF 55-65% and no wall abnormality, moderate to severe AVR and MVR. Carotid dopplers difficult to visualize due to movement. Neurology recommends plavix for CVA prophylaxis--but to discuss NOAC with Dr. Eldridge Dace due to patient's history of Afib   Awake this am. "i need to go to the bathroom.". Denies other issues  Review of Systems  HENT: Positive for hearing loss.   All other systems reviewed and are negative.   Objective: Vital Signs: Blood pressure 119/73, pulse 111, temperature 98.3 F (36.8 C), temperature source Oral, resp. rate 20, height 5\' 2"  (1.575 m), weight 84.6 kg (186 lb 8.2 oz), SpO2 96.00%. No results found. Results for orders placed during the hospital encounter of 10/31/12 (from the past 72 hour(s))  GLUCOSE, CAPILLARY     Status: Abnormal   Collection Time    11/03/12 11:28 AM      Result Value Range   Glucose-Capillary 145 (*) 70 - 99 mg/dL   Comment 1 Notify RN    GLUCOSE, CAPILLARY     Status: None   Collection Time    11/03/12  4:25 PM      Result Value Range   Glucose-Capillary 96  70 - 99 mg/dL   Comment 1 Notify RN    GLUCOSE, CAPILLARY     Status: Abnormal   Collection Time    11/03/12  8:44 PM      Result Value Range   Glucose-Capillary 103 (*) 70 - 99 mg/dL  BASIC METABOLIC PANEL     Status: Abnormal   Collection Time    11/04/12  6:25 AM      Result Value Range   Sodium 134 (*) 135 - 145 mEq/L   Potassium 4.2  3.5 - 5.1 mEq/L   Chloride 95 (*) 96 - 112 mEq/L    CO2 23  19 - 32 mEq/L   Glucose, Bld 103 (*) 70 - 99 mg/dL   BUN 19  6 - 23 mg/dL   Creatinine, Ser 4.09  0.50 - 1.10 mg/dL   Calcium 8.3 (*) 8.4 - 10.5 mg/dL   GFR calc non Af Amer 45 (*) >90 mL/min   GFR calc Af Amer 52 (*) >90 mL/min   Comment: (NOTE)     The eGFR has been calculated using the CKD EPI equation.     This calculation has not been validated in all clinical situations.     eGFR's persistently <90 mL/min signify possible Chronic Kidney     Disease.  GLUCOSE, CAPILLARY     Status: Abnormal   Collection Time    11/04/12  7:21 AM      Result Value Range   Glucose-Capillary 105 (*) 70 - 99 mg/dL  GLUCOSE, CAPILLARY     Status: Abnormal   Collection Time    11/04/12 11:09 AM      Result Value Range   Glucose-Capillary 167 (*) 70 - 99 mg/dL   Comment 1 Notify RN    GLUCOSE, CAPILLARY     Status: Abnormal  Collection Time    11/04/12  4:38 PM      Result Value Range   Glucose-Capillary 107 (*) 70 - 99 mg/dL   Comment 1 Notify RN    GLUCOSE, CAPILLARY     Status: Abnormal   Collection Time    11/04/12  8:27 PM      Result Value Range   Glucose-Capillary 106 (*) 70 - 99 mg/dL  GLUCOSE, CAPILLARY     Status: None   Collection Time    11/05/12  7:36 AM      Result Value Range   Glucose-Capillary 98  70 - 99 mg/dL   Comment 1 Notify RN    GLUCOSE, CAPILLARY     Status: Abnormal   Collection Time    11/05/12 11:35 AM      Result Value Range   Glucose-Capillary 161 (*) 70 - 99 mg/dL   Comment 1 Notify RN    GLUCOSE, CAPILLARY     Status: Abnormal   Collection Time    11/05/12  4:27 PM      Result Value Range   Glucose-Capillary 114 (*) 70 - 99 mg/dL   Comment 1 Notify RN    GLUCOSE, CAPILLARY     Status: Abnormal   Collection Time    11/05/12  8:18 PM      Result Value Range   Glucose-Capillary 146 (*) 70 - 99 mg/dL  GLUCOSE, CAPILLARY     Status: Abnormal   Collection Time    11/06/12  7:33 AM      Result Value Range   Glucose-Capillary 121 (*) 70 -  99 mg/dL   Comment 1 Notify RN        Nursing note and vitals reviewed.  Constitutional: She is oriented to person, place, and time. She appears well-developed and well-nourished.  HENT:  Head: Normocephalic and atraumatic.  Eyes: Conjunctivae are normal. Pupils are equal, round, and reactive to light.  Neck: Normal range of motion. Neck supple.  Cardiovascular: Normal rate. An irregularly irregular rhythm present.  Pulmonary/Chest: No respiratory distress. She has decreased breath sounds in the right lower field and the left lower field. She has no wheezes.  Abdominal: Soft. Bowel sounds are normal. She exhibits no distension. There is no tenderness.  Musculoskeletal: She exhibits no edema. Left knee with a small effusion Discomfort and decreased ROM bilateral knees  Neurological: She is alert and oriented to person, place, and time.  Follows commands without difficulty. Speech clear. LUE with pronator drift. Left sided weakness, 3/5 Delt, bi, tri, grip 4/5 L HF KE, 3-/5 ADF/PF , effort inconsistent however. Has grossly intact sense to pain and light touch. 4/5 R side except 3- HF Cognitively fairly intact. Is very HOH  Skin: Skin is warm and dry. No erythema.  Diffuse ecchymosis left forearm and petechial area on BLE.  Psychiatric: She has a normal mood and affect. Her behavior is normal. Thought content normal.    Assessment/Plan: 1. Functional deficits secondary to R MCA infarct which require 3+ hours per day of interdisciplinary therapy in a comprehensive inpatient rehab setting. Physiatrist is providing close team supervision and 24 hour management of active medical problems listed below. Physiatrist and rehab team continue to assess barriers to discharge/monitor patient progress toward functional and medical goals.  Team conference today please see physician documentation under team conference tab, met with team face-to-face to discuss problems,progress, and goals. Formulized  individual treatment plan based on medical history, underlying problem and comorbidities.  FIM:  FIM - Bathing Bathing Steps Patient Completed: Chest;Right Arm;Left Arm;Abdomen;Front perineal area;Buttocks;Right upper leg;Left upper leg;Right lower leg (including foot);Left lower leg (including foot) Bathing: 4: Steadying assist  FIM - Upper Body Dressing/Undressing Upper body dressing/undressing steps patient completed: Thread/unthread right bra strap;Thread/unthread left bra strap;Thread/unthread right sleeve of pullover shirt/dresss;Thread/unthread left sleeve of pullover shirt/dress;Put head through opening of pull over shirt/dress;Pull shirt over trunk Upper body dressing/undressing: 4: Min-Patient completed 75 plus % of tasks (min to adjust bra in back) FIM - Lower Body Dressing/Undressing Lower body dressing/undressing steps patient completed: Thread/unthread right underwear leg;Thread/unthread left underwear leg;Pull underwear up/down;Thread/unthread right pants leg;Thread/unthread left pants leg;Pull pants up/down;Don/Doff right shoe;Don/Doff left shoe Lower body dressing/undressing: 4: Steadying Assist  FIM - Toileting Toileting steps completed by patient: Adjust clothing prior to toileting;Performs perineal hygiene;Adjust clothing after toileting Toileting Assistive Devices: Grab bar or rail for support Toileting: 5: Supervision: Safety issues/verbal cues  FIM - Diplomatic Services operational officer Devices: Elevated toilet seat;Grab bars;Walker Toilet Transfers: 4-To toilet/BSC: Min A (steadying Pt. > 75%)  FIM - Banker Devices: Walker;Arm rests Bed/Chair Transfer: 4: Bed > Chair or W/C: Min A (steadying Pt. > 75%);4: Chair or W/C > Bed: Min A (steadying Pt. > 75%)  FIM - Locomotion: Wheelchair Locomotion: Wheelchair: 1: Total Assistance/staff pushes wheelchair (Pt<25%) FIM - Locomotion: Ambulation Locomotion: Ambulation  Assistive Devices: Designer, industrial/product Ambulation/Gait Assistance: 4: Min guard Locomotion: Ambulation: 2: Travels 50 - 149 ft with minimal assistance (Pt.>75%)  Comprehension Comprehension Mode: Auditory Comprehension: 4-Understands basic 75 - 89% of the time/requires cueing 10 - 24% of the time  Expression Expression Mode: Verbal Expression: 5-Expresses basic needs/ideas: With extra time/assistive device  Social Interaction Social Interaction: 4-Interacts appropriately 75 - 89% of the time - Needs redirection for appropriate language or to initiate interaction.  Problem Solving Problem Solving: 4-Solves basic 75 - 89% of the time/requires cueing 10 - 24% of the time  Memory Memory: 4-Recognizes or recalls 75 - 89% of the time/requires cueing 10 - 24% of the time  Medical Problem List and Plan:  1. DVT Prophylaxis/Anticoagulation: Pharmaceutical: Other (comment)--Eliquis  2. Pain Management: continue voltaren gel to help with chronic knee, utilize ice as well 3. Mood: Provide ego support regarding life changes. Patient would like to be able to go back to her own home. Will have LCSW follow for evaluation and support.  4. Neuropsych: This patient is capable of making decisions on her own behalf.  5. A trial Fibrillation: Monitor HR with bid checks. Continue amiodarone 400 mg bid with metoprolol 25 mg bid.  6. Hyponatremia/ Hypokalemia: Has been getting IV lasix on bid basis. Will add K+ supplement. Will follow up on electrolyte abnormalities in am.  8. DM type 2: Monitor BS with ac/hs cbg checks. Resumed metformin. Good control so far 9. Chronic mixed CHF: Recent admission for overload 08/15-08/20/14. Now with Left > Right pleural effusions--- continue po lasix. Check daily weights. Low salt diet.  10. +UA--enterococcus empiric cipro, culture STILL pending, LOS (Days) 6 A FACE TO FACE EVALUATION WAS PERFORMED  Duaa Stelzner E 11/06/2012, 9:20 AM

## 2012-11-06 NOTE — Progress Notes (Signed)
Occupational Therapy Session Note  Patient Details  Name: [name] MRN: [id number] Date of Birth: 05-30-1918  Today's Date: 11/06/2012 Time: 1030-1130 Time Calculation (min): 60 min  Short Term Goals: Week 1:  OT Short Term Goal 1 (Week 1): Pt will peform bathing with min assist sit to stand using AE PRN. OT Short Term Goal 2 (Week 1): Pt will donn bra and shirt during UB bathing with supervision and increased time. OT Short Term Goal 3 (Week 1): Pt will locate all selfcare items placed on the left side with no more than min questioning cues. OT Short Term Goal 4 (Week 1): Pt will perform toilet transfer and toileting with no more than min assist.  Skilled Therapeutic Interventions/Progress Updates:      Pt seen for BADL retraining of toileting, bathing, and dressing with a focus on functional transfers with RW and activity tolerance. Pt did quite well today. She ambulated to toilet and then to tub bench with steady assist and completed self care with steady assist.  She did need several rest breaks and was able to perform sit to stand at least 8x during session.  In ADL apt, pt practice tub bench transfers with min assist to lift left leg into the tub.  Pt's SLP therapist had arrived for her next session.  Therapy Documentation Precautions:  Precautions Precautions: Fall Precaution Comments: L sided weakness and inattention Restrictions Weight Bearing Restrictions: No   Pain: Pain Assessment Pain Assessment: No/denies pain  ADL:  See FIM for current functional status  Therapy/Group: Individual Therapy  Onyekachi Gathright 11/06/2012, 11:56 AM

## 2012-11-06 NOTE — Progress Notes (Signed)
Physical Therapy Session Note  Patient Details  Name: [name] MRN: [id number] Date of Birth: [birth year]  Today's Date: 11/06/2012 Time: 0915-1000 Time Calculation (min): 45 min  Short Term Goals: Week 1:  PT Short Term Goal 1 (Week 1): Pt will perform transfers with RW at min assist PT Short Term Goal 2 (Week 1): Pt will propel w/c x 50' using BUEs or LEs as needed at supervision level PT Short Term Goal 3 (Week 1): Pt will perform dynamic standing balance at min assist PT Short Term Goal 4 (Week 1): Pt will ambulate x 50' with LRAD at [facility] assist  Skilled Therapeutic Interventions/Progress Updates:    pt sitting EOB with Nsg getting medications.  Pt missed of PT at the beginning of session.  Sit to stand with MinA.  Amb with 4WW 16' with MinG.  Daughter present and brought measurements of pt's Zenaida Niece and broughtin actual step and W/C from home.  Practiced car transfer with car elevated to 33" seat height from ground and used pt's 4" platform step from home.  Pt required MinA and A to hold car door as pt is used to pulling on armrest on door.  Pt with difficulty bringing feet up into car.  Trialed using 8" step, but pt unable to step up on that tall of a step.  Daughter plans to have 6" step built at home.  Had daughter A pt with sit to stands and ambulating to start incorporating family into therapy sessions.  Pt amb with MinG from daughter 10' and 50' with 4WW.  Safety cues with return to sitting as pt tends to sit prematurely.  Daughter is to find out pt's son and daughter-in-law's schedules to have them come in for family ed this week or weekend.  Daughter will also measure height of pt's bed at home.    Therapy Documentation Precautions:  Precautions Precautions: Fall Precaution Comments: L sided weakness and inattention Restrictions Weight Bearing Restrictions: No  See FIM for current functional status  Therapy/Group: Individual Therapy  [name], Alison Murray 11/06/2012,  11:16 AM

## 2012-11-06 NOTE — Progress Notes (Signed)
Speech Language Pathology Daily Session Note  Patient Details  Name: [name] MRN: [id number] Date of Birth: Sep 11, 1918  Today's Date: 11/06/2012 Time: 1130-1200 Time Calculation (min): 30 min  Short Term Goals: Week 1: SLP Short Term Goal 1 (Week 1): Pt will demonstrate functional problem solving for mildly complex tasks with supervision verbal cues. SLP Short Term Goal 2 (Week 1): Pt will attend to left field of enviornment with functional tasks with supervision verbal & question cues.  SLP Short Term Goal 3 (Week 1): Pt will utilize external memory aids to recall new, daily information with Mod I.  SLP Short Term Goal 4 (Week 1): Pt will identify 1 physical and 1 cognitive task with supervision question cues.  SLP Short Term Goal 5 (Week 1): Pt will initiate functional tasks with Mod I.   Skilled Therapeutic Interventions: Treatment focused on cognitive goals. SLP facilitated session with education regarding medication management with patient and daughter. Pt participated in medication management task utilizing her own strategies from home with Supervision level verbal cues.    FIM:  Comprehension Comprehension Mode: Auditory Comprehension: 5-Follows basic conversation/direction: With extra time/assistive device Expression Expression Mode: Verbal Expression: 5-Expresses basic needs/ideas: With extra time/assistive device Social Interaction Social Interaction: 4-Interacts appropriately 75 - 89% of the time - Needs redirection for appropriate language or to initiate interaction. Problem Solving Problem Solving: 5-Solves basic problems: With no assist Memory Memory: 5-Recognizes or recalls 90% of the time/requires cueing < 10% of the time  Pain  No/denies pain  Therapy/Group: Individual Therapy  Maxcine Ham 11/06/2012, 4:50 PM  Maxcine Ham, M.A. CCC-SLP [phone]

## 2012-11-07 ENCOUNTER — Inpatient Hospital Stay (HOSPITAL_COMMUNITY): Payer: Medicare Other

## 2012-11-07 ENCOUNTER — Inpatient Hospital Stay (HOSPITAL_COMMUNITY): Payer: Medicare Other | Admitting: Occupational Therapy

## 2012-11-07 ENCOUNTER — Encounter (HOSPITAL_COMMUNITY): Payer: Self-pay | Admitting: *Deleted

## 2012-11-07 ENCOUNTER — Inpatient Hospital Stay (HOSPITAL_COMMUNITY): Payer: Medicare Other | Admitting: Speech Pathology

## 2012-11-07 DIAGNOSIS — B952 Enterococcus as the cause of diseases classified elsewhere: Secondary | ICD-10-CM | POA: Diagnosis present

## 2012-11-07 LAB — GLUCOSE, CAPILLARY
Glucose-Capillary: 105 mg/dL — ABNORMAL HIGH (ref 70–99)
Glucose-Capillary: 115 mg/dL — ABNORMAL HIGH (ref 70–99)

## 2012-11-07 NOTE — Discharge Summary (Signed)
Physician Discharge Summary  Patient ID: [name] MRN: [id number] DOB/AGE: 1919/01/24 79 y.o.  Admit date: 10/31/2012 Discharge date: 11/11/2012  Discharge Diagnoses:  Principal Problem:   Cerebral embolism with cerebral infarction Active Problems:   Acute combined systolic and diastolic congestive heart failure   A-fib   Diabetes   Seizure   Enterococcus UTI   Discharged Condition:  Improved.   Significant Diagnostic Studies:  CXR 11/07/12 IMPRESSION:  Minimal bibasilar atelectasis and pleural effusions.  Minimal enlargement of cardiac silhouette post pacemaker.   Labs:  Basic Metabolic Panel: No results found for this basename: NA, K, CL, CO2, GLUCOSE, BUN, CREATININE, CALCIUM, MG, PHOS,  in the last 168 hours  CBC: No results found for this basename: WBC, NEUTROABS, HGB, HCT, MCV, PLT,  in the last 168 hours  CBG:  Recent Labs Lab 11/07/12 0710 11/07/12 1150 11/07/12 1648 11/07/12 2100 11/08/12 0728  GLUCAP 105* 136* 115* 124* 104*    Brief HPI:   [name] is a 79 y.o. female with history of DM, HTN, A Fib; who was admitted on 10/26/12 with left sided weakness and slurred speech. CT with question of early ischemic changes in right insula and patient treated with TPA. Patient with seizure episode with hypoxia later that evening requiring intubation. Started on Keppra. Follow up CT head revealed evolving right MCA infarcts 4.6 X 3.8 cm. 2 D echo with EF 55-65% and no wall abnormality, moderate to severe AVR and MVR. She was started on eliquis for secondary stroke prevention as well as diuretics to help with acute on chronic CHF. theapy team evaluated patient and recommended CIR.    Hospital Course: [name] was admitted to rehab 10/31/2012 for inpatient therapies to consist of PT, ST and OT at least three hours five days a week. Past admission physiatrist, therapy team and rehab RN have worked together to provide customized collaborative inpatient  rehab. She was closely monitored for signs of overload. CXR past admission showed improvement in pleural effusions therefore she was changed to po lasix and this was further decreased to daily. Heart rate has been controlled on decreased dose amiodarone. She was started on K-dur for treatment of hypokalemia. She was noted to have Enterococcus UTI at admission and was treated with 5 day course of cipro.  Diabetes was monitored on ac/hs basis and  metformin was resumed. Po intake has been improved and patient to increase metformin to tid basis at discharge.    Voltaren gel was added to help with OA/ pain in bilateral knees. Patient  felt that her OTC meds were just as effective therefore she's to resume them at discharge. Ego support has been provided by team as well as daughter to help with mood as well as participation in therapy. Daughter felt that patient would do better mentally at home and therefore discharge was moved up per family request. Family education was done with daughter who can provide care needed past discharge.  Left sided weakness is improving and she continues with left Homonymous hemiopsia with difficulty attending to left field.    Rehab course: During patient's stay in rehab weekly team conferences were held to monitor patient's progress, set goals and discuss barriers to discharge. Patient has had improvement in activity tolerance, balance, postural control, as well as ability to compensate for deficits. She is ambulating at supervision level. When fatigued she is unable to manage LE for transitional movement in bed. Speech therapy ahs addressed cognitive-linguistic goals. Patient requires supervision for functional tasks  as well as left field inattention. Occupational therapy has focused on neuromuscular reeducation of UE as well as ADL tasks and endurance. Her visual spatial skills have improved with donning clothing. She requires supervision for bathing and dressing tasks. She requires  supervision for toileting transfers 50% of time but  when fatigued  requires min assist.     Disposition:  Home  Diet:  Diabetic . Low salt.   Special Instructions: 1. Weight self daily and contact MD if weight up by 2 lbs in a day or for signs of edema/SOB 2. [facility] to provide PT, OT, ST, RN.         Future Appointments Provider Department Dept Phone   12/03/2012 11:45 AM [facility]ace[facility] [phone]   01/06/2013 10:45 AM Everette Rank, MD [facility] [phone]       Medication List    STOP taking these medications       bumetanide 1 MG tablet  Commonly known as:  BUMEX      TAKE these medications       amiodarone 400 MG tablet  Commonly known as:  PACERONE  Take 400 mg by mouth daily.     apixaban 2.5 MG Tabs tablet  Commonly known as:  ELIQUIS  Take 1 tablet (2.5 mg total) by mouth 2 (two) times daily.     diclofenac sodium 1 % Gel  Commonly known as:  VOLTAREN  Apply 2 g topically 4 (four) times daily. Apply to both knees     furosemide 40 MG tablet  Commonly known as:  LASIX  Take 1 tablet (40 mg total) by mouth daily.     levETIRAcetam 500 MG tablet  Commonly known as:  KEPPRA  Take 1 tablet (500 mg total) by mouth 2 (two) times daily.     metFORMIN 500 MG 24 hr tablet  Commonly known as:  GLUCOPHAGE-XR  Take 1 tablet (500 mg total) by mouth 3 (three) times daily after meals.     metoprolol tartrate 25 MG tablet  Commonly known as:  LOPRESSOR  Take 25 mg by mouth 2 (two) times daily.     potassium chloride SA 20 MEQ tablet  Commonly known as:  K-DUR,KLOR-CON  Take 1 tablet (20 mEq total) by mouth 2 (two) times daily.     simvastatin 40 MG tablet  Commonly known as:  ZOCOR  Take 40 mg by mouth every evening.       Follow-up Information   Follow up with Erick Colace, MD On 12/03/2012. (Be there at 11:14 for 11:45 am appointment)    Specialty:  Physical Medicine and  Rehabilitation   Contact information:   [address] [place] [address] [phone]       Follow up with Corky Crafts., MD. Call today. (for follow up)    Specialty:  Cardiology   Contact information:   [address] [place] [address] [phone]       Follow up with Gates Rigg, MD. Call today. (for follow up in 4 weeks. )    Specialties:  Neurology, Radiology   Contact information:   [address] [place] [address] [phone]       Follow up with [name], MD On 11/18/2012. (APPT: 10:00 am for post hospital follow up and check of Lytes. )    Specialty:  Family Medicine   Contact information:   [address]  [place] [place] [address] [phone]       Signed: Jacquelynn Cree 11/11/2012, 6:33 PM

## 2012-11-07 NOTE — Progress Notes (Signed)
Occupational Therapy Session Note  Patient Details  Name: [name] MRN: [id number] Date of Birth: 21-Oct-1918  Today's Date: 11/07/2012 Time: 4132-4401 Time Calculation (min): 45 min (Pt missed last 15 min due to fatigue.  She asked to lay down in bed)  Short Term Goals: Week 1:  OT Short Term Goal 1 (Week 1): Pt will peform bathing with min assist sit to stand using AE PRN. OT Short Term Goal 2 (Week 1): Pt will donn bra and shirt during UB bathing with supervision and increased time. OT Short Term Goal 3 (Week 1): Pt will locate all selfcare items placed on the left side with no more than min questioning cues. OT Short Term Goal 4 (Week 1): Pt will perform toilet transfer and toileting with no more than min assist.  Skilled Therapeutic Interventions/Progress Updates:    Pt seen for BADL retraining with family education with daughter.  Daughter led the session as she will be the main caregiver when pt returns home.  She was able to provide the appropriate setup and supervision her mother needed for self care and the min assist she occasionally needed to stand for transfers.  Her mother does not want to use a tub bench at home, even though it may be a safer transfer. Recommended they wait for home health to assess tub transfers before doing them on their own.  Pt very fatigued after 45 min and requested to go back to bed. Pt resting in bed with daughter in the room.  Therapy Documentation Precautions:  Precautions Precautions: Fall Precaution Comments: L sided weakness and inattention Restrictions Weight Bearing Restrictions: No General: General Amount of Missed OT Time (min): 15 Minutes    Pain: Pain Assessment Pain Assessment: No/denies pain ADL: See FIM for current functional status  Therapy/Group: Individual Therapy  [name] 11/07/2012, 11:44 AM

## 2012-11-07 NOTE — Progress Notes (Signed)
Social Work Patient ID: [name], female   DOB: February 29, 1918, 79 y.o.   MRN: [id number] Met with pt and daughter to see how doing today.  Daughter is learning pt';s care in anticipation of discharge tomorrow. Discussed needs, want to wait on tub bench and let home health therapist look at, has all other DME.  Agreeable to home health Follow up and referral made for this.  Both pleased regarding discharge tomorrow.

## 2012-11-07 NOTE — Progress Notes (Signed)
Physical Therapy Discharge Summary  Patient Details  Name: Rakesha Dalporto MRN: [id number] Date of Birth: 13-Jul-1918  Today's Date: 11/07/2012  Patient has met 8 of 9 long term goals due to improved activity tolerance, improved postural control, increased strength, ability to compensate for deficits, functional use of  left upper extremity and left lower extremity, improved attention and improved awareness.  Patient to discharge at an ambulatory level Supervision.   Patient's care partner is independent to provide the necessary cognitive assistance at discharge.  Reasons goals not met: when fatigued, pt is unable to manage her LEs to move sit> supine in bed. Discharge was moved up due to pt's depressed mood about condition,  and her strong desire to return home.  Recommendation:  Patient will benefit from ongoing skilled PT services in home health setting to continue to advance safe functional mobility, address ongoing impairments in strength, balance, activity tolerance, postural alignment, memory, and minimize fall risk.  Equipment: No equipment provided  Reasons for discharge: treatment goals met and discharge from hospital  Patient/family agrees with progress made and goals achieved: Yes  PT Discharge Precautions/Restrictions Precautions Precautions: Fall Precaution Comments: bil knee OA, R>L Restrictions Weight Bearing Restrictions: No Vital Signs Therapy Vitals Temp: 97.7 F (36.5 C) Temp src: Oral Pulse Rate: 97 Resp: 18 BP: 123/69 mmHg Patient Position, if appropriate: Lying Oxygen Therapy SpO2: 97 % O2 Device: None (Room air) Pain Pain Assessment Pain Assessment: No/denies pain    Cognition Overall Cognitive Status: Impaired/Different from baseline Arousal/Alertness: Awake/alert Orientation Level: Oriented X4 Focused Attention: Appears intact Sustained Attention: Appears intact Memory: Impaired Memory Impairment: Decreased recall of new  information;Decreased short term memory Awareness: Impaired Problem Solving: Impaired Problem Solving Impairment: Functional complex Executive Function: Reasoning;Organizing;Self Monitoring;Self Correcting Reasoning: Impaired Reasoning Impairment: Functional complex Organizing: Impaired Organizing Impairment: Functional complex Self Monitoring: Impaired Self Monitoring Impairment: Functional complex Self Correcting: Impaired Self Correcting Impairment: Functional complex Behaviors: Poor frustration tolerance Safety/Judgment: Impaired Sensation Sensation Light Touch: Appears Intact Proprioception: Appears Intact Coordination Heel Shin Test: decreased coordination RLe due to R knee pain/weakness Motor  Motor Motor: Abnormal postural alignment and control;Hemiplegia Motor - Skilled Clinical Observations: Pt with flexed posture in standing.  Mobility Bed Mobility Bed Mobility: Rolling Right;Sit to Sidelying Left;Scooting to [facility];Sit to Supine Rolling Left: 6: Modified independent (Device/Increase time) Left Sidelying to Sit: 5: Supervision Sit to Supine: 4: Min assist Transfers Transfers: Yes Stand Pivot Transfers: 5: Supervision Locomotion  Ambulation Ambulation: Yes Ambulation/Gait Assistance: 5: Supervision Ambulation Distance (Feet): 150 Feet Assistive device: 4-wheeled walker Gait Gait: Yes Gait Pattern: Impaired Gait Pattern: Trunk flexed;Step-through pattern (pt leans forearms fully onto arms of 4WW; old habit) Stairs / Additional Locomotion Stairs: No Ramp: 5: Supervision Curb: Other (comment) (pt refused to attempt 4" high curb) Corporate treasurer: Yes Wheelchair Assistance: 5: Supervision Distance: 150  Trunk/Postural Assessment  Lumbar Strength Overall Lumbar Strength Comments: Pt with lumbar kyphosis in standing. Postural Control Postural Limitations: Pt with very kyphotic posture with forward head.  Pts daughter states that this is  the way she has walked for many years.    Balance Static Sitting Balance Static Sitting - Level of Assistance: 7: Independent Static Standing Balance Static Standing - Level of Assistance: 5: Stand by assistance Extremity Assessment      RLE Strength RLE Overall Strength: Deficits;Due to pain;Due to premorbid status RLE Overall Strength Comments: hip flexion 4/5; knee ext/flex 4-/5, ankle DF 4+/5, PF 5/5 all done in sitting LLE Strength LLE Overall Strength:  Deficits;Due to pain;Due to premorbid status LLE Overall Strength Comments: hip flex 4/5, knee ext 4/5, knee flex 4-/5, ankle DF 4,,                          /5, ankle PF 4+/5 all in sitting  See FIM for current functional status  Nashid Pellum 11/07/2012, 5:00 PM

## 2012-11-07 NOTE — Progress Notes (Signed)
Speech Language Pathology Session Note & Discharge Summary   Patient Details  Name: [name] MRN: [id number] Date of Birth: 1918-05-20  Today's Date: 11/07/2012 Time: 1430-1500 Time Calculation (min): 30 min  Skilled Therapeutic Intervention: Treatment focus on pt/family education in regards to current cognitive function and strategies to utilize at home to increase safety, working memory, problem solving and attention to left field on environment. Pt's daughter verbalized understanding and handouts were given to reinforce information.   Patient has met 3 of 3 long term goals.  Patient to discharge at overall Supervision level.   Reasons goals not met: N/A   Clinical Impression/Discharge Summary: Pt has made functional gains and has met 3 of 3 LTG's this admission due to improvements in the areas of working memory, awareness and problem solving. Pt will discharge home with 24 hour assistance and will require supervision cues to complete functional tasks. Pt/family education complete and pt would benefit from f/u home health SLP services to maximize cognitive function and overall functional independence.   Care Partner:  Caregiver Able to Provide Assistance: Yes  Type of Caregiver Assistance: Physical;Cognitive  Recommendation:  24 hour supervision/assistance;Home Health SLP  Rationale for SLP Follow Up: Maximize cognitive function and independence;Reduce caregiver burden   Equipment: N/A   Reasons for discharge: Discharged from hospital;Treatment goals met   Patient/Family Agrees with Progress Made and Goals Achieved: Yes   See FIM for current functional status  [name] 11/07/2012, 4:43 PM

## 2012-11-07 NOTE — Progress Notes (Signed)
Occupational Therapy Discharge Summary  Patient Details  Name: [name] MRN: [id number] Date of Birth: 1918/11/19  Today's Date: 11/07/2012  Patient has met 9 of 10 long term goals due to improved activity tolerance, improved balance, postural control, ability to compensate for deficits, functional use of  LEFT upper extremity, improved attention, improved awareness and improved coordination.  Patient to discharge at overall Supervision for self care and a supervision/ min A level for ADL transfers. Patient's care partner is independent to provide the necessary physical and cognitive assistance at discharge.    Reasons goals not met: Pt had a goal of toilet transfers with supervision. She can do these transfers with supervision 50% of the time, but when she is fatigued she needs min assist with sit to stand.  Recommendation:  Patient will benefit from ongoing skilled OT services in home health setting to continue to advance functional skills in the area of BADL.  Equipment: No equipment provided  Reasons for discharge: treatment goals met  Patient/family agrees with progress made and goals achieved: Yes  OT Discharge ADL  supervision overall Vision/Perception  Vision - History Baseline Vision: Wears glasses only for reading Vision - Assessment Eye Alignment: Within Functional Limits Ocular Range of Motion: Within Functional Limits Alignment/Gaze Preference: Within Defined Limits Tracking/Visual Pursuits: Decreased smoothness of horizontal tracking;Decreased smoothness of vertical tracking Visual Fields: Left homonymous hemianopsia Perception Inattention/Neglect: Does not attend to left visual field Spatial Orientation: visual spatial skills have improved with donning clothing Praxis Praxis: Intact  Cognition Overall Cognitive Status: Impaired/Different from baseline Memory Impairment: Decreased recall of new information;Decreased short term  memory Sensation Sensation Light Touch: Appears Intact Stereognosis: Appears Intact Hot/Cold: Appears Intact Proprioception: Appears Intact (intact in LUE) Coordination Gross Motor Movements are Fluid and Coordinated: Yes Fine Motor Movements are Fluid and Coordinated: Yes Heel Shin Test: decreased coordination RLe Motor  Motor Motor - Skilled Clinical Observations: Pt with flexed posture in standing. Mobility    supervision to min assist overall Trunk/Postural Assessment  Lumbar Strength Overall Lumbar Strength Comments: Pt with lumbar kyphosis in standing. Postural Control Postural Limitations: Pt with very kyphotic posture with forward head.  Pts daughter states that this is the way she has walked for many years.    Balance Static Sitting Balance Static Sitting - Level of Assistance: 7: Independent Static Standing Balance Static Standing - Level of Assistance: 5: Stand by assistance Extremity/Trunk Assessment RUE Assessment RUE Assessment: Within Functional Limits LUE AROM (degrees) LUE Overall AROM Comments: AROM shoulder flexion 0-130 degrees, shoulder strength 3+/5, AROM WFLS for all other joints including hand and elbow.  Strength 3+/5 throughout.    See FIM for current functional status  SAGUIER,JULIA 11/07/2012, 12:00 PM

## 2012-11-07 NOTE — Progress Notes (Signed)
Physical Therapy Session Note  Patient Details  Name: [name] MRN: [id number] Date of Birth: [birth year]  Today's Date: 11/07/2012 Time: 0905-1000 and 1335-1445 Time Calculation (min): 55 min and 40 min  Short Term Goals: Week 1:  PT Short Term Goal 1 (Week 1): Pt will perform transfers with RW at min assist PT Short Term Goal 2 (Week 1): Pt will propel w/c x 50' using BUEs or LEs as needed at supervision level PT Short Term Goal 3 (Week 1): Pt will perform dynamic standing balance at min assist PT Short Term Goal 4 (Week 1): Pt will ambulate x 50' with LRAD at mod assist  Skilled Therapeutic Interventions/Progress Updates:   Treatment 1: family ed with daughter Olegario Messier for sit>< stand from a variety of surfaces down to 22" height, gait on level terrain, simulated car transfer to 33" high seat using personal step stool , safety concerns with 4WW; discussion of home accessibility.    Pt has a small threshold (approx. 2") to enter house, but refused to attempt 4" curb in therapy department to simulate this.  Pt is limited by long-standing bil knee OA.   Pt ascended and descended ramp with therapist, using personal [phone] with close supervision, VCs.    Treatment 2: further family ed with Olegario Messier for gait with 4 WW using seat when pt fatigued, basic transfer, w/c parts management, bed mobility, discussion of bed rails for home use.  When pt is preparing to sit on seat of 4WW, she prefers to stand still, unlock brakes and move 4WW around behind her and lock before sitting, but this is challenging for balance.  Other method of locking 4WW and pt turning body is more difficult for her due to lack of UE support during turn.  Therapist advised pt and family not to use seat of 4WW for rest, for now, but keep w/c or a chair nearby.    Therapy Documentation Precautions:  Precautions Precautions: Fall Precaution Comments: L sided weakness and inattention; bil knee OA, R>L Restrictions Weight  Bearing Restrictions: No        See FIM for current functional status  Therapy/Group: Individual Therapy  [name] 11/07/2012, 2:31 PM

## 2012-11-07 NOTE — Progress Notes (Signed)
Patient ID: Aarionna Germer, female   DOB: [birth year], 79 y.o.   MRN: [id number] Subjective/Complaints: 79 y.o. female with history of DM, HTN, A Fib; who was admitted on 10/26/12 with left sided weakness and slurred speech. CT with question of early ischemic changes in right insula and patient treated with TPA. Patient with seizure episode with hypoxia later that evening requiring intubation. Started on Keppra. Follow up CT head revealed evolving right MCA infarcts 4.6 X 3.8 cm. 2 D echo with EF 55-65% and no wall abnormality, moderate to severe AVR and MVR. Carotid dopplers difficult to visualize due to movement. Neurology recommends plavix for CVA prophylaxis--but to discuss NOAC with Dr. Eldridge Dace due to patient's history of Afib   Wants to go home, no SOB  Review of Systems  HENT: Positive for hearing loss.   All other systems reviewed and are negative.   Objective: Vital Signs: Blood pressure 120/58, pulse 83, temperature 98.6 F (37 C), temperature source Oral, resp. rate 18, height 5\' 2"  (1.575 m), weight 84.6 kg (186 lb 8.2 oz), SpO2 96.00%. No results found. Results for orders placed during the hospital encounter of 10/31/12 (from the past 72 hour(s))  GLUCOSE, CAPILLARY     Status: Abnormal   Collection Time    11/04/12 11:09 AM      Result Value Range   Glucose-Capillary 167 (*) 70 - 99 mg/dL   Comment 1 Notify RN    GLUCOSE, CAPILLARY     Status: Abnormal   Collection Time    11/04/12  4:38 PM      Result Value Range   Glucose-Capillary 107 (*) 70 - 99 mg/dL   Comment 1 Notify RN    GLUCOSE, CAPILLARY     Status: Abnormal   Collection Time    11/04/12  8:27 PM      Result Value Range   Glucose-Capillary 106 (*) 70 - 99 mg/dL  GLUCOSE, CAPILLARY     Status: None   Collection Time    11/05/12  7:36 AM      Result Value Range   Glucose-Capillary 98  70 - 99 mg/dL   Comment 1 Notify RN    GLUCOSE, CAPILLARY     Status: Abnormal   Collection Time    11/05/12 11:35 AM       Result Value Range   Glucose-Capillary 161 (*) 70 - 99 mg/dL   Comment 1 Notify RN    GLUCOSE, CAPILLARY     Status: Abnormal   Collection Time    11/05/12  4:27 PM      Result Value Range   Glucose-Capillary 114 (*) 70 - 99 mg/dL   Comment 1 Notify RN    GLUCOSE, CAPILLARY     Status: Abnormal   Collection Time    11/05/12  8:18 PM      Result Value Range   Glucose-Capillary 146 (*) 70 - 99 mg/dL  GLUCOSE, CAPILLARY     Status: Abnormal   Collection Time    11/06/12  7:33 AM      Result Value Range   Glucose-Capillary 121 (*) 70 - 99 mg/dL   Comment 1 Notify RN    GLUCOSE, CAPILLARY     Status: Abnormal   Collection Time    11/06/12 12:16 PM      Result Value Range   Glucose-Capillary 116 (*) 70 - 99 mg/dL   Comment 1 Notify RN    GLUCOSE, CAPILLARY     Status: Abnormal  Collection Time    11/06/12  4:59 PM      Result Value Range   Glucose-Capillary 111 (*) 70 - 99 mg/dL   Comment 1 Notify RN    GLUCOSE, CAPILLARY     Status: Abnormal   Collection Time    11/06/12  8:44 PM      Result Value Range   Glucose-Capillary 117 (*) 70 - 99 mg/dL  GLUCOSE, CAPILLARY     Status: Abnormal   Collection Time    11/07/12  7:10 AM      Result Value Range   Glucose-Capillary 105 (*) 70 - 99 mg/dL   Comment 1 Notify RN        Nursing note and vitals reviewed.  Constitutional: She is oriented to person, place, and time. She appears well-developed and well-nourished.  HENT:  Head: Normocephalic and atraumatic.  Eyes: Conjunctivae are normal. Pupils are equal, round, and reactive to light.  Neck: Normal range of motion. Neck supple.  Cardiovascular: Normal rate. An irregularly irregular rhythm present.  Pulmonary/Chest: No respiratory distress. She has decreased breath sounds in the right lower field and the left lower field. She has no wheezes.  Abdominal: Soft. Bowel sounds are normal. She exhibits no distension. There is no tenderness.  Musculoskeletal: She exhibits no  edema. Left knee with a small effusion Discomfort and decreased ROM bilateral knees  Neurological: She is alert and oriented to person, place, and time.  Follows commands without difficulty. Speech clear. LUE with pronator drift. Left sided weakness, 3/5 Delt, bi, tri, grip 4/5 L HF KE, 3-/5 ADF/PF , effort inconsistent however. Has grossly intact sense to pain and light touch. 4/5 R side except 3- HF Cognitively fairly intact. Is very HOH  Skin: Skin is warm and dry. No erythema.  Diffuse ecchymosis left forearm and petechial area on BLE.  Psychiatric: She has a normal mood and affect. Her behavior is normal. Thought content normal.    Assessment/Plan: 1. Functional deficits secondary to R MCA infarct which require 3+ hours per day of interdisciplinary therapy in a comprehensive inpatient rehab setting. Physiatrist is providing close team supervision and 24 hour management of active medical problems listed below. Physiatrist and rehab team continue to assess barriers to discharge/monitor patient progress toward functional and medical goals.    FIM: FIM - Bathing Bathing Steps Patient Completed: Chest;Right Arm;Left Arm;Abdomen;Front perineal area;Buttocks;Right upper leg;Left upper leg;Right lower leg (including foot);Left lower leg (including foot) Bathing: 4: Steadying assist  FIM - Upper Body Dressing/Undressing Upper body dressing/undressing steps patient completed: Thread/unthread right bra strap;Thread/unthread left bra strap;Thread/unthread right sleeve of pullover shirt/dresss;Thread/unthread left sleeve of pullover shirt/dress;Put head through opening of pull over shirt/dress;Pull shirt over trunk Upper body dressing/undressing: 4: Min-Patient completed 75 plus % of tasks FIM - Lower Body Dressing/Undressing Lower body dressing/undressing steps patient completed: Thread/unthread right underwear leg;Thread/unthread left underwear leg;Pull underwear up/down;Thread/unthread right  pants leg;Thread/unthread left pants leg;Pull pants up/down;Don/Doff right shoe;Don/Doff left shoe Lower body dressing/undressing: 4: Steadying Assist  FIM - Toileting Toileting steps completed by patient: Adjust clothing prior to toileting;Performs perineal hygiene;Adjust clothing after toileting Toileting Assistive Devices: Grab bar or rail for support Toileting: 5: Supervision: Safety issues/verbal cues  FIM - Diplomatic Services operational officer Devices: Elevated toilet seat;Grab bars;Walker Toilet Transfers: 4-To toilet/BSC: Min A (steadying Pt. > 75%)  FIM - Bed/Chair Transfer Bed/Chair Transfer Assistive Devices: Manufacturing systems engineer Transfer: 4: Bed > Chair or W/C: Min A (steadying Pt. > 75%);4: Chair or W/C > Bed:  Min A (steadying Pt. > 75%)  FIM - Locomotion: Wheelchair Locomotion: Wheelchair: 0: Activity did not occur FIM - Locomotion: Ambulation Locomotion: Ambulation Assistive Devices: Other (comment) (Rollator) Ambulation/Gait Assistance: 4: Min guard Locomotion: Ambulation: 2: Travels 50 - 149 ft with minimal assistance (Pt.>75%)  Comprehension Comprehension Mode: Auditory Comprehension: 4-Understands basic 75 - 89% of the time/requires cueing 10 - 24% of the time  Expression Expression Mode: Verbal Expression: 5-Expresses basic needs/ideas: With extra time/assistive device  Social Interaction Social Interaction: 4-Interacts appropriately 75 - 89% of the time - Needs redirection for appropriate language or to initiate interaction.  Problem Solving Problem Solving: 4-Solves basic 75 - 89% of the time/requires cueing 10 - 24% of the time  Memory Memory: 4-Recognizes or recalls 75 - 89% of the time/requires cueing 10 - 24% of the time  Medical Problem List and Plan:  1. DVT Prophylaxis/Anticoagulation: Pharmaceutical: Other (comment)--Eliquis  2. Pain Management: continue voltaren gel to help with chronic knee, utilize ice as well 3. Mood: Provide ego support  regarding life changes. Patient would like to be able to go back to her own home. Will have LCSW follow for evaluation and support.  4. Neuropsych: This patient is capable of making decisions on her own behalf.  5. A trial Fibrillation: Monitor HR with bid checks. Continue amiodarone 400 mg bid with metoprolol 25 mg bid.  6. Hyponatremia/ Hypokalemia: Has been getting IV lasix on bid basis. Will add K+ supplement. Will follow up on electrolyte abnormalities in am.  8. DM type 2: Monitor BS with ac/hs cbg checks. Resumed metformin. Good control so far 9. Chronic mixed CHF: Recent admission for overload 08/15-08/20/14. 10. +UA--enterococcus empiric cipro, culture STILL pending, LOS (Days) 7 A FACE TO FACE EVALUATION WAS PERFORMED  Cervando Durnin E 11/07/2012, 8:00 AM

## 2012-11-08 MED ORDER — METFORMIN HCL ER 500 MG PO TB24: 500.0000 mg | ORAL_TABLET | Freq: Three times a day (TID) | ORAL | Status: AC

## 2012-11-08 MED ORDER — APIXABAN 2.5 MG PO TABS: 2.5000 mg | ORAL_TABLET | Freq: Two times a day (BID) | ORAL | Status: AC

## 2012-11-08 MED ORDER — POTASSIUM CHLORIDE CRYS ER 20 MEQ PO TBCR: 20.0000 meq | EXTENDED_RELEASE_TABLET | Freq: Two times a day (BID) | ORAL | Status: DC

## 2012-11-08 MED ORDER — FUROSEMIDE 40 MG PO TABS: 40.0000 mg | ORAL_TABLET | Freq: Every day | ORAL | Status: DC

## 2012-11-08 MED ORDER — LEVETIRACETAM 500 MG PO TABS: 500.0000 mg | ORAL_TABLET | Freq: Two times a day (BID) | ORAL | Status: DC

## 2012-11-08 MED ORDER — DICLOFENAC SODIUM 1 % TD GEL: 2.0000 g | Freq: Four times a day (QID) | TRANSDERMAL | Status: DC

## 2012-11-08 NOTE — Progress Notes (Signed)
Social Work Discharge Note Discharge Note  The overall goal for the admission was met for:   Discharge location: Yes-HOME WITH DAUGHTER STAYING WITH HER-24 HR  Length of Stay: Yes-8 DAYS  Discharge activity level: Yes-SUPERVISION/MIN LEVEL  Home/community participation: Yes  Services provided included: MD, RD, PT, OT, SLP, RN, Pharmacy and SW  Financial Services: Private Insurance: [employer]-MEDICARE COMPLETE  Follow-up services arranged: Home Health: [facility]-PT,OT,RN and Patient/Family has no preference for HH/DME agencies  Comments (or additional information):PT AND FAMILY REQUEST EARLIER DISCHARGE DUE TO FEEL PT'S MENTAL HEALTH IS SUFFERING HERE AND WANTS TO  BE AT HOME.  DAUGHTER TO BE THERE WITH HER AND PROVIDE SUPERVISION LEVEL  Patient/Family verbalized understanding of follow-up arrangements: Yes  Individual responsible for coordination of the follow-up plan: SELF & KATHY-DAUGHTER  Confirmed correct DME delivered: Lucy Chris 11/08/2012    Lucy Chris

## 2012-11-08 NOTE — Progress Notes (Signed)
Social Work Patient ID: [name], female   DOB: 02-22-1919, 79 y.o.   MRN: [id number] Have added home health Speech to order, pt and daughter agreed to this.  [facility] aware

## 2012-11-08 NOTE — Progress Notes (Signed)
Patient ID: [name], female   DOB: 1918/04/17, 79 y.o.   MRN: [id number] Subjective/Complaints: 79 y.o. female with history of DM, HTN, A Fib; who was admitted on 10/26/12 with left sided weakness and slurred speech. CT with question of early ischemic changes in right insula and patient treated with TPA. Patient with seizure episode with hypoxia later that evening requiring intubation. Started on Keppra. Follow up CT head revealed evolving right MCA infarcts 4.6 X 3.8 cm. 2 D echo with EF 55-65% and no wall abnormality, moderate to severe AVR and MVR. Carotid dopplers difficult to visualize due to movement. Neurology recommends plavix for CVA prophylaxis--but to discuss NOAC with Dr. Eldridge Dace due to patient's history of Afib   Ready to go home,  Left second toe pain with ambulation  Review of Systems  HENT: Positive for hearing loss.   All other systems reviewed and are negative.   Objective: Vital Signs: Blood pressure 113/73, pulse 71, temperature 98.3 F (36.8 C), temperature source Oral, resp. rate 19, height 5\' 2"  (1.575 m), weight 84.6 kg (186 lb 8.2 oz), SpO2 98.00%. Dg Chest 2 View  11/07/2012   CLINICAL DATA:  CHF, pleural effusions, diabetes, hypertension, hyperlipidemia  EXAM: CHEST  2 VIEW  COMPARISON:  11/01/2012  FINDINGS: Slight rotation to the left  Left subclavian transvenous pacemaker leads stable, projecting at right atrium and right ventricle.  Minimal enlargement of cardiac silhouette.  Atherosclerotic calcification aorta.  Mediastinal contours and pulmonary vascularity grossly normal for degree of rotation.  Bibasilar atelectasis and small effusions.  Remaining lungs clear.  No pneumothorax.  Bones demineralized.  IMPRESSION: Minimal bibasilar atelectasis and pleural effusions.  Minimal enlargement of cardiac silhouette post pacemaker.   Electronically Signed   By: Ulyses Southward M.D.   On: 11/07/2012 13:08   Results for orders placed during the hospital encounter of  10/31/12 (from the past 72 hour(s))  GLUCOSE, CAPILLARY     Status: None   Collection Time    11/05/12  7:36 AM      Result Value Range   Glucose-Capillary 98  70 - 99 mg/dL   Comment 1 Notify RN    GLUCOSE, CAPILLARY     Status: Abnormal   Collection Time    11/05/12 11:35 AM      Result Value Range   Glucose-Capillary 161 (*) 70 - 99 mg/dL   Comment 1 Notify RN    GLUCOSE, CAPILLARY     Status: Abnormal   Collection Time    11/05/12  4:27 PM      Result Value Range   Glucose-Capillary 114 (*) 70 - 99 mg/dL   Comment 1 Notify RN    GLUCOSE, CAPILLARY     Status: Abnormal   Collection Time    11/05/12  8:18 PM      Result Value Range   Glucose-Capillary 146 (*) 70 - 99 mg/dL  GLUCOSE, CAPILLARY     Status: Abnormal   Collection Time    11/06/12  7:33 AM      Result Value Range   Glucose-Capillary 121 (*) 70 - 99 mg/dL   Comment 1 Notify RN    GLUCOSE, CAPILLARY     Status: Abnormal   Collection Time    11/06/12 12:16 PM      Result Value Range   Glucose-Capillary 116 (*) 70 - 99 mg/dL   Comment 1 Notify RN    GLUCOSE, CAPILLARY     Status: Abnormal   Collection Time  11/06/12  4:59 PM      Result Value Range   Glucose-Capillary 111 (*) 70 - 99 mg/dL   Comment 1 Notify RN    GLUCOSE, CAPILLARY     Status: Abnormal   Collection Time    11/06/12  8:44 PM      Result Value Range   Glucose-Capillary 117 (*) 70 - 99 mg/dL  GLUCOSE, CAPILLARY     Status: Abnormal   Collection Time    11/07/12  7:10 AM      Result Value Range   Glucose-Capillary 105 (*) 70 - 99 mg/dL   Comment 1 Notify RN    GLUCOSE, CAPILLARY     Status: Abnormal   Collection Time    11/07/12 11:50 AM      Result Value Range   Glucose-Capillary 136 (*) 70 - 99 mg/dL   Comment 1 Notify RN    GLUCOSE, CAPILLARY     Status: Abnormal   Collection Time    11/07/12  4:48 PM      Result Value Range   Glucose-Capillary 115 (*) 70 - 99 mg/dL   Comment 1 Notify RN    GLUCOSE, CAPILLARY     Status:  Abnormal   Collection Time    11/07/12  9:00 PM      Result Value Range   Glucose-Capillary 124 (*) 70 - 99 mg/dL   Comment 1 Notify RN        Nursing note and vitals reviewed.  Constitutional: She is oriented to person, place, and time. She appears well-developed and well-nourished.  HENT:  Head: Normocephalic and atraumatic.  Eyes: Conjunctivae are normal. Pupils are equal, round, and reactive to light.  Neck: Normal range of motion. Neck supple.  Cardiovascular: Normal rate. An irregularly irregular rhythm present.  Pulmonary/Chest: No respiratory distress. She has decreased breath sounds in the right lower field and the left lower field. She has no wheezes.  Abdominal: Soft. Bowel sounds are normal. She exhibits no distension. There is no tenderness.  Musculoskeletal: She exhibits no edema. Left knee with a small effusion Discomfort and decreased ROM bilateral knees  Neurological: She is alert and oriented to person, place, and time.  Follows commands without difficulty. Speech clear. LUE with pronator drift. Left sided weakness, 3/5 Delt, bi, tri, grip 4/5 L HF KE, 3-/5 ADF/PF , effort inconsistent however. Has grossly intact sense to pain and light touch. 4/5 R side except 3- HF Cognitively fairly intact. Is very HOH  Skin: Skin is warm and dry. No erythema.  Diffuse ecchymosis left forearm and petechial area on BLE.  Psychiatric: She has a normal mood and affect. Her behavior is normal. Thought content normal.    Assessment/Plan: 1. Functional deficits secondary to R MCA infarct which require 3+ hours per day of interdisciplinary therapy in a comprehensive inpatient rehab setting. Physiatrist is providing close team supervision and 24 hour management of active medical problems listed below. Physiatrist and rehab team continue to assess barriers to discharge/monitor patient progress toward functional and medical goals.  Stable for D/C today F/u PCP in 1-2 weeks F/u PM&R 3  weeks See D/C summary See D/C instructions  FIM: FIM - Bathing Bathing Steps Patient Completed: Chest;Right Arm;Left Arm;Abdomen;Front perineal area;Buttocks;Right upper leg;Left upper leg;Right lower leg (including foot);Left lower leg (including foot) Bathing: 5: Supervision: Safety issues/verbal cues  FIM - Upper Body Dressing/Undressing Upper body dressing/undressing steps patient completed: Thread/unthread right bra strap;Thread/unthread left bra strap;Thread/unthread right sleeve of pullover shirt/dresss;Thread/unthread left  sleeve of pullover shirt/dress;Put head through opening of pull over shirt/dress;Pull shirt over trunk Upper body dressing/undressing: 5: Supervision: Safety issues/verbal cues FIM - Lower Body Dressing/Undressing Lower body dressing/undressing steps patient completed: Thread/unthread right underwear leg;Thread/unthread left underwear leg;Pull underwear up/down;Thread/unthread right pants leg;Thread/unthread left pants leg;Pull pants up/down;Don/Doff right shoe;Don/Doff left shoe Lower body dressing/undressing: 5: Supervision: Safety issues/verbal cues  FIM - Toileting Toileting steps completed by patient: Adjust clothing prior to toileting;Performs perineal hygiene Toileting Assistive Devices: Grab bar or rail for support Toileting: 5: Supervision: Safety issues/verbal cues  FIM - Diplomatic Services operational officer Devices: Elevated toilet seat;Grab bars;Walker Toilet Transfers: 4-To toilet/BSC: Min A (steadying Pt. > 75%)  FIM - Bed/Chair Transfer Bed/Chair Transfer Assistive Devices: HOB elevated Bed/Chair Transfer: 4: Sit > Supine: Min A (steadying pt. > 75%/lift 1 leg);5: Chair or W/C > Bed: Supervision (verbal cues/safety issues);5: Bed > Chair or W/C: Supervision (verbal cues/safety issues)  FIM - Locomotion: Wheelchair Distance: 150 Locomotion: Wheelchair: 5: Travels 150 ft or more: maneuvers on rugs and over door sills with supervision,  cueing or coaxing FIM - Locomotion: Ambulation Locomotion: Ambulation Assistive Devices: Designer, industrial/product [phone] with brakes) Ambulation/Gait Assistance: 5: Supervision Locomotion: Ambulation: 5: Travels 150 ft or more with supervision/safety issues  Comprehension Comprehension Mode: Auditory Comprehension: 5-Follows basic conversation/direction: With extra time/assistive device  Expression Expression Mode: Verbal Expression: 5-Expresses basic 90% of the time/requires cueing < 10% of the time.  Social Interaction Social Interaction: 4-Interacts appropriately 75 - 89% of the time - Needs redirection for appropriate language or to initiate interaction.  Problem Solving Problem Solving: 5-Solves basic 90% of the time/requires cueing < 10% of the time  Memory Memory: 5-Recognizes or recalls 90% of the time/requires cueing < 10% of the time  Medical Problem List and Plan:  1. DVT Prophylaxis/Anticoagulation: Pharmaceutical: Other (comment)--Eliquis  2. Pain Management: continue voltaren gel to help with chronic knee, utilize ice as well 3. Mood:stable no depression symptoms 4. Neuropsych: This patient is capable of making decisions on her own behalf.  5. A trial Fibrillation: Monitor HR with bid checks. Continue amiodarone 400 mg bid with metoprolol 25 mg bid.  6. Hyponatremia/ Hypokalemia: Has been getting IV lasix on bid basis. Will add K+ supplement. Will follow up on electrolyte abnormalities in am.  8. DM type 2: Monitor BS with ac/hs cbg checks. Resumed metformin. Good control so far 9. Chronic mixed CHF: Recent admission for overload 08/15-08/20/14LOS (Days) 8 A FACE TO FACE EVALUATION WAS PERFORMED  Delon Revelo E 11/08/2012, 7:31 AM

## 2012-11-08 NOTE — Progress Notes (Signed)
Patient discharge to home with daughter at 12.  Discharge instruction provided by Delle Reining, PA.  Patient/daughter verbalize understanding, no further question ask.  Patient escorted off unit by Rehab NT.

## 2012-11-10 DIAGNOSIS — I69993 Ataxia following unspecified cerebrovascular disease: Secondary | ICD-10-CM

## 2012-11-10 DIAGNOSIS — I6330 Cerebral infarction due to thrombosis of unspecified cerebral artery: Secondary | ICD-10-CM

## 2012-11-11 ENCOUNTER — Telehealth: Payer: Self-pay

## 2012-11-11 DIAGNOSIS — I6340 Cerebral infarction due to embolism of unspecified cerebral artery: Secondary | ICD-10-CM

## 2012-11-11 NOTE — Telephone Encounter (Signed)
Ok to order 

## 2012-11-11 NOTE — Telephone Encounter (Signed)
Patient's daughter called and stated that the patient was released from Rehab on 10/29/12. She is now having problems with standing and sitting. They are requesting a order for a lift chair.

## 2012-11-12 NOTE — Telephone Encounter (Signed)
Order placed for lift chair.

## 2012-11-20 ENCOUNTER — Other Ambulatory Visit: Payer: Self-pay | Admitting: Interventional Cardiology

## 2012-11-20 ENCOUNTER — Telehealth: Payer: Self-pay

## 2012-11-20 DIAGNOSIS — Z79899 Other long term (current) drug therapy: Secondary | ICD-10-CM

## 2012-11-20 DIAGNOSIS — I4891 Unspecified atrial fibrillation: Secondary | ICD-10-CM

## 2012-11-20 NOTE — Telephone Encounter (Signed)
Olegario Messier( Patient's Daughter) called and stated that that we had faxed the order for a lift chair to [facility] which they received but [employer] needs [place] to contact them so they will pay for it. She did not leave a contact number for [employer]. Kathy's contact number is [phone].

## 2012-12-03 ENCOUNTER — Encounter: Payer: Medicare Other | Attending: Physical Medicine & Rehabilitation

## 2012-12-03 ENCOUNTER — Ambulatory Visit (HOSPITAL_BASED_OUTPATIENT_CLINIC_OR_DEPARTMENT_OTHER): Payer: Medicare Other | Admitting: Physical Medicine & Rehabilitation

## 2012-12-03 ENCOUNTER — Encounter: Payer: Self-pay | Admitting: Physical Medicine & Rehabilitation

## 2012-12-03 VITALS — BP 122/66 | HR 61 | Resp 14 | Ht 62.0 in | Wt 189.4 lb

## 2012-12-03 DIAGNOSIS — M7989 Other specified soft tissue disorders: Secondary | ICD-10-CM | POA: Insufficient documentation

## 2012-12-03 DIAGNOSIS — I6340 Cerebral infarction due to embolism of unspecified cerebral artery: Secondary | ICD-10-CM

## 2012-12-03 DIAGNOSIS — I69919 Unspecified symptoms and signs involving cognitive functions following unspecified cerebrovascular disease: Secondary | ICD-10-CM | POA: Insufficient documentation

## 2012-12-03 DIAGNOSIS — R5381 Other malaise: Secondary | ICD-10-CM | POA: Insufficient documentation

## 2012-12-03 NOTE — Progress Notes (Signed)
Subjective:    Patient ID: [name], female    DOB: 07-02-1918, 79 y.o.   MRN: [id number] Rever Pichette is a 79 y.o. female with history of DM, HTN, A Fib; who was admitted on 10/26/12 with left sided weakness and slurred speech. CT with question of early ischemic changes in right insula and patient treated with TPA. Patient with seizure episode with hypoxia later that evening requiring intubation. Started on Keppra. Follow up CT head revealed evolving right MCA infarcts 4.6 X 3.8 cm. 2 D echo with EF 55-65% and no wall abnormality, moderate to severe AVR and MVR. She was started on eliquis for secondary stroke prevention as well as diuretics to help with acute on chronic CHF  HPI Admit date: 10/31/2012  Discharge date: 11/11/2012  Discharge Diagnoses:  Principal Problem:  Cerebral embolism with cerebral infarction   Poor endurance couldn't tolerate 3 days per week PT OT and speech therapy Took one week of therapy off, would like to resume  Daughter recognizes mild cognitive problems since the stroke. The patient can still tell her what pills need to be paid and also how much needs to be sent. The patient no longer writes her own checks however. Pain Inventory Average Pain 0 Pain Right Now 0 My pain is no pain  In the last 24 hours, has pain interfered with the following? General activity 0 Relation with others 0 Enjoyment of life 0 What TIME of day is your pain at its worst? no pain Sleep (in general) Good  Pain is worse with: no pain Pain improves with: pain Relief from Meds: na  Mobility walk with assistance use a walker ability to climb steps?  no do you drive?  no use a wheelchair needs help with transfers Do you have any goals in this area?  no  Function not employed: date last employed na I need assistance with the following:  meal prep, household duties and shopping  Neuro/Psych bowel control problems weakness depression  Prior Studies Any changes  since last visit?  no  Physicians involved in your care Any changes since last visit?  no   History reviewed. No pertinent family history. History   Social History  . Marital Status: Married    Spouse Name: N/A    Number of Children: N/A  . Years of Education: N/A   Social History Main Topics  . Smoking status: Never Smoker   . Smokeless tobacco: Never Used  . Alcohol Use: No  . Drug Use: No  . Sexual Activity: None   Other Topics Concern  . None   Social History Narrative  . None   Past Surgical History  Procedure Laterality Date  . Pacemaker insertion  2008  . Cholecystectomy    . Thyroid surgery     Past Medical History  Diagnosis Date  . Diabetes mellitus   . Hypertension   . Hyperlipidemia   . Arthritis   . AF (atrial fibrillation)   . Cataract     decreased vision in left eye  . Peripheral vascular disease   . CHF (congestive heart failure)    BP 122/66  Pulse 61  Resp 14  Ht 5\' 2"  (1.575 m)  Wt 189 lb 6.4 oz (85.911 kg)  BMI 34.63 kg/m2  SpO2 98%     Review of Systems  Constitutional: Positive for appetite change and unexpected weight change.  Respiratory: Positive for cough and shortness of breath.   Cardiovascular: Positive for leg swelling.  Genitourinary:  Bowel control problems  Neurological: Positive for weakness.  Psychiatric/Behavioral: Positive for dysphoric mood.  All other systems reviewed and are negative.       Objective:   Physical Exam  Motor strength is 5/5 bilateral deltoid, bicep, tricep, grip, hip flexor, knee extensors, ankle dorsiflexors and plantar flexor Visual fields show some delay of recognizing left field movement on confrontation testing. Double simultaneous stimulation intact  2+ edema bilateral pedal and pretibial. Note skin breakdown. Bluish discoloration to feed which improves with elevation. No foot pain or tenderness.  Orientation to person place and time      Assessment & Plan:  Cerebral  embolism with cerebral infarction -recommend resume PT OT twice a week Discussed that cognitive deficits are fairly mild, do not think further speech is absolutely necessary Return to clinic when necessary Active Problems:  Acute combined systolic and diastolic congestive heart failure followup cardiology A-fib followup cardiology Diabetes followup primary care physician

## 2012-12-03 NOTE — Patient Instructions (Addendum)
If pt develops more weakness or speech problems call 911 for possible new stroke

## 2012-12-04 ENCOUNTER — Telehealth: Payer: Self-pay | Admitting: Interventional Cardiology

## 2012-12-04 NOTE — Telephone Encounter (Signed)
Ok to take extra lasix 40 mg daily for three days

## 2012-12-04 NOTE — Telephone Encounter (Signed)
New problem   Pt's daughter [name] would like a call back. Concerning pt's toes are turning Blue and weight gain.  Daughter has questions about Meds.

## 2012-12-04 NOTE — Telephone Encounter (Signed)
Pts daughter states pts toes look okay now but early this morning and yesterday her toes were blue. On October 1st pt weighed 184lbs and today pt weighs 188.2 lbs. Pts feet are very swollen. Pt took an extra lasix about 1 week ago. Pt normally only takes 40mg  in the but pts daughter is going to give her an extra 40mg  at noon. Pt wants to know if she can start taking two 40mg  tablets of lasix in the am to help with swelling and if so, how long.

## 2012-12-05 NOTE — Telephone Encounter (Signed)
Pt daughter notified yesterday afternoon at pts request.

## 2012-12-06 ENCOUNTER — Telehealth: Payer: Self-pay | Admitting: *Deleted

## 2012-12-06 NOTE — Telephone Encounter (Signed)
Notyfying Dr Wynn Banker that there has been a change in frequency with HHPT due to doctor appt, but also requesting extension 2x wk for 2wk.  Approval given.

## 2012-12-17 ENCOUNTER — Telehealth: Payer: Self-pay | Admitting: Interventional Cardiology

## 2012-12-17 MED ORDER — AMIODARONE HCL 200 MG PO TABS: 200.0000 mg | ORAL_TABLET | Freq: Every day | ORAL | Status: DC

## 2012-12-17 NOTE — Telephone Encounter (Signed)
New message    Need presc for amiodarone 200mg [facility]

## 2012-12-17 NOTE — Telephone Encounter (Signed)
Refilled amiodarone 

## 2012-12-25 ENCOUNTER — Telehealth: Payer: Self-pay | Admitting: Interventional Cardiology

## 2012-12-25 NOTE — Telephone Encounter (Signed)
OK to increase amiodarone to 400 mg daily.

## 2012-12-25 NOTE — Telephone Encounter (Signed)
Returned call to patient's daughter Olegario Messier she stated she spoke to someone earlier and just wanted Dr.Varansi to know she thinks her mother is back in atrial fib.Stated she has same symptoms.Stated she is sob,weak,shaky.Stated she wanted to know if amiodarone needs to be increased and wanted to know if patient can be seen sooner.Stated appointment was moved up to 01/01/13.Message sent to Dr.Varanasi for advice.

## 2012-12-25 NOTE — Telephone Encounter (Signed)
Spoke with daughter who is concerned because pt has been becoming extremely SOB with any movement/activity.  She is also very weak and shaky.  Of note pt's son recently died and of course she is having some depression related to this.  Per the daughter - pt's wt is the same and she doesn't see much if any change in edema at ankles.  Her concern is that she is back in At Fib.  Pt is on Amiodarone, Lopressor and Xarelto.  Her appt was moved up to 11/5 but daughter aware I will forward this information to MD for review and any other recommendations.  She is in agreement and states she thinks she is fine to wait until 11/5 to be seen.

## 2012-12-25 NOTE — Telephone Encounter (Signed)
New Problem     Pt's daughter called she has questions about mom.   Pt is having SOB and has some other problems. They will keep 11/10 appt.

## 2012-12-25 NOTE — Telephone Encounter (Signed)
Follow up    Daughter is calling back with some more info:     Remote ck not equipment for this.  Please ck pacer at next visit.

## 2012-12-26 MED ORDER — AMIODARONE HCL 200 MG PO TABS: 400.0000 mg | ORAL_TABLET | Freq: Every day | ORAL | Status: AC

## 2012-12-26 NOTE — Telephone Encounter (Signed)
Pts daughter notified and she will increase Amiodarone to 400mg  daily until 11/5 appt.

## 2012-12-27 ENCOUNTER — Emergency Department (HOSPITAL_COMMUNITY): Payer: Medicare Other

## 2012-12-27 ENCOUNTER — Encounter (HOSPITAL_COMMUNITY): Payer: Self-pay | Admitting: Emergency Medicine

## 2012-12-27 ENCOUNTER — Inpatient Hospital Stay (HOSPITAL_COMMUNITY)
Admission: EM | Admit: 2012-12-27 | Discharge: 2012-12-31 | DRG: 291 | Disposition: A | Discharge status: Home / Self Care | Payer: Medicare Other | Attending: Internal Medicine | Admitting: Internal Medicine

## 2012-12-27 DIAGNOSIS — Z515 Encounter for palliative care: Secondary | ICD-10-CM

## 2012-12-27 DIAGNOSIS — M7989 Other specified soft tissue disorders: Secondary | ICD-10-CM

## 2012-12-27 DIAGNOSIS — E871 Hypo-osmolality and hyponatremia: Secondary | ICD-10-CM

## 2012-12-27 DIAGNOSIS — Z8673 Personal history of transient ischemic attack (TIA), and cerebral infarction without residual deficits: Secondary | ICD-10-CM

## 2012-12-27 DIAGNOSIS — Z950 Presence of cardiac pacemaker: Secondary | ICD-10-CM

## 2012-12-27 DIAGNOSIS — R569 Unspecified convulsions: Secondary | ICD-10-CM

## 2012-12-27 DIAGNOSIS — J9600 Acute respiratory failure, unspecified whether with hypoxia or hypercapnia: Secondary | ICD-10-CM | POA: Diagnosis present

## 2012-12-27 DIAGNOSIS — F411 Generalized anxiety disorder: Secondary | ICD-10-CM

## 2012-12-27 DIAGNOSIS — I6340 Cerebral infarction due to embolism of unspecified cerebral artery: Secondary | ICD-10-CM

## 2012-12-27 DIAGNOSIS — S06890S Other specified intracranial injury without loss of consciousness, sequela: Secondary | ICD-10-CM

## 2012-12-27 DIAGNOSIS — Z66 Do not resuscitate: Secondary | ICD-10-CM | POA: Diagnosis present

## 2012-12-27 DIAGNOSIS — E119 Type 2 diabetes mellitus without complications: Secondary | ICD-10-CM | POA: Diagnosis present

## 2012-12-27 DIAGNOSIS — I5030 Unspecified diastolic (congestive) heart failure: Secondary | ICD-10-CM

## 2012-12-27 DIAGNOSIS — H547 Unspecified visual loss: Secondary | ICD-10-CM | POA: Diagnosis present

## 2012-12-27 DIAGNOSIS — I70269 Atherosclerosis of native arteries of extremities with gangrene, unspecified extremity: Secondary | ICD-10-CM

## 2012-12-27 DIAGNOSIS — J189 Pneumonia, unspecified organism: Secondary | ICD-10-CM

## 2012-12-27 DIAGNOSIS — I5041 Acute combined systolic (congestive) and diastolic (congestive) heart failure: Principal | ICD-10-CM | POA: Diagnosis present

## 2012-12-27 DIAGNOSIS — R0600 Dyspnea, unspecified: Secondary | ICD-10-CM

## 2012-12-27 DIAGNOSIS — Z79899 Other long term (current) drug therapy: Secondary | ICD-10-CM

## 2012-12-27 DIAGNOSIS — E785 Hyperlipidemia, unspecified: Secondary | ICD-10-CM | POA: Diagnosis present

## 2012-12-27 DIAGNOSIS — I4891 Unspecified atrial fibrillation: Secondary | ICD-10-CM | POA: Diagnosis present

## 2012-12-27 DIAGNOSIS — I739 Peripheral vascular disease, unspecified: Secondary | ICD-10-CM | POA: Diagnosis present

## 2012-12-27 DIAGNOSIS — I10 Essential (primary) hypertension: Secondary | ICD-10-CM | POA: Diagnosis present

## 2012-12-27 DIAGNOSIS — M129 Arthropathy, unspecified: Secondary | ICD-10-CM | POA: Diagnosis present

## 2012-12-27 DIAGNOSIS — B952 Enterococcus as the cause of diseases classified elsewhere: Secondary | ICD-10-CM

## 2012-12-27 DIAGNOSIS — I509 Heart failure, unspecified: Secondary | ICD-10-CM | POA: Diagnosis present

## 2012-12-27 DIAGNOSIS — R0602 Shortness of breath: Secondary | ICD-10-CM

## 2012-12-27 HISTORY — DX: Cerebral infarction, unspecified: I63.9

## 2012-12-27 LAB — COMPREHENSIVE METABOLIC PANEL
BUN: 15 mg/dL (ref 6–23)
Calcium: 7.6 mg/dL — ABNORMAL LOW (ref 8.4–10.5)
Chloride: 97 mEq/L (ref 96–112)
Creatinine, Ser: 1.15 mg/dL — ABNORMAL HIGH (ref 0.50–1.10)
GFR calc Af Amer: 47 mL/min — ABNORMAL LOW (ref >90)
GFR calc non Af Amer: 40 mL/min — ABNORMAL LOW (ref >90)
Potassium: 3.5 mEq/L (ref 3.5–5.1)
Sodium: 138 mEq/L (ref 135–145)

## 2012-12-27 LAB — POCT I-STAT TROPONIN I: Troponin i, poc: 0.02 ng/mL (ref 0.00–0.08)

## 2012-12-27 LAB — CBC WITH DIFFERENTIAL/PLATELET
Basophils Relative: 0 % (ref 0–1)
Eosinophils Absolute: 0.1 10*3/uL (ref 0.0–0.7)
Hemoglobin: 13.2 g/dL (ref 12.0–15.0)
MCH: 31.7 pg (ref 26.0–34.0)
MCHC: 34.6 g/dL (ref 30.0–36.0)
Monocytes Relative: 11 % (ref 3–12)
Neutro Abs: 5.6 10*3/uL (ref 1.7–7.7)
Neutrophils Relative %: 70 % (ref 43–77)
Platelets: 225 10*3/uL (ref 150–400)
RBC: 4.17 MIL/uL (ref 3.87–5.11)

## 2012-12-27 LAB — URINALYSIS, ROUTINE W REFLEX MICROSCOPIC
Nitrite: NEGATIVE
Specific Gravity, Urine: 1.02 (ref 1.005–1.030)
Urobilinogen, UA: 1 mg/dL (ref 0.0–1.0)

## 2012-12-27 LAB — GLUCOSE, CAPILLARY: Glucose-Capillary: 95 mg/dL (ref 70–99)

## 2012-12-27 LAB — PRO B NATRIURETIC PEPTIDE: Pro B Natriuretic peptide (BNP): 3125 pg/mL — ABNORMAL HIGH (ref 0–450)

## 2012-12-27 MED ORDER — APIXABAN 2.5 MG PO TABS
2.5000 mg | ORAL_TABLET | Freq: Two times a day (BID) | ORAL | Status: DC
  Administered 2012-12-27 – 2012-12-31 (×8): 2.5 mg via ORAL
  Filled 2012-12-27 (×9): qty 1

## 2012-12-27 MED ORDER — ONDANSETRON HCL 4 MG/2ML IJ SOLN: 4.0000 mg | Freq: Four times a day (QID) | INTRAMUSCULAR | Status: DC | PRN

## 2012-12-27 MED ORDER — INSULIN ASPART 100 UNIT/ML ~~LOC~~ SOLN
0.0000 [IU] | Freq: Every day | SUBCUTANEOUS | Status: DC
Start: 2012-12-27 — End: 2012-12-31

## 2012-12-27 MED ORDER — INSULIN ASPART 100 UNIT/ML ~~LOC~~ SOLN
0.0000 [IU] | Freq: Three times a day (TID) | SUBCUTANEOUS | Status: DC
  Administered 2012-12-28: 11:00:00 3 [IU] via SUBCUTANEOUS
  Administered 2012-12-28 – 2012-12-29 (×4): 2 [IU] via SUBCUTANEOUS
  Administered 2012-12-30: 12:00:00 3 [IU] via SUBCUTANEOUS
  Administered 2012-12-30: 5 [IU] via SUBCUTANEOUS
  Administered 2012-12-30 – 2012-12-31 (×3): 2 [IU] via SUBCUTANEOUS

## 2012-12-27 MED ORDER — AMIODARONE HCL 200 MG PO TABS
400.0000 mg | ORAL_TABLET | Freq: Every day | ORAL | Status: DC
  Administered 2012-12-28 – 2012-12-31 (×4): 400 mg via ORAL
  Filled 2012-12-27 (×4): qty 2

## 2012-12-27 MED ORDER — SODIUM CHLORIDE 0.9 % IV SOLN: 250.0000 mL | INTRAVENOUS | Status: DC | PRN

## 2012-12-27 MED ORDER — SERTRALINE HCL 25 MG PO TABS
25.0000 mg | ORAL_TABLET | Freq: Every day | ORAL | Status: DC
  Administered 2012-12-27 – 2012-12-31 (×5): 25 mg via ORAL
  Filled 2012-12-27 (×5): qty 1

## 2012-12-27 MED ORDER — METOPROLOL TARTRATE 25 MG PO TABS
25.0000 mg | ORAL_TABLET | Freq: Two times a day (BID) | ORAL | Status: DC
  Administered 2012-12-27 – 2012-12-31 (×7): 25 mg via ORAL
  Filled 2012-12-27 (×9): qty 1

## 2012-12-27 MED ORDER — SODIUM CHLORIDE 0.9 % IJ SOLN
3.0000 mL | Freq: Two times a day (BID) | INTRAMUSCULAR | Status: DC
  Administered 2012-12-27 – 2012-12-31 (×9): 3 mL via INTRAVENOUS

## 2012-12-27 MED ORDER — VANCOMYCIN HCL 10 G IV SOLR
1250.0000 mg | INTRAVENOUS | Status: DC
  Filled 2012-12-27: qty 1250

## 2012-12-27 MED ORDER — FUROSEMIDE 10 MG/ML IJ SOLN
40.0000 mg | Freq: Every day | INTRAMUSCULAR | Status: DC
  Administered 2012-12-27: 17:00:00 40 mg via INTRAVENOUS
  Filled 2012-12-27: qty 4

## 2012-12-27 MED ORDER — FUROSEMIDE 10 MG/ML IJ SOLN
40.0000 mg | Freq: Once | INTRAMUSCULAR | Status: DC
  Filled 2012-12-27: qty 4

## 2012-12-27 MED ORDER — DEXTROSE 5 % IV SOLN
1.0000 g | Freq: Once | INTRAVENOUS | Status: DC
  Filled 2012-12-27: qty 1

## 2012-12-27 MED ORDER — LEVETIRACETAM 500 MG PO TABS
500.0000 mg | ORAL_TABLET | Freq: Two times a day (BID) | ORAL | Status: DC
  Administered 2012-12-27 – 2012-12-31 (×8): 500 mg via ORAL
  Filled 2012-12-27 (×9): qty 1

## 2012-12-27 MED ORDER — ATORVASTATIN CALCIUM 20 MG PO TABS
20.0000 mg | ORAL_TABLET | Freq: Every day | ORAL | Status: DC
  Administered 2012-12-27 – 2012-12-30 (×4): 20 mg via ORAL
  Filled 2012-12-27 (×5): qty 1

## 2012-12-27 MED ORDER — SODIUM CHLORIDE 0.9 % IJ SOLN: 3.0000 mL | INTRAMUSCULAR | Status: DC | PRN

## 2012-12-27 MED ORDER — VANCOMYCIN HCL 10 G IV SOLR
1250.0000 mg | Freq: Once | INTRAVENOUS | Status: AC
  Administered 2012-12-27: 17:00:00 1250 mg via INTRAVENOUS
  Filled 2012-12-27: qty 1250

## 2012-12-27 MED ORDER — ACETAMINOPHEN 325 MG PO TABS: 650.0000 mg | ORAL_TABLET | ORAL | Status: DC | PRN

## 2012-12-27 MED ORDER — POTASSIUM CHLORIDE CRYS ER 20 MEQ PO TBCR
20.0000 meq | EXTENDED_RELEASE_TABLET | Freq: Two times a day (BID) | ORAL | Status: DC
  Administered 2012-12-27: 22:00:00 20 meq via ORAL
  Filled 2012-12-27 (×3): qty 1

## 2012-12-27 NOTE — ED Provider Notes (Signed)
CSN: [id number]     Arrival date & time 12/27/12  1038 History   First MD Initiated Contact with Patient 12/27/12 1039     Chief Complaint  Patient presents with  . Shortness of Breath  . Weakness   (Consider location/radiation/quality/duration/timing/severity/associated sxs/prior Treatment) HPI Comments: Patient with a history of Atrial Fibrillation, CHF, and Stroke presents with a chief complaint of weakness and shortness of breath for the past week.  Symptoms gradually worsening. She also reports an associated productive cough.  She currently lives at home with her daughter.  Daughter reports that earlier today the patient had an episode around 99 AM where she developed a tremor of both arms and both legs.  She also became cold to the touch and was responding minimally to questions.  This episode concerned family and prompted them to call 911.  Family reports that the episode lasted for approximately 30 minutes.  Family report that the patient looks better at this time.  Patient denies any chest pain.  She denies fever, chills, or any other pain.  Patient was last hospitalized in September for a CVA.    The history is provided by the patient.    Past Medical History  Diagnosis Date  . Diabetes mellitus   . Hypertension   . Hyperlipidemia   . Arthritis   . AF (atrial fibrillation)   . Cataract     decreased vision in left eye  . Peripheral vascular disease   . CHF (congestive heart failure)   . Stroke    Past Surgical History  Procedure Laterality Date  . Pacemaker insertion  2008  . Cholecystectomy    . Thyroid surgery     History reviewed. No pertinent family history. History  Substance Use Topics  . Smoking status: Never Smoker   . Smokeless tobacco: Never Used  . Alcohol Use: No   OB History   Grav Para Term Preterm Abortions TAB SAB Ect Mult Living                 Review of Systems  Respiratory: Positive for cough and shortness of breath.   Neurological: Positive  for weakness.  All other systems reviewed and are negative.    Allergies  Nitrofurantoin; Protonix; Sulfa antibiotics; and Trimethoprim  Home Medications   Current Outpatient Rx  Name  Route  Sig  Dispense  Refill  . amiodarone (PACERONE) 200 MG tablet   Oral   Take 2 tablets (400 mg total) by mouth daily.   60 tablet   5   . apixaban (ELIQUIS) 2.5 MG TABS tablet   Oral   Take 1 tablet (2.5 mg total) by mouth 2 (two) times daily.   60 tablet   1   . atorvastatin (LIPITOR) 20 MG tablet   Oral   Take 20 mg by mouth daily.         Marland Kitchen CALCIUM PO   Oral   Take 600 mg by mouth 3 (three) times daily.         . furosemide (LASIX) 40 MG tablet   Oral   Take 1 tablet (40 mg total) by mouth daily.   30 tablet   1   . levETIRAcetam (KEPPRA) 500 MG tablet   Oral   Take 1 tablet (500 mg total) by mouth 2 (two) times daily.   60 tablet   1   . metFORMIN (GLUCOPHAGE-XR) 500 MG 24 hr tablet   Oral   Take 1 tablet (500 mg  total) by mouth 3 (three) times daily after meals.         . metoprolol tartrate (LOPRESSOR) 25 MG tablet   Oral   Take 25 mg by mouth 2 (two) times daily.         . potassium chloride SA (K-DUR,KLOR-CON) 20 MEQ tablet   Oral   Take 1 tablet (20 mEq total) by mouth 2 (two) times daily.   60 tablet   1   . sertraline (ZOLOFT) 25 MG tablet   Oral   Take 25 mg by mouth daily.          BP 152/53  Pulse 65  Temp(Src) 97.4 F (36.3 C) (Oral)  Resp 19  SpO2 100% Physical Exam  Nursing note and vitals reviewed. Constitutional: She appears well-developed and well-nourished.  HENT:  Head: Normocephalic and atraumatic.  Mouth/Throat: Oropharynx is clear and moist.  Neck: Normal range of motion. Neck supple.  Cardiovascular: Normal rate, regular rhythm and normal heart sounds.   Pulmonary/Chest: Effort normal. She has no wheezes.  Decreased breath sounds in the RLL  Abdominal: Soft. Bowel sounds are normal. She exhibits no distension and no  mass. There is no tenderness. There is no rebound and no guarding.  Neurological: She is alert. She has normal strength. No cranial nerve deficit or sensory deficit.  Skin: Skin is warm and dry.  Psychiatric: She has a normal mood and affect.    ED Course  Procedures (including critical care time) Labs Review Labs Reviewed  CBC WITH DIFFERENTIAL - Abnormal; Notable for the following:    RDW 15.6 (*)    All other components within normal limits  COMPREHENSIVE METABOLIC PANEL - Abnormal; Notable for the following:    Glucose, Bld 122 (*)    Creatinine, Ser 1.15 (*)    Calcium 7.6 (*)    Total Protein 5.2 (*)    Albumin 2.7 (*)    GFR calc non Af Amer 40 (*)    GFR calc Af Amer 47 (*)    All other components within normal limits  URINALYSIS, ROUTINE W REFLEX MICROSCOPIC - Abnormal; Notable for the following:    Color, Urine AMBER (*)    Bilirubin Urine SMALL (*)    Ketones, ur 15 (*)    All other components within normal limits  PRO B NATRIURETIC PEPTIDE - Abnormal; Notable for the following:    Pro B Natriuretic peptide (BNP) 3125.0 (*)    All other components within normal limits  POCT I-STAT TROPONIN I   Imaging Review Dg Chest 2 View  12/27/2012   CLINICAL DATA:  Shortness of breath.  EXAM: CHEST  2 VIEW  COMPARISON:  Two-view chest 11/07/2012  FINDINGS: The heart is enlarged. A left pleural effusion and basilar airspace disease has increased since the prior study. Mild pulmonary vascular congestion is evident. A small right pleural effusion is noted as well. Pacing wires are stable. Atherosclerotic calcifications are noted in the aorta. The upper lung fields are clear. Emphysematous changes are noted. Degenerative changes throughout the thoracic spine are stable.  IMPRESSION: 1. Increased left pleural effusion and basilar airspace disease. Pneumonia is not excluded. 2. Small right pleural effusion and associated atelectasis. 3. Emphysema. 4. Cardiomegaly and mild pulmonary  vascular congestion without frank edema otherwise.   Electronically Signed   By: Gennette Pac M.D.   On: 12/27/2012 11:59    EKG Interpretation   None       Date: 12/27/2012  Rate: 65  Rhythm:  junctional rhythm  QRS Axis: left  Intervals: normal  ST/T Wave abnormalities: nonspecific T wave changes  Conduction Disutrbances:right bundle branch block  Narrative Interpretation:   Old EKG Reviewed: changes noted  Patient discussed with Dr. Benjamine Mola who has agreed to admit the patient.    MDM  No diagnosis found. Patient presenting with a chief complaint of SOB, cough, and generalized weakness.  Work up here reveals an elevated BNP and a right pleural effusion.  CXR also showing possible Pneumonia.  Labs otherwise unremarkable.  Patient given Lasix IV in the ED and started on antibiotics to treat for HAP.  Patient admitted to [facility] for additional management.     Santiago Glad, PA-C 12/27/12 1600

## 2012-12-27 NOTE — ED Provider Notes (Signed)
Medical screening examination/treatment/procedure(s) were conducted as a shared visit with non-physician practitioner(s) and myself.  I personally evaluated the patient during the encounter.  EKG Interpretation   None      Patient seen and examined. Labs and x-rays reviewed and findings consistent with early CHF versus pneumonia. Will omit to hospitalist  Toy Baker, MD 12/27/12 1341

## 2012-12-27 NOTE — Progress Notes (Signed)
Report given to receiving RN. Patient is stable with no verbal complaints and no signs or symptoms of distress or discomfort.  

## 2012-12-27 NOTE — Progress Notes (Signed)
ANTIBIOTIC CONSULT NOTE - INITIAL  Pharmacy Consult for Vancomycin-Rx + Cefepime-MD Indication: rule out pneumonia  Allergies  Allergen Reactions  . Nitrofurantoin Nausea And Vomiting  . Protonix [Pantoprazole Sodium] Nausea Only  . Sulfa Antibiotics Hives  . Trimethoprim Nausea Only    Patient Measurements: Height: 5' 1.81" (157 cm) Weight: 189 lb 6 oz (85.9 kg) IBW/kg (Calculated) : 49.67  Vital Signs: Temp: 97.4 F (36.3 C) (10/31 1050) Temp src: Oral (10/31 1050) BP: 152/53 mmHg (10/31 1232) Pulse Rate: 65 (10/31 1232) Intake/Output from previous day:   Intake/Output from this shift:    Labs:  Recent Labs  12/27/12 1132  WBC 8.1  HGB 13.2  PLT 225  CREATININE 1.15*   Estimated Creatinine Clearance: 32.3 ml/min (by C-G formula based on Cr of 1.15). No results found for this basename: VANCOTROUGH, VANCOPEAK, VANCORANDOM, GENTTROUGH, GENTPEAK, GENTRANDOM, TOBRATROUGH, TOBRAPEAK, TOBRARND, AMIKACINPEAK, AMIKACINTROU, AMIKACIN,  in the last 72 hours   Microbiology: No results found for this or any previous visit (from the past 720 hour(s)).  Medical History: Past Medical History  Diagnosis Date  . Diabetes mellitus   . Hypertension   . Hyperlipidemia   . Arthritis   . AF (atrial fibrillation)   . Cataract     decreased vision in left eye  . Peripheral vascular disease   . CHF (congestive heart failure)   . Stroke     Assessment: 79 y.o. F who presented with SOB and weakness. Noted recent admit in Aug/Sept 2014. Pharmacy was consulted to start Vancomycin along with Cefepime per MD for r/o HCAP. Afebrile, WBC wnl, SCr 1.15, CrCl~35 ml/min.   Goal of Therapy:   Vancomycin trough level 15-20 mcg/ml  Plan:  1. Vancomycin 1250 mg IV every 24 hours 2. Will continue to follow renal function, culture results, LOT, and antibiotic de-escalation plans   Georgina Pillion, PharmD, BCPS Clinical Pharmacist Pager: [phone] 12/27/2012 2:03 PM

## 2012-12-27 NOTE — ED Notes (Signed)
Patient transported to X-ray 

## 2012-12-27 NOTE — Progress Notes (Signed)
PT Cancellation Note  Patient Details Name: Leon Goodnow MRN: [id number] DOB: [birth year]   Cancelled Treatment:    Reason Eval/Treat Not Completed: Patient not medically ready.  PT order was written.  Patient was later placed on bedrest, and a Palliative Care consult was written.  Will hold PT at this time, and await outcome of Palliative Care consult. MD:  Please write OOB activity orders when appropriate for patient.  Thank you.   Vena Austria 12/27/2012, 6:26 PM Durenda Hurt. Renaldo Fiddler, [facility] Acute Rehab Services Pager [phone]

## 2012-12-27 NOTE — ED Notes (Signed)
Pt from home, per daughter pt has increase sob and weakness starting today. Daughter states struggling to breath sitting down after ambulating w/out recovering. Pt denies pain. AxO3, NSD

## 2012-12-27 NOTE — H&P (Addendum)
[facility] History and Physical  Deisi Salonga ZOX:[id number] DOB: [birth year] DOA: 12/27/2012  Referring physician: er PCP: [name], MD  Specialists:   Chief Complaint: SOB, not eating  HPI: [name] is a 79 y.o. female  Who recently lost her son.  Family says she has gone "downhill" since then.  She normally has some SOB with exertion but they have noticed that this has worsened. X-ray in ER showed worsening pleural effusion.  She has also not been eating, been increasing weakness.  No chest pain, no fever, no chills, + cough.   Family has noticed some tremor and thought she might be in atrial fib again.  Family states she has also been praying every night to die.  She has not been able to work with PT the way she had in the past.  In the Er, her BNP was elevated (although has been higher).  Chest x ray showed pleural effusion.  She was requiring 2L O2.   Review of Systems: all systems reviewed, negative unless stated above   Past Medical History  Diagnosis Date  . Diabetes mellitus   . Hypertension   . Hyperlipidemia   . Arthritis   . AF (atrial fibrillation)   . Cataract     decreased vision in left eye  . Peripheral vascular disease   . CHF (congestive heart failure)   . Stroke    Past Surgical History  Procedure Laterality Date  . Pacemaker insertion  2008  . Cholecystectomy    . Thyroid surgery     Social History:  reports that she has never smoked. She has never used smokeless tobacco. She reports that she does not drink alcohol or use illicit drugs.   Allergies  Allergen Reactions  . Nitrofurantoin Nausea And Vomiting  . Protonix [Pantoprazole Sodium] Nausea Only  . Sulfa Antibiotics Hives  . Trimethoprim Nausea Only    History reviewed. No pertinent family history.   Prior to Admission medications   Medication Sig Start Date End Date Taking? Authorizing Provider  amiodarone (PACERONE) 200 MG tablet Take 2 tablets (400 mg total) by  mouth daily. 12/26/12  Yes Everette Rank, MD  apixaban (ELIQUIS) 2.5 MG TABS tablet Take 1 tablet (2.5 mg total) by mouth 2 (two) times daily. 11/08/12  Yes Evlyn Kanner Love, PA-C  atorvastatin (LIPITOR) 20 MG tablet Take 20 mg by mouth daily.   Yes Historical Provider, MD  CALCIUM PO Take 600 mg by mouth 3 (three) times daily.   Yes Historical Provider, MD  furosemide (LASIX) 40 MG tablet Take 1 tablet (40 mg total) by mouth daily. 11/08/12  Yes Evlyn Kanner Love, PA-C  levETIRAcetam (KEPPRA) 500 MG tablet Take 1 tablet (500 mg total) by mouth 2 (two) times daily. 11/08/12  Yes Evlyn Kanner Love, PA-C  metFORMIN (GLUCOPHAGE-XR) 500 MG 24 hr tablet Take 1 tablet (500 mg total) by mouth 3 (three) times daily after meals. 11/08/12  Yes Evlyn Kanner Love, PA-C  metoprolol tartrate (LOPRESSOR) 25 MG tablet Take 25 mg by mouth 2 (two) times daily.   Yes Historical Provider, MD  potassium chloride SA (K-DUR,KLOR-CON) 20 MEQ tablet Take 1 tablet (20 mEq total) by mouth 2 (two) times daily. 11/08/12  Yes Evlyn Kanner Love, PA-C  sertraline (ZOLOFT) 25 MG tablet Take 25 mg by mouth daily.   Yes Historical Provider, MD   Physical Exam: Filed Vitals:   12/27/12 1232  BP: 152/53  Pulse: 65  Temp:   Resp:  General:  Very hard of hearing, no distress  Eyes: wnl  ENT: hearing aid in  Neck: supple, no JVD  Cardiovascular: regular  Respiratory: decreased breath sounds throughout  Abdomen: +BS, soft, NT- obese  Skin: senile changes; + edema b/l LE  Musculoskeletal: moves all 4 ext  Psychiatric: depressed affect- but will smile- seems removed  Neurologic: no focal deficit  Labs on Admission:  Basic Metabolic Panel:  Recent Labs Lab 12/27/12 1132  NA 138  K 3.5  CL 97  CO2 29  GLUCOSE 122*  BUN 15  CREATININE 1.15*  CALCIUM 7.6*   Liver Function Tests:  Recent Labs Lab 12/27/12 1132  AST 22  ALT 13  ALKPHOS 49  BILITOT 1.0  PROT 5.2*  ALBUMIN 2.7*   No results found for this basename:  LIPASE, AMYLASE,  in the last 168 hours No results found for this basename: AMMONIA,  in the last 168 hours CBC:  Recent Labs Lab 12/27/12 1132  WBC 8.1  NEUTROABS 5.6  HGB 13.2  HCT 38.2  MCV 91.6  PLT 225   Cardiac Enzymes: No results found for this basename: CKTOTAL, CKMB, CKMBINDEX, TROPONINI,  in the last 168 hours  BNP (last 3 results)  Recent Labs  10/29/12 0933 10/30/12 0611 12/27/12 1132  PROBNP 4185.0* 5109.0* 3125.0*   CBG: No results found for this basename: GLUCAP,  in the last 168 hours  Radiological Exams on Admission: Dg Chest 2 View  12/27/2012   CLINICAL DATA:  Shortness of breath.  EXAM: CHEST  2 VIEW  COMPARISON:  Two-view chest 11/07/2012  FINDINGS: The heart is enlarged. A left pleural effusion and basilar airspace disease has increased since the prior study. Mild pulmonary vascular congestion is evident. A small right pleural effusion is noted as well. Pacing wires are stable. Atherosclerotic calcifications are noted in the aorta. The upper lung fields are clear. Emphysematous changes are noted. Degenerative changes throughout the thoracic spine are stable.  IMPRESSION: 1. Increased left pleural effusion and basilar airspace disease. Pneumonia is not excluded. 2. Small right pleural effusion and associated atelectasis. 3. Emphysema. 4. Cardiomegaly and mild pulmonary vascular congestion without frank edema otherwise.   Electronically Signed   By: Gennette Pac M.D.   On: 12/27/2012 11:59    EKG: Independently reviewed. Junctional rhythm  Assessment/Plan Active Problems:   Diabetes   SOB (shortness of breath)   Diastolic congestive heart failure   1. SOB due to acute diastolic heart failure- IV lasix, daily weights, I/Os, mild elevation in BNP- family thought she was back in a fib a few days ago; on 2L [place], ? PNA- will not treat for a ? PNA- as no fevers- given abx in ER 2. Acute resp failure- on 2L, not on O2 at home 3. Recent loss of  son/depression/FTT- palliative care consult 4. DM- SSI 5. Left pleural effusion- see above 6. AKI- lasix gently 7. HTN- resume home meds  Palliative care  Code Status: DNR (discussed with family) Family Communication: patient and daughter Disposition Plan: home  Time spent: 93 min  Marlin Canary [facility] Pager [phone]  If 7PM-7AM, please contact night-coverage [email or web address] Password [email or web address] 12/27/2012, 2:23 PM

## 2012-12-27 NOTE — Progress Notes (Signed)
Late entry: Palliative Medicine Team consult for goals of care received spoke with patient and daughter [name] at bedside meeting scheduled for tomorrow, Saturday 12/28/12 @ 8:00 am -patient's son [name] will also be present for discussion.   Valente David, RN 12/27/2012, 7:03 PM Palliative Medicine Team RN Liaison [phone]

## 2012-12-28 ENCOUNTER — Encounter (HOSPITAL_COMMUNITY): Payer: Self-pay | Admitting: *Deleted

## 2012-12-28 DIAGNOSIS — R0602 Shortness of breath: Secondary | ICD-10-CM

## 2012-12-28 DIAGNOSIS — J189 Pneumonia, unspecified organism: Secondary | ICD-10-CM

## 2012-12-28 DIAGNOSIS — R0609 Other forms of dyspnea: Secondary | ICD-10-CM

## 2012-12-28 DIAGNOSIS — Z515 Encounter for palliative care: Secondary | ICD-10-CM

## 2012-12-28 DIAGNOSIS — I5041 Acute combined systolic (congestive) and diastolic (congestive) heart failure: Principal | ICD-10-CM

## 2012-12-28 DIAGNOSIS — F411 Generalized anxiety disorder: Secondary | ICD-10-CM

## 2012-12-28 LAB — GLUCOSE, CAPILLARY
Glucose-Capillary: 99 mg/dL (ref 70–99)
Glucose-Capillary: 164 mg/dL — ABNORMAL HIGH (ref 70–99)
Glucose-Capillary: 128 mg/dL — ABNORMAL HIGH (ref 70–99)
Glucose-Capillary: 175 mg/dL — ABNORMAL HIGH (ref 70–99)

## 2012-12-28 LAB — BASIC METABOLIC PANEL
BUN: 14 mg/dL (ref 6–23)
CO2: 25 mEq/L (ref 19–32)
Calcium: 7.4 mg/dL — ABNORMAL LOW (ref 8.4–10.5)
Chloride: 97 mEq/L (ref 96–112)
Glucose, Bld: 91 mg/dL (ref 70–99)
Potassium: 3.2 mEq/L — ABNORMAL LOW (ref 3.5–5.1)
Sodium: 138 mEq/L (ref 135–145)

## 2012-12-28 MED ORDER — FUROSEMIDE 10 MG/ML IJ SOLN
40.0000 mg | Freq: Two times a day (BID) | INTRAMUSCULAR | Status: DC
  Administered 2012-12-28 (×2): 40 mg via INTRAVENOUS
  Filled 2012-12-28 (×3): qty 4

## 2012-12-28 MED ORDER — POTASSIUM CHLORIDE CRYS ER 20 MEQ PO TBCR
40.0000 meq | EXTENDED_RELEASE_TABLET | Freq: Two times a day (BID) | ORAL | Status: AC
  Administered 2012-12-28 (×2): 40 meq via ORAL
  Filled 2012-12-28: qty 2

## 2012-12-28 MED ORDER — MAGNESIUM SULFATE 40 MG/ML IJ SOLN
4.0000 g | Freq: Once | INTRAMUSCULAR | Status: AC
  Administered 2012-12-28: 10:00:00 4 g via INTRAVENOUS
  Filled 2012-12-28: qty 100

## 2012-12-28 MED ORDER — BOOST / RESOURCE BREEZE PO LIQD
1.0000 | ORAL | Status: DC
  Administered 2012-12-28 – 2012-12-31 (×4): 1 via ORAL

## 2012-12-28 NOTE — Progress Notes (Signed)
[facility] PROGRESS NOTE Interim History: 79 y.o. female  Who recently lost her son. Family says she has gone "downhill" since then. She normally has some SOB with exertion but they have noticed that this has worsened. X-ray in ER showed worsening pleural effusion. She has also not been eating, been increasing weakness. No chest pain, no fever, no chills, + cough.  Family has noticed some tremor and thought she might be in atrial fib again. Family states she has also been praying every night to die. She has not been able to work with PT the way she had in the past.  Filed Weights   12/27/12 1300 12/27/12 1506 12/28/12 0500  Weight: 85.9 kg (189 lb 6 oz) 81.3 kg (179 lb 3.7 oz) 80.9 kg (178 lb 5.6 oz)        Intake/Output Summary (Last 24 hours) at 12/28/12 0757 Last data filed at 12/28/12 0656  Gross per 24 hour  Intake      0 ml  Output   1551 ml  Net  -1551 ml     Assessment/Plan:   SOB (shortness of breath) Diastolic congestive heart failure - On IV lasix, fluid restrict, low sodium. 80 kg. - monitor electrolytes and replete. - metoprolol, lasix.  Diabetes - well control.     Code Status: full Family Communication: none  Disposition Plan: inpatient.   Consultants:  none  Procedures: ECHO: 8.30.2014: ejection fraction was in the range of 55% to 65%, grade 3 diastolic dysfunction   Antibiotics:  none   HPI/Subjective: Relates she much improved, SOB almost at baseline.  Objective: Filed Vitals:   12/27/12 1506 12/27/12 1651 12/27/12 2118 12/28/12 0500  BP: 134/55 141/55 117/47 104/39  Pulse: 65 65 65 64  Temp: 97.3 F (36.3 C)  97.5 F (36.4 C) 97.2 F (36.2 C)  TempSrc: Oral  Oral Oral  Resp: 20  17 18   Height: 5\' 2"  (1.575 m)     Weight: 81.3 kg (179 lb 3.7 oz)   80.9 kg (178 lb 5.6 oz)  SpO2: 100%  93% 93%     Exam:  General: Alert, awake, oriented x3, in no acute distress.  HEENT: No bruits, no goiter. + JVD Heart: Regular rate and  rhythm, without murmurs, rubs, gallops.  Lungs: Good air movement, Clear to auscultation  Abdomen: Soft, nontender, nondistended, positive bowel sounds.  Neuro: Grossly intact, nonfocal.   Data Reviewed: Basic Metabolic Panel:  Recent Labs Lab 12/27/12 1132 12/28/12 0455  NA 138 138  K 3.5 3.2*  CL 97 97  CO2 29 25  GLUCOSE 122* 91  BUN 15 14  CREATININE 1.15* 0.99  CALCIUM 7.6* 7.4*  MG  --  0.7*   Liver Function Tests:  Recent Labs Lab 12/27/12 1132  AST 22  ALT 13  ALKPHOS 49  BILITOT 1.0  PROT 5.2*  ALBUMIN 2.7*   No results found for this basename: LIPASE, AMYLASE,  in the last 168 hours No results found for this basename: AMMONIA,  in the last 168 hours CBC:  Recent Labs Lab 12/27/12 1132  WBC 8.1  NEUTROABS 5.6  HGB 13.2  HCT 38.2  MCV 91.6  PLT 225   Cardiac Enzymes:  Recent Labs Lab 12/27/12 2020 12/28/12 0455  TROPONINI <0.30 <0.30   BNP (last 3 results)  Recent Labs  10/29/12 0933 10/30/12 0611 12/27/12 1132  PROBNP 4185.0* 5109.0* 3125.0*   CBG:  Recent Labs Lab 12/27/12 1641 12/27/12 2146  GLUCAP 107* 95  No results found for this or any previous visit (from the past 240 hour(s)).   Studies: Dg Chest 2 View  12/27/2012   CLINICAL DATA:  Shortness of breath.  EXAM: CHEST  2 VIEW  COMPARISON:  Two-view chest 11/07/2012  FINDINGS: The heart is enlarged. A left pleural effusion and basilar airspace disease has increased since the prior study. Mild pulmonary vascular congestion is evident. A small right pleural effusion is noted as well. Pacing wires are stable. Atherosclerotic calcifications are noted in the aorta. The upper lung fields are clear. Emphysematous changes are noted. Degenerative changes throughout the thoracic spine are stable.  IMPRESSION: 1. Increased left pleural effusion and basilar airspace disease. Pneumonia is not excluded. 2. Small right pleural effusion and associated atelectasis. 3. Emphysema. 4.  Cardiomegaly and mild pulmonary vascular congestion without frank edema otherwise.   Electronically Signed   By: Gennette Pac M.D.   On: 12/27/2012 11:59    Scheduled Meds: . amiodarone  400 mg Oral Daily  . apixaban  2.5 mg Oral BID  . atorvastatin  20 mg Oral q1800  . ceFEPime (MAXIPIME) IV  1 g Intravenous Once  . furosemide  40 mg Intravenous Once  . furosemide  40 mg Intravenous Daily  . insulin aspart  0-15 Units Subcutaneous TID WC  . insulin aspart  0-5 Units Subcutaneous QHS  . levETIRAcetam  500 mg Oral BID  . magnesium sulfate 1 - 4 g bolus IVPB  4 g Intravenous Once  . metoprolol tartrate  25 mg Oral BID  . potassium chloride SA  20 mEq Oral BID  . sertraline  25 mg Oral Daily  . sodium chloride  3 mL Intravenous Q12H  . vancomycin  1,250 mg Intravenous Q24H   Continuous Infusions:    Marinda Elk  [facility] Pager [phone]. If 8PM-8AM, please contact night-coverage at [email or web address], password [facility] 12/28/2012, 7:57 AM  LOS: 1 day

## 2012-12-28 NOTE — Consult Note (Addendum)
Patient ZO:XWRUE Johnson      DOB: [birth year]      AVW:[id number]     Consult Note from the Palliative Medicine Team at [facility]    Consult Requested by: Dr. Renee Johnson     PCP: Wanda Saupe, MD Reason for Consultation: Goals of care and related symptom recommendations     Phone Number:[phone]  Assessment of patients Current state: Patient is a 79 year old white female with known systolic heart failure who recently experienced a stroke with significant recovery. Her daughter has been caring for her at home. There family has experienced the recent loss of care brother/son to a brain tumor. The patient's health over the last week has had a sharp decline in that she's been more short of breath with exertion just walking a few steps. Her medications were adjusted by her cardiologist as an outpatient but the symptoms became significant enough overnight that she was brought to the emergency room for further care. The patient expressed in my presence that her desire has been to focus on comfort instead of coming back and forth to the hospital. She was pragmatic enough with her family to say that she would prefer just to infirmity. Her son and daughter therefore attempting to act on her behalf in with her approval to provide comfort care in her home. I met with the patient's son, [name],  who has medical power of attorney, and her daughter [name] who has been the primary caregiver but does need to get back to her home in [place] at some point. The patient's children have elected to present to their mother the use of hospice services for the purpose of palliation. They have attempted to help her understand the use of hospice services for palliation and are trying to support her positive spirit by pointing out the chronic nature of the care needed. They have expressed to her that he do not believe she is dying right at this moment which is important for her psychological state at this time. They've  emphasized the use of hospice services for palliation to give support to [name] who is caring for her at home and as a mechanism for honoring her wish to not prolong her life in an unmeaningful way.   Their wishes therefore are to optimize her symptoms over the weekend and discharged to home on Monday with hospice services. I believe that her heart failure may result in her decline over the next 6 months if comfort is instituted.                                      Goals of Care: 1.  Code Status: DO NOT RESUSCITATE   2. Scope of Treatment: At this time the family is allowing antibiotics to continue along with optimization of her heart medications. They were a few a MOST form to identify it long-term goals prior to discharge.   4. Disposition: Home with hospice services   3. Symptom Management:   1. Anxiety/Agitation: Management of the patient's shortness of breath will likely improve what family is proceeding as anxiety. The patient is currently being treated for depression. Ativan could be used in the future if the anxiety becomes significant. Family would like to limit the number of medications introduced but recognizes a role for them as the patient meters end-of-life. 2. Johnson: Not currently an issue 3. Dyspnea: Discharging medications could include when necessary Roxanol  for dyspnea 20 mg per mL 5 mg sublingual q. 2-4 hours when necessary dispense 30 mL 4. Depression continue Zoloft 5. Congestive heart failure exacerbation of systolic component. Continue rate control of underlying A. fib and Lasix per primary team. Consider home oxygen for relief of symptoms. Patient has not been using oxygen prior to admission.  4. Psychosocial: Patient is originally from [place] she is described as a strong person but has recently experienced significant loss including her husband 3 years ago and her son in the last 2 weeks.  5. Spiritual: Not addressed during this visit but will offer chaplain  services        Patient Documents Completed or Given: Document Given Completed  Advanced Directives Pkt    MOST    DNR    Gone from My Sight    Hard Choices      Brief HPI: Patient is a 79 year old white female with a known past medical history for systolic heart failure, atrial fibrillation, and recent stroke from which she made remarkable recovery. Patient recently lost her son to a brain tumor and has appropriately express her grief over this to her family. Overall with a decline in her health especially after her stroke she has expressed that she would prefer just to living with infirmity. I've been asked to assist with goals of care and related symptom recommendations   ROS: Currently patient expresses no shortness of breath chest Johnson nausea vomiting or diarrhea. Of note she has been more short of breath with exertion over the past week. Adjustments have been made to her medications as an outpatient    PMH:  Past Medical History  Diagnosis Date  . Diabetes mellitus   . Hypertension   . Hyperlipidemia   . Arthritis   . AF (atrial fibrillation)   . Cataract     decreased vision in left eye  . Peripheral vascular disease   . CHF (congestive heart failure)   . Stroke      PSH: Past Surgical History  Procedure Laterality Date  . Pacemaker insertion  2008  . Cholecystectomy    . Thyroid surgery     I have reviewed the FH and SH and  If appropriate update it with new information. Allergies  Allergen Reactions  . Nitrofurantoin Nausea And Vomiting  . Protonix [Pantoprazole Sodium] Nausea Only  . Sulfa Antibiotics Hives  . Trimethoprim Nausea Only   Scheduled Meds: . amiodarone  400 mg Oral Daily  . apixaban  2.5 mg Oral BID  . atorvastatin  20 mg Oral q1800  . ceFEPime (MAXIPIME) IV  1 g Intravenous Once  . furosemide  40 mg Intravenous Once  . furosemide  40 mg Intravenous Q12H  . insulin aspart  0-15 Units Subcutaneous TID WC  . insulin aspart  0-5 Units  Subcutaneous QHS  . levETIRAcetam  500 mg Oral BID  . magnesium sulfate 1 - 4 g bolus IVPB  4 g Intravenous Once  . metoprolol tartrate  25 mg Oral BID  . potassium chloride  40 mEq Oral BID  . sertraline  25 mg Oral Daily  . sodium chloride  3 mL Intravenous Q12H   Continuous Infusions:  PRN Meds:.sodium chloride, acetaminophen, ondansetron (ZOFRAN) IV, sodium chloride    BP 104/39  Pulse 64  Temp(Src) 97.2 F (36.2 C) (Oral)  Resp 18  Ht 5\' 2"  (1.575 m)  Wt 80.9 kg (178 lb 5.6 oz)  BMI 32.61 kg/m2  SpO2 93%   PPS:  40%   Intake/Output Summary (Last 24 hours) at 12/28/12 1104 Last data filed at 12/28/12 1610  Gross per 24 hour  Intake      0 ml  Output   1576 ml  Net  -1576 ml   LBM: 12/28/2012                       Physical Exam:  General: no acute distress, full range of emtions, hard of hearing memory appears to be intact as tested by her conversation with her son HEENT:  Pupils appear equal round and reactive to light, extra muscles appear to be intact because membranes appear to be moist uses a hearing aid Chest:   Decreased but clear anteriorly I didn't appreciate any rhonchi rales or wheezes some crackles bibasilarly on the posterior aspect of the lung CVS: Distant heart sounds appear regular, S1 and S2 Abdomen: Obese soft nontender nondistended with positive bowel sounds Ext: Trace nonpitting lower should the edema with some hyperpigmentation bilaterally to the lower extremities Neuro: Awake alert oriented to self and family appears to have capacity for decision making based on her conversation between her family and myself regarding her desires for overall comfort care  Labs: CBC    Component Value Date/Time   WBC 8.1 12/27/2012 1132   RBC 4.17 12/27/2012 1132   HGB 13.2 12/27/2012 1132   HCT 38.2 12/27/2012 1132   PLT 225 12/27/2012 1132   MCV 91.6 12/27/2012 1132   MCH 31.7 12/27/2012 1132   MCHC 34.6 12/27/2012 1132   RDW 15.6* 12/27/2012 1132    LYMPHSABS 1.5 12/27/2012 1132   MONOABS 0.9 12/27/2012 1132   EOSABS 0.1 12/27/2012 1132   BASOSABS 0.0 12/27/2012 1132      CMP     Component Value Date/Time   NA 138 12/28/2012 0455   K 3.2* 12/28/2012 0455   CL 97 12/28/2012 0455   CO2 25 12/28/2012 0455   GLUCOSE 91 12/28/2012 0455   BUN 14 12/28/2012 0455   CREATININE 0.99 12/28/2012 0455   CALCIUM 7.4* 12/28/2012 0455   PROT 5.2* 12/27/2012 1132   ALBUMIN 2.7* 12/27/2012 1132   AST 22 12/27/2012 1132   ALT 13 12/27/2012 1132   ALKPHOS 49 12/27/2012 1132   BILITOT 1.0 12/27/2012 1132   GFRNONAA 48* 12/28/2012 0455   GFRAA 56* 12/28/2012 0455    Chest Xray Reviewed/Impressions: Bibasilar opacities and pleural effusion on the left     Time In Time Out Total Time Spent with Patient Total Overall Time  800 am 900 am 20 min 60 min    Greater than 50%  of this time was spent counseling and coordinating care related to the above assessment and plan.   Shriyans Kuenzi L. Ladona Ridgel, MD MBA The Palliative Medicine Team at [facility] Phone: [phone] Pager: [phone]

## 2012-12-28 NOTE — Progress Notes (Signed)
INITIAL NUTRITION ASSESSMENT  DOCUMENTATION CODES Per approved criteria  -Severe  malnutrition in the context of social or environmental circumstances   INTERVENTION: Monitor magnesium, potassium, and phosphorus daily for at least 3 days, MD to replete as needed, as pt is at risk for refeeding syndrome given severe malnutrition. Add Resource Breeze po daily, each supplement provides 250 kcal and 9 grams of protein. RD to continue to follow nutrition care plan.  NUTRITION DIAGNOSIS: Inadequate oral intake related to poor appetite and depression as evidenced by dietary recall.   Goal: Intake to meet >90% of estimated nutrition needs.  Monitor:  weight trends, lab trends, I/O's, PO intake, supplement tolerance  Reason for Assessment: Malnutrition Screening Tool  79 y.o. female  Admitting Dx:   ASSESSMENT: PMHx significant for DM, HTN, CHF. Admitted with SOB with exertion, poor oral intake and weakness. Work-up reveals acute heart failure.  Pt with depression 2/2 recent loss of son. Per MD notes, "pt has been praying every night to die." Pt with 9% wt loss x 3 months. Discussed oral intake with family at bedside. Pt with ongoing poor oral intake, per daughter, if pt eats bites at her meals they are pleased with her intake. She does not like "creamy" things such as yogurt, pudding or Ensure. Agreeable to trying [employer]. Discussed that she could also do Ensure Clear when d/c.  Family denied any further nutrition questions/concerns.  Pt meets criteria for severe MALNUTRITION in the context of social/environmental circumstances as evidenced by intake of <50% x at least 1 month and 9% wt loss x 3 months.   Height: Ht Readings from Last 1 Encounters:  12/27/12 5\' 2"  (1.575 m)    Weight: Wt Readings from Last 1 Encounters:  12/28/12 178 lb 5.6 oz (80.9 kg)    Ideal Body Weight: 110 lb  % Ideal Body Weight: 162%  Wt Readings from Last 10 Encounters:  12/28/12 178 lb 5.6  oz (80.9 kg)  12/03/12 189 lb 6.4 oz (85.911 kg)  11/06/12 186 lb 8.2 oz (84.6 kg)  10/26/12 196 lb 3.4 oz (89 kg)  10/16/12 199 lb 8.3 oz (90.5 kg)  04/10/11 193 lb (87.544 kg)    Usual Body Weight: 195 lb  % Usual Body Weight: 91%  BMI:  Body mass index is 32.61 kg/(m^2). Obese Class I  Estimated Nutritional Needs: Kcal: 1500 - 1700 Protein: 70 - 85 g Fluid: 1.5 - 1.7 liters  Skin: intact  Diet Order: General  EDUCATION NEEDS: -No education needs identified at this time   Intake/Output Summary (Last 24 hours) at 12/28/12 1321 Last data filed at 12/28/12 1310  Gross per 24 hour  Intake      0 ml  Output   1626 ml  Net  -1626 ml    Last BM: 10/31  Labs:   Recent Labs Lab 12/27/12 1132 12/28/12 0455  NA 138 138  K 3.5 3.2*  CL 97 97  CO2 29 25  BUN 15 14  CREATININE 1.15* 0.99  CALCIUM 7.6* 7.4*  MG  --  0.7*  GLUCOSE 122* 91    CBG (last 3)   Recent Labs  12/27/12 2146 12/28/12 0623 12/28/12 1127  GLUCAP 95 99 164*    Scheduled Meds: . amiodarone  400 mg Oral Daily  . apixaban  2.5 mg Oral BID  . atorvastatin  20 mg Oral q1800  . ceFEPime (MAXIPIME) IV  1 g Intravenous Once  . furosemide  40 mg Intravenous Once  .  furosemide  40 mg Intravenous Q12H  . insulin aspart  0-15 Units Subcutaneous TID WC  . insulin aspart  0-5 Units Subcutaneous QHS  . levETIRAcetam  500 mg Oral BID  . metoprolol tartrate  25 mg Oral BID  . potassium chloride  40 mEq Oral BID  . sertraline  25 mg Oral Daily  . sodium chloride  3 mL Intravenous Q12H    Continuous Infusions:   Past Medical History  Diagnosis Date  . Diabetes mellitus   . Hypertension   . Hyperlipidemia   . Arthritis   . AF (atrial fibrillation)   . Cataract     decreased vision in left eye  . Peripheral vascular disease   . CHF (congestive heart failure)   . Stroke     Past Surgical History  Procedure Laterality Date  . Pacemaker insertion  2008  . Cholecystectomy    .  Thyroid surgery      Jarold Motto MS, RD, LDN Pager: [phone] After-hours pager: [phone]

## 2012-12-29 LAB — BASIC METABOLIC PANEL
BUN: 13 mg/dL (ref 6–23)
Calcium: 7.5 mg/dL — ABNORMAL LOW (ref 8.4–10.5)
Chloride: 98 mEq/L (ref 96–112)
Creatinine, Ser: 1.02 mg/dL (ref 0.50–1.10)
GFR calc Af Amer: 54 mL/min — ABNORMAL LOW (ref >90)
Glucose, Bld: 152 mg/dL — ABNORMAL HIGH (ref 70–99)

## 2012-12-29 LAB — GLUCOSE, CAPILLARY
Glucose-Capillary: 143 mg/dL — ABNORMAL HIGH (ref 70–99)
Glucose-Capillary: 157 mg/dL — ABNORMAL HIGH (ref 70–99)

## 2012-12-29 MED ORDER — FUROSEMIDE 10 MG/ML IJ SOLN
40.0000 mg | Freq: Three times a day (TID) | INTRAMUSCULAR | Status: DC
  Administered 2012-12-29 (×2): 40 mg via INTRAVENOUS
  Filled 2012-12-29 (×3): qty 4

## 2012-12-29 NOTE — Progress Notes (Signed)
[facility] PROGRESS NOTE Interim History: 78 y.o. female  Who recently lost her son. Family says she has gone "downhill" since then. She normally has some SOB with exertion but they have noticed that this has worsened. X-ray in ER showed worsening pleural effusion. She has also not been eating, been increasing weakness. No chest pain, no fever, no chills, + cough.  Family has noticed some tremor and thought she might be in atrial fib again. Family states she has also been praying every night to die. She has not been able to work with PT the way she had in the past.  Filed Weights   12/27/12 1506 12/28/12 0500 12/29/12 0705  Weight: 81.3 kg (179 lb 3.7 oz) 80.9 kg (178 lb 5.6 oz) 79.7 kg (175 lb 11.3 oz)        Intake/Output Summary (Last 24 hours) at 12/29/12 0829 Last data filed at 12/29/12 0738  Gross per 24 hour  Intake    120 ml  Output   1750 ml  Net  -1630 ml     Assessment/Plan:  SOB (shortness of breath) Diastolic congestive heart failure - Increase IV lasix, fluid restrict, low sodium. 79 kg. - monitor electrolytes and replete. b-met pending this am. - metoprolol, lasix.  Diabetes - well control. - cont current regimen.     Code Status: full Family Communication: none  Disposition Plan: inpatient.   Consultants:  none  Procedures: ECHO: 8.30.2014: ejection fraction was in the range of 55% to 65%, grade 3 diastolic dysfunction   Antibiotics:  none   HPI/Subjective:  SOB almost at baseline.  Objective: Filed Vitals:   12/28/12 1300 12/28/12 2137 12/29/12 0208 12/29/12 0705  BP: 122/57 117/48 109/41 120/44  Pulse: 67 63 66 64  Temp: 97.6 F (36.4 C) 97.4 F (36.3 C) 97.5 F (36.4 C) 97.8 F (36.6 C)  TempSrc: Oral Oral Oral Oral  Resp: 18 17 18 17   Height:      Weight:    79.7 kg (175 lb 11.3 oz)  SpO2: 95% 95% 96% 94%     Exam:  General: Alert, awake, oriented x3, in no acute distress.  HEENT: No bruits, no goiter.no JVD Heart:  Regular rate and rhythm, without murmurs, rubs, gallops.  Lungs: Good air movement, Clear to auscultation  Abdomen: Soft, nontender, nondistended, positive bowel sounds.     Data Reviewed: Basic Metabolic Panel:  Recent Labs Lab 12/27/12 1132 12/28/12 0455  NA 138 138  K 3.5 3.2*  CL 97 97  CO2 29 25  GLUCOSE 122* 91  BUN 15 14  CREATININE 1.15* 0.99  CALCIUM 7.6* 7.4*  MG  --  0.7*   Liver Function Tests:  Recent Labs Lab 12/27/12 1132  AST 22  ALT 13  ALKPHOS 49  BILITOT 1.0  PROT 5.2*  ALBUMIN 2.7*   No results found for this basename: LIPASE, AMYLASE,  in the last 168 hours No results found for this basename: AMMONIA,  in the last 168 hours CBC:  Recent Labs Lab 12/27/12 1132  WBC 8.1  NEUTROABS 5.6  HGB 13.2  HCT 38.2  MCV 91.6  PLT 225   Cardiac Enzymes:  Recent Labs Lab 12/27/12 2020 12/28/12 0455  TROPONINI <0.30 <0.30   BNP (last 3 results)  Recent Labs  10/29/12 0933 10/30/12 0611 12/27/12 1132  PROBNP 4185.0* 5109.0* 3125.0*   CBG:  Recent Labs Lab 12/28/12 0623 12/28/12 1127 12/28/12 1603 12/28/12 2130 12/29/12 0559  GLUCAP 99 164* 128* 175*  141*    No results found for this or any previous visit (from the past 240 hour(s)).   Studies: Dg Chest 2 View  12/27/2012   CLINICAL DATA:  Shortness of breath.  EXAM: CHEST  2 VIEW  COMPARISON:  Two-view chest 11/07/2012  FINDINGS: The heart is enlarged. A left pleural effusion and basilar airspace disease has increased since the prior study. Mild pulmonary vascular congestion is evident. A small right pleural effusion is noted as well. Pacing wires are stable. Atherosclerotic calcifications are noted in the aorta. The upper lung fields are clear. Emphysematous changes are noted. Degenerative changes throughout the thoracic spine are stable.  IMPRESSION: 1. Increased left pleural effusion and basilar airspace disease. Pneumonia is not excluded. 2. Small right pleural effusion and  associated atelectasis. 3. Emphysema. 4. Cardiomegaly and mild pulmonary vascular congestion without frank edema otherwise.   Electronically Signed   By: Gennette Pac M.D.   On: 12/27/2012 11:59    Scheduled Meds: . amiodarone  400 mg Oral Daily  . apixaban  2.5 mg Oral BID  . atorvastatin  20 mg Oral q1800  . ceFEPime (MAXIPIME) IV  1 g Intravenous Once  . feeding supplement (RESOURCE BREEZE)  1 Container Oral Q24H  . furosemide  40 mg Intravenous Once  . furosemide  40 mg Intravenous Q12H  . insulin aspart  0-15 Units Subcutaneous TID WC  . insulin aspart  0-5 Units Subcutaneous QHS  . levETIRAcetam  500 mg Oral BID  . metoprolol tartrate  25 mg Oral BID  . sertraline  25 mg Oral Daily  . sodium chloride  3 mL Intravenous Q12H   Continuous Infusions:    Marinda Elk  [facility] Pager [phone]. If 8PM-8AM, please contact night-coverage at [email or web address], password [facility] 12/29/2012, 8:29 AM  LOS: 2 days

## 2012-12-29 NOTE — Progress Notes (Signed)
Patient [name]      DOB: 27-Feb-1919      VOZ:[id number]   Palliative Medicine Team at [name] Progress Note    Subjective: Patient resting comfortably in the chair no acute distress. Spoke with her daughter by phone . The family has further communicated and decided that they would Johnson to discharge to home without hospice set up. They have an appointment with Dr. Jim Johnson and at that time if they feel that they want to ask hospice to eval then they can initiate the referral themselves.  They have a MOST form to complete.    Filed Vitals:   12/29/12 0705  BP: 120/44  Pulse: 64  Temp: 97.8 F (36.6 C)  Resp: 17   Physical exam:   defered        Assessment and plan:family desire hime when medically stable with the ability to self refer to hospice.      [name] L. Wanda Ridgel, MD MBA The Palliative Medicine Team at [facility] Phone: [phone] Pager: [phone]

## 2012-12-29 NOTE — Progress Notes (Signed)
   CARE MANAGEMENT NOTE 12/29/2012  Patient:  [facility]   Account Number:  [id number]  Date Initiated:  12/29/2012  Documentation initiated by:  [facility]  Subjective/Objective Assessment:   adm:  Shortness of Breath  .  Weakness     Action/Plan:   discharge planning   Anticipated DC Date:  12/30/2012   Anticipated DC Plan:  HOME W HOSPICE CARE      DC Planning Services  CM consult      Choice offered to / List presented to:             Status of service:  In process, will continue to follow Medicare Important Message given?   (If response is "NO", the following Medicare IM given date fields will be blank) Date Medicare IM given:   Date Additional Medicare IM given:    Discharge Disposition:    Per UR Regulation:    If discussed at Long Length of Stay Meetings, dates discussed:    Comments:  12/29/12 15:00 Cm met with daughter, Olegario Messier and pt in room to discuss choice for Home Hospice Care.  List was given to [facility].  Olegario Messier explained the family was not in consensus as to pursuing Home Hospice tomorrow at discharge.  Olegario Messier does state she feels that after the pt's followup visit scheduled for this upcoming week, 11/5 with Dr. Forest Gleason, the family would make that decision together.  She also states [facility] took care of their father, who died from [place] and they were very pleased and will most like this ageny to provide the service when the time comes.  RN informed of change of mind for disposition of pt and states she will contact MD for [facility] orders.  Pt was active with [facility] and RN to get orders for necessary [facility] services.  Will continue to follow for discharge needs.  Freddy Jaksch, BSN, [place] [phone].  12/29/12 07:50 CM met with pt in room to discuss post discharge plans.  Pt very HOH and gave CM daughter, Olegario Messier [phone], permission to speak on her behalf.  Pt also states she has a son, Darryl who is her medical POA.

## 2012-12-30 LAB — BASIC METABOLIC PANEL
BUN: 14 mg/dL (ref 6–23)
BUN: 14 mg/dL (ref 6–23)
CO2: 29 mEq/L (ref 19–32)
Calcium: 7.9 mg/dL — ABNORMAL LOW (ref 8.4–10.5)
Chloride: 99 mEq/L (ref 96–112)
Chloride: 99 mEq/L (ref 96–112)
Creatinine, Ser: 0.99 mg/dL (ref 0.50–1.10)
Creatinine, Ser: 1.27 mg/dL — ABNORMAL HIGH (ref 0.50–1.10)
GFR calc Af Amer: 56 mL/min — ABNORMAL LOW (ref >90)
GFR calc non Af Amer: 36 mL/min — ABNORMAL LOW (ref >90)
Glucose, Bld: 142 mg/dL — ABNORMAL HIGH (ref 70–99)
Potassium: 3.2 mEq/L — ABNORMAL LOW (ref 3.5–5.1)
Potassium: 3.6 mEq/L (ref 3.5–5.1)
Sodium: 137 mEq/L (ref 135–145)
Sodium: 140 mEq/L (ref 135–145)

## 2012-12-30 LAB — GLUCOSE, CAPILLARY: Glucose-Capillary: 227 mg/dL — ABNORMAL HIGH (ref 70–99)

## 2012-12-30 MED ORDER — FUROSEMIDE 10 MG/ML IJ SOLN
80.0000 mg | Freq: Two times a day (BID) | INTRAMUSCULAR | Status: DC
  Administered 2012-12-30 – 2012-12-31 (×3): 80 mg via INTRAVENOUS
  Filled 2012-12-30 (×3): qty 8

## 2012-12-30 MED ORDER — FUROSEMIDE 10 MG/ML IJ SOLN: 60.0000 mg | Freq: Three times a day (TID) | INTRAMUSCULAR | Status: DC

## 2012-12-30 MED ORDER — POTASSIUM CHLORIDE CRYS ER 20 MEQ PO TBCR
40.0000 meq | EXTENDED_RELEASE_TABLET | Freq: Two times a day (BID) | ORAL | Status: AC
  Administered 2012-12-30 (×2): 40 meq via ORAL
  Filled 2012-12-30 (×2): qty 2

## 2012-12-30 NOTE — Progress Notes (Signed)
[facility] PROGRESS NOTE Interim History: 79 y.o. female  Who recently lost her son. Family says she has gone "downhill" since then. She normally has some SOB with exertion but they have noticed that this has worsened. X-ray in ER showed worsening pleural effusion. She has also not been eating, been increasing weakness. No chest pain, no fever, no chills, + cough.  Family has noticed some tremor and thought she might be in atrial fib again. Family states she has also been praying every night to die. She has not been able to work with PT the way she had in the past.  Filed Weights   12/28/12 0500 12/29/12 0705 12/30/12 0542  Weight: 80.9 kg (178 lb 5.6 oz) 79.7 kg (175 lb 11.3 oz) 80.1 kg (176 lb 9.4 oz)        Intake/Output Summary (Last 24 hours) at 12/30/12 0827 Last data filed at 12/30/12 2130  Gross per 24 hour  Intake    723 ml  Output   1075 ml  Net   -352 ml     Assessment/Plan:  SOB (shortness of breath) due to acute combine systolic and Diastolic congestive heart failure: - Increase IV lasix, fluid restrict, mild increase in weight. - KCL orally. - metoprolol, lasix.  Diabetes - well control. - cont current regimen.     Code Status: full Family Communication: none  Disposition Plan: inpatient.   Consultants:  none  Procedures: ECHO: 8.30.2014: ejection fraction was in the range of 55% to 65%, grade 3 diastolic dysfunction   Antibiotics:  none   HPI/Subjective:  SOB almost at baseline.  Objective: Filed Vitals:   12/29/12 0705 12/29/12 1434 12/29/12 2202 12/30/12 0542  BP: 120/44 103/32 118/62 106/39  Pulse: 64 70 65 68  Temp: 97.8 F (36.6 C) 97.8 F (36.6 C) 97.4 F (36.3 C) 98.3 F (36.8 C)  TempSrc: Oral Oral Oral Oral  Resp: 17 18  18   Height:      Weight: 79.7 kg (175 lb 11.3 oz)   80.1 kg (176 lb 9.4 oz)  SpO2: 94% 93% 94% 94%     Exam:  General: Alert, awake, oriented x3, in no acute distress.  HEENT: No bruits, no  goiter. + JVD Heart: Regular rate and rhythm, without murmurs, rubs, gallops.  Lungs: Good air movement, Clear to auscultation  Abdomen: Soft, nontender, nondistended, positive bowel sounds.     Data Reviewed: Basic Metabolic Panel:  Recent Labs Lab 12/27/12 1132 12/28/12 0455 12/29/12 0848 12/30/12 0525  NA 138 138 138 137  K 3.5 3.2* 3.3* 3.2*  CL 97 97 98 99  CO2 29 25 28 28   GLUCOSE 122* 91 152* 142*  BUN 15 14 13 14   CREATININE 1.15* 0.99 1.02 0.99  CALCIUM 7.6* 7.4* 7.5* 7.5*  MG  --  0.7*  --   --    Liver Function Tests:  Recent Labs Lab 12/27/12 1132  AST 22  ALT 13  ALKPHOS 49  BILITOT 1.0  PROT 5.2*  ALBUMIN 2.7*   No results found for this basename: LIPASE, AMYLASE,  in the last 168 hours No results found for this basename: AMMONIA,  in the last 168 hours CBC:  Recent Labs Lab 12/27/12 1132  WBC 8.1  NEUTROABS 5.6  HGB 13.2  HCT 38.2  MCV 91.6  PLT 225   Cardiac Enzymes:  Recent Labs Lab 12/27/12 2020 12/28/12 0455  TROPONINI <0.30 <0.30   BNP (last 3 results)  Recent Labs  10/29/12  2952 10/30/12 0611 12/27/12 1132  PROBNP 4185.0* 5109.0* 3125.0*   CBG:  Recent Labs Lab 12/29/12 0559 12/29/12 1201 12/29/12 1637 12/29/12 2150 12/30/12 0617  GLUCAP 141* 133* 143* 157* 136*    No results found for this or any previous visit (from the past 240 hour(s)).   Studies: No results found.  Scheduled Meds: . amiodarone  400 mg Oral Daily  . apixaban  2.5 mg Oral BID  . atorvastatin  20 mg Oral q1800  . ceFEPime (MAXIPIME) IV  1 g Intravenous Once  . feeding supplement (RESOURCE BREEZE)  1 Container Oral Q24H  . furosemide  40 mg Intravenous Once  . furosemide  60 mg Intravenous Q8H  . insulin aspart  0-15 Units Subcutaneous TID WC  . insulin aspart  0-5 Units Subcutaneous QHS  . levETIRAcetam  500 mg Oral BID  . metoprolol tartrate  25 mg Oral BID  . potassium chloride  40 mEq Oral BID  . sertraline  25 mg Oral Daily   . sodium chloride  3 mL Intravenous Q12H   Continuous Infusions:    Marinda Elk  [facility] Pager [phone]. If 8PM-8AM, please contact night-coverage at [email or web address], password [email or web address] 12/30/2012, 8:27 AM  LOS: 3 days

## 2012-12-30 NOTE — Evaluation (Signed)
Physical Therapy Evaluation Patient Details Name: [name] MRN: [id number] DOB: 03/04/1918 Today's Date: 12/30/2012 Time: 4098-1191 PT Time Calculation (min): 29 min  PT Assessment / Plan / Recommendation History of Present Illness  79 y.o. female  Who recently lost her son.  Family says she has gone "downhill" since then.  She normally has some SOB with exertion but they have noticed that this has worsened. X-ray in ER showed worsening pleural effusion.  She has also not been eating, been increasing weakness.  Clinical Impression  Pt pleasant with dgtr present throughout. Pt and dgtr educated for transfers and ease of transition at home. Pt with below deficits and will benefit from acute therapy to maximize mobility, strength and function to decrease burden of care. Pt encouraged to ambulate to the bathroom, be in chair for all meals and continue HEP.     PT Assessment  Patient needs continued PT services    Follow Up Recommendations  Home health PT;Supervision for mobility/OOB    Does the patient have the potential to tolerate intense rehabilitation      Barriers to Discharge        Equipment Recommendations  None recommended by PT    Recommendations for Other Services     Frequency Min 2X/week    Precautions / Restrictions Precautions Precautions: Fall   Pertinent Vitals/Pain No pain HR 64-71 with sats maintained 98% on RA with all activity     Mobility  Bed Mobility Bed Mobility: Not assessed Details for Bed Mobility Assistance: pt in recliner on arrival Transfers Transfers: Sit to Stand;Stand to Sit Sit to Stand: 4: Min assist;With armrests;From chair/3-in-1 Stand to Sit: 4: Min assist;To chair/3-in-1;With armrests Details for Transfer Assistance: cueing for scooting to edge of surface and anterior translation to ease transfer. With descent cueing to control descent at pt "plops" to surface Ambulation/Gait Ambulation/Gait Assistance: 4: Min  guard Ambulation Distance (Feet): 130 Feet Assistive device: Rolling walker Ambulation/Gait Assistance Details: pt remains flexed over RW with forearms on handles of RW and unable to stand erect. Pt and dgtr report pt has been walking like this at home despite education from HHPT Gait Pattern: Step-through pattern;Decreased stride length;Trunk flexed Gait velocity: decreased Stairs: No    Exercises     PT Diagnosis: Difficulty walking;Generalized weakness  PT Problem List: Decreased strength;Decreased activity tolerance;Decreased mobility;Decreased knowledge of use of DME PT Treatment Interventions: DME instruction;Gait training;Functional mobility training;Therapeutic activities;Therapeutic exercise;Patient/family education     PT Goals(Current goals can be found in the care plan section) Acute Rehab PT Goals Patient Stated Goal: be able to return home PT Goal Formulation: With patient/family Time For Goal Achievement: 01/13/13 Potential to Achieve Goals: Fair  Visit Information  Last PT Received On: 12/30/12 Assistance Needed: +1 History of Present Illness: 79 y.o. female  Who recently lost her son.  Family says she has gone "downhill" since then.  She normally has some SOB with exertion but they have noticed that this has worsened. X-ray in ER showed worsening pleural effusion.  She has also not been eating, been increasing weakness.       Prior Functioning  Home Living Family/patient expects to be discharged to:: Private residence Living Arrangements: Children Available Help at Discharge: Family;Available 24 hours/day Type of Home: House Home Access: Stairs to enter [employer] of Steps: 1 Entrance Stairs-Rails: None Home Layout: One level Home Equipment: Walker - 4 wheels;Shower seat;Grab bars - tub/shower;Grab bars - toilet;Toilet riser Additional Comments: Dgtr from [facility] has been staying with  pt since Aug and states she can stay potentially until April but  will have to return home at some point Prior Function Level of Independence: Needs assistance Gait / Transfers Assistance Needed: uses rollator in home, limited outside amb - someones accompanies pt. pt at times requires min assist for transfers and gait with max grossly 100' at a time ADL's / Homemaking Assistance Needed: family does shopping and housework, dgtr assists with bathing and dressing Communication Communication: HOH    Cognition  Cognition Arousal/Alertness: Awake/alert Behavior During Therapy: WFL for tasks assessed/performed Overall Cognitive Status: Within Functional Limits for tasks assessed    Extremity/Trunk Assessment Upper Extremity Assessment Upper Extremity Assessment: Generalized weakness Lower Extremity Assessment Lower Extremity Assessment: Generalized weakness Cervical / Trunk Assessment Cervical / Trunk Assessment: Kyphotic   Balance    End of Session PT - End of Session Equipment Utilized During Treatment: Gait belt Activity Tolerance: Patient tolerated treatment well Patient left: in chair;with call bell/phone within reach;with family/visitor present  GP     Toney Sang Beth 12/30/2012, 12:46 PM Delaney Meigs, PT [phone]

## 2012-12-30 NOTE — Progress Notes (Signed)
Utilization Review Completed.[name] T11/04/2012  

## 2012-12-30 NOTE — Progress Notes (Signed)
Pt refusing to use bed side commode. Pt urine count inaccurate d/t pt missing hat when voiding. Darcel Bayley, Malachy Chamber, RN

## 2012-12-30 NOTE — ED Provider Notes (Signed)
Medical screening examination/treatment/procedure(s) were performed by non-physician practitioner and as supervising physician I was immediately available for consultation/collaboration.   Toy Baker, MD 12/30/12 [phone]

## 2012-12-30 NOTE — Progress Notes (Signed)
Held metoprolol unless further orders are instructed to give. Notified attending MD via text page. MD is aware and stated that was ok. Instructed to make sure to given metoprolol tonight and continue to monitor blood pressure. Will continue to monitor blood pressure and notify oncoming shift.

## 2012-12-31 ENCOUNTER — Other Ambulatory Visit: Payer: Self-pay | Admitting: Interventional Cardiology

## 2012-12-31 DIAGNOSIS — E871 Hypo-osmolality and hyponatremia: Secondary | ICD-10-CM

## 2012-12-31 LAB — GLUCOSE, CAPILLARY
Glucose-Capillary: 124 mg/dL — ABNORMAL HIGH (ref 70–99)
Glucose-Capillary: 132 mg/dL — ABNORMAL HIGH (ref 70–99)

## 2012-12-31 LAB — BASIC METABOLIC PANEL
BUN: 14 mg/dL (ref 6–23)
CO2: 29 mEq/L (ref 19–32)
Chloride: 99 mEq/L (ref 96–112)
Glucose, Bld: 127 mg/dL — ABNORMAL HIGH (ref 70–99)
Potassium: 3.1 mEq/L — ABNORMAL LOW (ref 3.5–5.1)
Sodium: 140 mEq/L (ref 135–145)

## 2012-12-31 MED ORDER — FUROSEMIDE 40 MG PO TABS: 40.0000 mg | ORAL_TABLET | Freq: Two times a day (BID) | ORAL | Status: DC

## 2012-12-31 NOTE — Discharge Summary (Addendum)
Physician Discharge Summary  [name] ZOX:[id number] DOB: Apr 27, 1918 DOA: 12/27/2012  PCP: [name], MD  Admit date: 12/27/2012 Discharge date: 01/03/2013  Time spent: 40 minutes  Recommendations for Outpatient Follow-up:  1. Follow up with PCP in 1 week. 2. b-met in 1 week. 3. Follow up with cardiology this week.   Discharge Diagnoses:  Active Problems:   Acute respiratory failure   Acute combined systolic and diastolic congestive heart failure   Diabetes   SOB (shortness of breath)   Diastolic congestive heart failure   Discharge Condition: guarded  Diet recommendation: low sodium diet  Filed Weights   12/29/12 0705 12/30/12 0542 12/31/12 0415  Weight: 79.7 kg (175 lb 11.3 oz) 80.1 kg (176 lb 9.4 oz) 80.5 kg (177 lb 7.5 oz)    History of present illness:  79 y.o. female  Who recently lost her son. Family says she has gone "downhill" since then. She normally has some SOB with exertion but they have noticed that this has worsened. X-ray in ER showed worsening pleural effusion. She has also not been eating, been increasing weakness. No chest pain, no fever, no chills, + cough.  Family has noticed some tremor and thought she might be in atrial fib again. Family states she has also been praying every night to die. She has not been able to work with PT the way she had in the past.   Hospital Course:  SOB (shortness of breath) due to acute combine systolic and Diastolic congestive heart failure:  -Started on  IV lasix, fluid restrict, and daily weight. - diuresis well. Electrolytes where repleted as needed. - metoprolol, lasix.  - family met with hospice. They will like to cont management of her chronic disease and will cont with hospice at home.  Diabetes  - well control.  - cont current regimen.      Procedures:  CXR  Consultations:  PMT  Discharge Exam: Filed Vitals:   12/31/12 1000  BP: 125/49  Pulse: 64  Temp: 97.4 F (36.3 C)  Resp: 16     General: A&O x3 Cardiovascular: RRR Respiratory: good air movement CTA B/L  Discharge Instructions      Discharge Orders   Future Appointments Provider Department Dept Phone   01/15/2013 3:00 PM [facility] [place] Office [phone]   01/20/2013 2:45 PM Micki Riley, MD [facility] [phone]   Future Orders Complete By Expires   Diet - low sodium heart healthy  As directed    Increase activity slowly  As directed        Medication List    STOP taking these medications       CALCIUM PO     furosemide 40 MG tablet  Commonly known as:  LASIX      TAKE these medications       amiodarone 200 MG tablet  Commonly known as:  PACERONE  Take 2 tablets (400 mg total) by mouth daily.     apixaban 2.5 MG Tabs tablet  Commonly known as:  ELIQUIS  Take 1 tablet (2.5 mg total) by mouth 2 (two) times daily.     atorvastatin 20 MG tablet  Commonly known as:  LIPITOR  Take 20 mg by mouth at bedtime.     levETIRAcetam 500 MG tablet  Commonly known as:  KEPPRA  Take 1 tablet (500 mg total) by mouth 2 (two) times daily.     metFORMIN 500 MG 24 hr tablet  Commonly known  as:  GLUCOPHAGE-XR  Take 1 tablet (500 mg total) by mouth 3 (three) times daily after meals.     metoprolol tartrate 25 MG tablet  Commonly known as:  LOPRESSOR  Take 25 mg by mouth 2 (two) times daily.     sertraline 25 MG tablet  Commonly known as:  ZOLOFT  Take 25 mg by mouth daily.       Allergies  Allergen Reactions  . Nitrofurantoin Nausea And Vomiting  . Protonix [Pantoprazole Sodium] Nausea Only  . Sulfa Antibiotics Hives  . Trimethoprim Nausea Only   Follow-up Information   Follow up with FULP, CAMMIE, MD On 01/06/2013. (hospital follow up @11 :00am spoke with [facility])    Specialty:  Family Medicine   Contact information:   [address] [place], [place] [address] [place] [address] [phone]        The results of significant diagnostics  from this hospitalization (including imaging, microbiology, ancillary and laboratory) are listed below for reference.    Significant Diagnostic Studies: Dg Chest 2 View  12/27/2012   CLINICAL DATA:  Shortness of breath.  EXAM: CHEST  2 VIEW  COMPARISON:  Two-view chest 11/07/2012  FINDINGS: The heart is enlarged. A left pleural effusion and basilar airspace disease has increased since the prior study. Mild pulmonary vascular congestion is evident. A small right pleural effusion is noted as well. Pacing wires are stable. Atherosclerotic calcifications are noted in the aorta. The upper lung fields are clear. Emphysematous changes are noted. Degenerative changes throughout the thoracic spine are stable.  IMPRESSION: 1. Increased left pleural effusion and basilar airspace disease. Pneumonia is not excluded. 2. Small right pleural effusion and associated atelectasis. 3. Emphysema. 4. Cardiomegaly and mild pulmonary vascular congestion without frank edema otherwise.   Electronically Signed   By: Gennette Pac M.D.   On: 12/27/2012 11:59    Microbiology: Recent Results (from the past 240 hour(s))  SURGICAL PCR SCREEN     Status: None   Collection Time    01/02/13  1:57 PM      Result Value Range Status   MRSA, PCR NEGATIVE  NEGATIVE Final   Staphylococcus aureus NEGATIVE  NEGATIVE Final   Comment:            The Xpert SA Assay (FDA     approved for NASAL specimens     in patients over 58 years of age),     is one component of     a comprehensive surveillance     program.  Test performance has     been validated by [employer] for patients greater     than or equal to 79 year old.     It is not intended     to diagnose infection nor to     guide or monitor treatment.     Labs: Basic Metabolic Panel:  Recent Labs Lab 12/28/12 0455 12/29/12 0848 12/30/12 0525 12/30/12 1152 12/31/12 0530 01/01/13 1048  NA 138 138 137 140 140 137  K 3.2* 3.3* 3.2* 3.6 3.1* 4.1  CL 97 98 99 99 99  98  CO2 25 28 28 29 29 29   GLUCOSE 91 152* 142* 142* 127* 170*  BUN 14 13 14 14 14 15   CREATININE 0.99 1.02 0.99 1.27* 1.02 1.2  CALCIUM 7.4* 7.5* 7.5* 7.9* 7.6* 7.9*  MG 0.7*  --   --   --   --   --    Liver Function Tests: No results  found for this basename: AST, ALT, ALKPHOS, BILITOT, PROT, ALBUMIN,  in the last 168 hours No results found for this basename: LIPASE, AMYLASE,  in the last 168 hours No results found for this basename: AMMONIA,  in the last 168 hours CBC:  Recent Labs Lab 01/01/13 1048  WBC 8.8  NEUTROABS 6.1  HGB 13.8  HCT 40.8  MCV 93.0  PLT 229.0   Cardiac Enzymes:  Recent Labs Lab 12/27/12 2020 12/28/12 0455  TROPONINI <0.30 <0.30   BNP: BNP (last 3 results)  Recent Labs  10/29/12 0933 10/30/12 0611 12/27/12 1132  PROBNP 4185.0* 5109.0* 3125.0*   CBG:  Recent Labs Lab 12/30/12 1155 12/30/12 1611 12/30/12 2055 12/31/12 0608 12/31/12 1129  GLUCAP 168* 227* 162* 124* 132*       Signed:  FELIZ ORTIZ, ABRAHAM  [facility] 01/03/2013, 5:05 PM

## 2012-12-31 NOTE — Progress Notes (Signed)
DC IV, DC Home. Discharge instructions and home medications discussed with patient and patient's daughter. Patient and family denied any questions or concerns at this time. Patient leaving unit via wheelchair and appears in no acute distress.

## 2013-01-01 ENCOUNTER — Telehealth: Payer: Self-pay

## 2013-01-01 ENCOUNTER — Encounter: Payer: Self-pay | Admitting: *Deleted

## 2013-01-01 ENCOUNTER — Ambulatory Visit (INDEPENDENT_AMBULATORY_CARE_PROVIDER_SITE_OTHER): Payer: Medicare Other | Admitting: Internal Medicine

## 2013-01-01 ENCOUNTER — Encounter: Payer: Self-pay | Admitting: Internal Medicine

## 2013-01-01 ENCOUNTER — Encounter (HOSPITAL_COMMUNITY): Payer: Self-pay | Admitting: Pharmacy Technician

## 2013-01-01 ENCOUNTER — Telehealth: Payer: Self-pay | Admitting: Internal Medicine

## 2013-01-01 ENCOUNTER — Encounter: Payer: Self-pay | Admitting: Interventional Cardiology

## 2013-01-01 ENCOUNTER — Ambulatory Visit: Payer: Medicare Other | Admitting: Interventional Cardiology

## 2013-01-01 ENCOUNTER — Ambulatory Visit (INDEPENDENT_AMBULATORY_CARE_PROVIDER_SITE_OTHER): Payer: Medicare Other | Admitting: *Deleted

## 2013-01-01 VITALS — BP 125/61 | HR 66 | Ht 62.0 in | Wt 189.0 lb

## 2013-01-01 DIAGNOSIS — I5032 Chronic diastolic (congestive) heart failure: Secondary | ICD-10-CM

## 2013-01-01 DIAGNOSIS — I5033 Acute on chronic diastolic (congestive) heart failure: Secondary | ICD-10-CM

## 2013-01-01 DIAGNOSIS — I4891 Unspecified atrial fibrillation: Secondary | ICD-10-CM

## 2013-01-01 DIAGNOSIS — I495 Sick sinus syndrome: Secondary | ICD-10-CM

## 2013-01-01 DIAGNOSIS — Z45018 Encounter for adjustment and management of other part of cardiac pacemaker: Secondary | ICD-10-CM

## 2013-01-01 LAB — BASIC METABOLIC PANEL
BUN: 15 mg/dL (ref 6–23)
CO2: 29 mEq/L (ref 19–32)
Chloride: 98 mEq/L (ref 96–112)
Creatinine, Ser: 1.2 mg/dL (ref 0.4–1.2)
GFR: 45.54 mL/min — ABNORMAL LOW (ref >60.00)
Glucose, Bld: 170 mg/dL — ABNORMAL HIGH (ref 70–99)
Potassium: 4.1 mEq/L (ref 3.5–5.1)
Sodium: 137 mEq/L (ref 135–145)

## 2013-01-01 LAB — CBC WITH DIFFERENTIAL/PLATELET
Basophils Relative: 0.6 % (ref 0.0–3.0)
Eosinophils Relative: 0.7 % (ref 0.0–5.0)
HCT: 40.8 % (ref 36.0–46.0)
Hemoglobin: 13.8 g/dL (ref 12.0–15.0)
Lymphs Abs: 1.9 10*3/uL (ref 0.7–4.0)
MCHC: 33.9 g/dL (ref 30.0–36.0)
MCV: 93 fl (ref 78.0–100.0)
Monocytes Absolute: 0.7 10*3/uL (ref 0.1–1.0)
Monocytes Relative: 7.6 % (ref 3.0–12.0)
Neutro Abs: 6.1 10*3/uL (ref 1.4–7.7)
Neutrophils Relative %: 69.3 % (ref 43.0–77.0)
WBC: 8.8 10*3/uL (ref 4.5–10.5)

## 2013-01-01 LAB — PACEMAKER DEVICE OBSERVATION
AL IMPEDENCE PM: 624 Ohm
AL THRESHOLD: 1 V
ATRIAL PACING PM: 41.74
RV LEAD IMPEDENCE PM: 528 Ohm
RV LEAD THRESHOLD: 1 V
VENTRICULAR PACING PM: 38.5

## 2013-01-01 MED ORDER — SODIUM CHLORIDE 0.9 % IR SOLN
80.0000 mg | Status: AC
  Filled 2013-01-01: qty 2

## 2013-01-01 MED ORDER — CEFAZOLIN SODIUM-DEXTROSE 2-3 GM-% IV SOLR: 2.0000 g | INTRAVENOUS | Status: AC

## 2013-01-01 MED ORDER — SODIUM CHLORIDE 0.9 % IV SOLN: INTRAVENOUS | Status: DC

## 2013-01-01 NOTE — Telephone Encounter (Signed)
Spoke with daughter and she is aware of how to take medications

## 2013-01-01 NOTE — Patient Instructions (Addendum)
Dr. Johney Frame has recommended that you have a pacemaker generator changed tomorrow.

## 2013-01-01 NOTE — Progress Notes (Signed)
Pacemaker check in clinic. Device at EOL since 09-22-12. Normal device function. Reverted to VVI 65 and changed back to DDDR with MVP. Pt having 10.7% of time. Longest episode was 11 days. + Eliquis. 6 nst episodes. Pt seeing JA to be set up for gen change.

## 2013-01-01 NOTE — Telephone Encounter (Signed)
Call neuro or PCP for RX, recently hospitalized

## 2013-01-01 NOTE — Patient Instructions (Addendum)
Your physician has recommended that you have a pacemaker generator changed tomorrow  Please see instruction sheet

## 2013-01-01 NOTE — Telephone Encounter (Signed)
New Problem:  Pt's daughter is calling in with questions regarding pt's procedure tomorrow. Regarding what meds to take and will pt be able to have fluids before surgery... Please advise

## 2013-01-01 NOTE — Telephone Encounter (Signed)
Keppra refill request to [facility] aid.  Please advise

## 2013-01-01 NOTE — Progress Notes (Signed)
Patient ID: [name], female   DOB: [birth year], 79 y.o.   MRN: [id number]    [address] [place], [place]  [address] Phone: [phone] Fax:  [phone]  Date:  01/01/2013   ID:  [name], DOB November 20, 1918, MRN [id number]  PCP:  [name], MD      History of Present Illness: Jarrah Seher is a 79 y.o. female with pacemaker and paroxysmal atrial fibrillation. She was hospitalized due to a stroke a couple months ago. At that time, she was found to have a prolonged QT interval by ECG. Her sotalol was stopped. It was thought that her progressive renal dysfunction may have caused an over accumulation of sotalol and her system. When she left the hospital, she was in sinus rhythm. She continues to have some exertional shortness of breath. She feels fine at rest. She notices some occasional palpitations as well. Amiodarone was started. This was increased to 400 mg twice a day to palpitations.    She was readmitted to the hospital to 2 heart failure symptoms. Her oldest son passed away and the patient made the statement that she was ready to die. Palliative care was consulted. Hospice was discussed. They wanted to come and talk to me in the office about this before making any final decisions. Today in the office, her pacemaker was checked and she was found to be at [facility]. Her pacemaker had reverted back to VVI pacing. It is thought that perhaps she has had some fluid overload due to the VVI pacing.      Wt Readings from Last 3 Encounters:  01/01/13 189 lb (85.73 kg)  12/31/12 177 lb 7.5 oz (80.5 kg)  12/03/12 189 lb 6.4 oz (85.911 kg)     Past Medical History  Diagnosis Date  . Diabetes mellitus   . Hypertension   . Hyperlipidemia   . Arthritis   . AF (atrial fibrillation)   . Cataract     decreased vision in left eye  . Peripheral vascular disease   . CHF (congestive heart failure)   . Stroke     Current Outpatient Prescriptions  Medication Sig Dispense  Refill  . amiodarone (PACERONE) 200 MG tablet Take 2 tablets (400 mg total) by mouth daily.  60 tablet  5  . apixaban (ELIQUIS) 2.5 MG TABS tablet Take 1 tablet (2.5 mg total) by mouth 2 (two) times daily.  60 tablet  1  . atorvastatin (LIPITOR) 20 MG tablet Take 20 mg by mouth at bedtime.       . furosemide (LASIX) 40 MG tablet Take 1 tablet (40 mg total) by mouth 2 (two) times daily.  60 tablet  1  . levETIRAcetam (KEPPRA) 500 MG tablet Take 1 tablet (500 mg total) by mouth 2 (two) times daily.  60 tablet  1  . metFORMIN (GLUCOPHAGE-XR) 500 MG 24 hr tablet Take 1 tablet (500 mg total) by mouth 3 (three) times daily after meals.      . metoprolol tartrate (LOPRESSOR) 25 MG tablet Take 25 mg by mouth 2 (two) times daily.      . potassium chloride SA (K-DUR,KLOR-CON) 20 MEQ tablet Take 20 mEq by mouth 2 (two) times daily.      . sertraline (ZOLOFT) 25 MG tablet Take 25 mg by mouth daily.       No current facility-administered medications for this visit.   Facility-Administered Medications Ordered in Other Visits  Medication Dose Route Frequency Provider Last Rate Last Dose  .  0.9 %  sodium chloride infusion   Intravenous Continuous Hillis Range, MD      . ceFAZolin (ANCEF) IVPB 2 g/50 mL premix  2 g Intravenous On Call Hillis Range, MD      . gentamicin (GARAMYCIN) 80 mg in sodium chloride irrigation 0.9 % 500 mL irrigation  80 mg Irrigation On Call Dennie Fetters, [facility]        Allergies:    Allergies  Allergen Reactions  . Nitrofurantoin Nausea And Vomiting  . Protonix [Pantoprazole Sodium] Nausea Only  . Sulfa Antibiotics Hives  . Trimethoprim Nausea Only    Social History:  The patient  reports that she has never smoked. She has never used smokeless tobacco. She reports that she does not drink alcohol or use illicit drugs.   Family History:  The patient's Family history is unknown by patient.   ROS:  Please see the history of present illness.  No nausea, vomiting.  No fevers,  chills.  No focal weakness.  No dysuria. Generalized weakness, depression since the death of her son.   All other systems reviewed and negative.   PHYSICAL EXAM: VS:  There were no vitals taken for this visit. Well nourished, well developed, in no acute distress HEENT: normal Neck: no JVD, no carotid bruits Cardiac:  normal S1, S2; RRR;  Lungs:  clear to auscultation bilaterally, no wheezing, rhonchi or rales Abd: soft, nontender, no hepatomegaly Ext: Trace bilateral lower extremity edema Skin: warm and dry Neuro:   no focal abnormalities noted     ASSESSMENT AND PLAN:  1. Atrial fibrillation with recent stroke: Continue anticoagulation with Eliquis. 2. Chronic diastolic heart failure: This is likely age-related.  Continue diuretics. Try the use amiodarone to maintain normal sinus rhythm. We spoke at length regarding her diagnosis of heart failure and the need for palliative care versus hospice. Hopefully, reprogramming her pacemaker to dual-chamber pacing will help reduce some of her heart failure symptoms. I did set realistic expectations that he may not make a huge difference. The daughter who is visiting from [place] is here and they will consider palliative care services so that she'll have someone visiting her in the home.  I would be willing to sign off on hospice papers for her if she is so inclined.  Signed, Fredric Mare, MD, [facility] 01/01/2013 5:18 PM

## 2013-01-02 ENCOUNTER — Other Ambulatory Visit: Payer: Self-pay

## 2013-01-02 ENCOUNTER — Ambulatory Visit (HOSPITAL_COMMUNITY)
Admission: RE | Admit: 2013-01-02 | Discharge: 2013-01-02 | Disposition: A | Discharge status: Home / Self Care | Payer: Medicare Other | Source: Ambulatory Visit | Attending: Internal Medicine | Admitting: Internal Medicine

## 2013-01-02 ENCOUNTER — Encounter: Payer: Self-pay | Admitting: Internal Medicine

## 2013-01-02 ENCOUNTER — Encounter (HOSPITAL_COMMUNITY)
Admission: RE | Disposition: A | Discharge status: Home / Self Care | Payer: Self-pay | Source: Ambulatory Visit | Attending: Internal Medicine

## 2013-01-02 ENCOUNTER — Telehealth: Payer: Self-pay | Admitting: Internal Medicine

## 2013-01-02 DIAGNOSIS — Z79899 Other long term (current) drug therapy: Secondary | ICD-10-CM | POA: Insufficient documentation

## 2013-01-02 DIAGNOSIS — I495 Sick sinus syndrome: Secondary | ICD-10-CM | POA: Insufficient documentation

## 2013-01-02 DIAGNOSIS — I739 Peripheral vascular disease, unspecified: Secondary | ICD-10-CM | POA: Insufficient documentation

## 2013-01-02 DIAGNOSIS — Z7901 Long term (current) use of anticoagulants: Secondary | ICD-10-CM | POA: Insufficient documentation

## 2013-01-02 DIAGNOSIS — I4891 Unspecified atrial fibrillation: Secondary | ICD-10-CM | POA: Insufficient documentation

## 2013-01-02 DIAGNOSIS — H269 Unspecified cataract: Secondary | ICD-10-CM | POA: Insufficient documentation

## 2013-01-02 DIAGNOSIS — E119 Type 2 diabetes mellitus without complications: Secondary | ICD-10-CM | POA: Insufficient documentation

## 2013-01-02 DIAGNOSIS — Z45018 Encounter for adjustment and management of other part of cardiac pacemaker: Secondary | ICD-10-CM | POA: Insufficient documentation

## 2013-01-02 DIAGNOSIS — Z8673 Personal history of transient ischemic attack (TIA), and cerebral infarction without residual deficits: Secondary | ICD-10-CM | POA: Insufficient documentation

## 2013-01-02 DIAGNOSIS — I10 Essential (primary) hypertension: Secondary | ICD-10-CM | POA: Insufficient documentation

## 2013-01-02 DIAGNOSIS — I5032 Chronic diastolic (congestive) heart failure: Secondary | ICD-10-CM | POA: Insufficient documentation

## 2013-01-02 DIAGNOSIS — I509 Heart failure, unspecified: Secondary | ICD-10-CM | POA: Insufficient documentation

## 2013-01-02 DIAGNOSIS — I5033 Acute on chronic diastolic (congestive) heart failure: Secondary | ICD-10-CM | POA: Insufficient documentation

## 2013-01-02 HISTORY — PX: PERMANENT PACEMAKER GENERATOR CHANGE: SHX6022

## 2013-01-02 LAB — SURGICAL PCR SCREEN: Staphylococcus aureus: NEGATIVE

## 2013-01-02 SURGERY — PERMANENT PACEMAKER GENERATOR CHANGE
Anesthesia: LOCAL

## 2013-01-02 MED ORDER — MUPIROCIN 2 % EX OINT
TOPICAL_OINTMENT | Freq: Two times a day (BID) | CUTANEOUS | Status: DC
  Filled 2013-01-02: qty 22

## 2013-01-02 MED ORDER — LIDOCAINE HCL (PF) 1 % IJ SOLN
INTRAMUSCULAR | Status: AC
  Filled 2013-01-02: qty 60

## 2013-01-02 MED ORDER — SODIUM CHLORIDE 0.9 % IV SOLN: 250.0000 mL | INTRAVENOUS | Status: DC | PRN

## 2013-01-02 MED ORDER — SODIUM CHLORIDE 0.9 % IJ SOLN: 3.0000 mL | INTRAMUSCULAR | Status: DC | PRN

## 2013-01-02 MED ORDER — ACETAMINOPHEN 325 MG PO TABS: 325.0000 mg | ORAL_TABLET | ORAL | Status: DC | PRN

## 2013-01-02 MED ORDER — HYDROCODONE-ACETAMINOPHEN 5-325 MG PO TABS: 1.0000 | ORAL_TABLET | ORAL | Status: DC | PRN

## 2013-01-02 MED ORDER — CHLORHEXIDINE GLUCONATE 4 % EX LIQD
60.0000 mL | Freq: Once | CUTANEOUS | Status: DC
  Filled 2013-01-02: qty 60

## 2013-01-02 MED ORDER — ONDANSETRON HCL 4 MG/2ML IJ SOLN: 4.0000 mg | Freq: Four times a day (QID) | INTRAMUSCULAR | Status: DC | PRN

## 2013-01-02 MED ORDER — SODIUM CHLORIDE 0.9 % IJ SOLN: 3.0000 mL | Freq: Two times a day (BID) | INTRAMUSCULAR | Status: DC

## 2013-01-02 MED ORDER — CEFAZOLIN SODIUM 1-5 GM-% IV SOLN
INTRAVENOUS | Status: AC
  Filled 2013-01-02: qty 100

## 2013-01-02 MED ORDER — MUPIROCIN 2 % EX OINT
TOPICAL_OINTMENT | CUTANEOUS | Status: AC
  Filled 2013-01-02: qty 22

## 2013-01-02 NOTE — Telephone Encounter (Signed)
New message    Having pacer battery changeout today at 1.  Pt took eliquis last night.  Can she still have her pacer procedure?

## 2013-01-02 NOTE — Progress Notes (Signed)
  FULP, CAMMIE, MD is PCP Primary Cardiologist:  Dr Varanasi  [name] is a 79 y.o. female with a h/o afib and tachycardia/ bradycardia syndrome with pauses of > 4seconds sp PPM (MDT) by Dr Edmunds who presents today to establish care in the Electrophysiology device clinic.   The patient reports doing very well since having a pacemaker implanted.  She was hospitalized due to a stroke a couple months ago. At that time, she was found to have a prolonged QT interval by ECG. Her sotalol was stopped. It was thought that her progressive renal dysfunction may have caused an over accumulation of sotalol and her system.  She was placed on amiodarone. She was readmitted to the hospital to 2 heart failure symptoms. Her oldest son passed away and the patient made the statement that she was ready to die. Palliative care was consulted. Hospice was discussed.  She presents to establish in the [facility] device clinic.  Her pacemaker was checked and she is found to be at [facility]. Her pacemaker had reverted back to VVI pacing. It is thought that perhaps she has had some fluid overload due to the VVI pacing.     Past Medical History  Diagnosis Date  . Diabetes mellitus   . Hypertension   . Hyperlipidemia   . Arthritis   . AF (atrial fibrillation)   . Cataract     decreased vision in left eye  . Peripheral vascular disease   . CHF (congestive heart failure)   . Stroke   . Tachycardia-bradycardia syndrome    Past Surgical History  Procedure Laterality Date  . Pacemaker insertion  08/08/2006    MDT PPM implanted by Dr Edmunds for tachybrady syndrome and pauses with afib  . Cholecystectomy    . Thyroid surgery      History   Social History  . Marital Status: Married    Spouse Name: N/A    Number of Children: N/A  . Years of Education: N/A   Occupational History  . Not on file.   Social History Main Topics  . Smoking status: Never Smoker   . Smokeless tobacco: Never Used  . Alcohol Use: No  .  Drug Use: No  . Sexual Activity: Not on file   Other Topics Concern  . Not on file   Social History Narrative  . No narrative on file    No family history on file.  Allergies  Allergen Reactions  . Nitrofurantoin Nausea And Vomiting  . Protonix [Pantoprazole Sodium] Nausea Only  . Sulfa Antibiotics Hives  . Trimethoprim Nausea Only    No current facility-administered medications for this visit.   No current outpatient prescriptions on file.   Facility-Administered Medications Ordered in Other Visits  Medication Dose Route Frequency Provider Last Rate Last Dose  . 0.9 %  sodium chloride infusion   Intravenous Continuous Ural Acree, MD      . chlorhexidine (HIBICLENS) 4 % liquid 4 application  60 mL Topical Once Artasia Thang, MD      . mupirocin ointment (BACTROBAN) 2 %   Nasal BID Aboubacar Matsuo, MD      . mupirocin ointment (BACTROBAN) 2 %             ROS- all systems are reviewed and negative except as per HPI  Physical Exam: Filed Vitals:   01/01/13 0942  BP: 125/61  Pulse: 66  Height: 5' 2" (1.575 m)  Weight: 189 lb (85.73 kg)    GEN- The   patient is elderly appearing, alert  Head- normocephalic, atraumatic Eyes-  Sclera clear, conjunctiva pink Ears- hearing intact Oropharynx- clear Neck- supple,  Lungs- Clear to ausculation bilaterally, normal work of breathing Chest- pacemaker pocket is well healed Heart- Regular rate and rhythm  GI- soft, NT, ND, + BS Extremities- no clubbing, cyanosis, + edema MS- age appropriate muscle atrophy Skin- no rash or lesion Psych- euthymic mood, full affect Neuro- strength and sensation are intact  Pacemaker interrogation- reviewed in detail today,  See PACEART report  Assessment and Plan:  1. Tachycardia/ bradycardia syndrome PPM is now at [facility] battery status. Risks, benefits, and alternatives to PPM pulse generator replacement were discussed at length with the patient and her family who wish to proceed. We will  proceed with the procedure at the next available time.  2. Persistent afib Continue amiodarone for now She is on eliquis for stroke prevention  3. Acute on chronic diastolic dysfunction Possible worsened by VVI pacing reversion.  Will proceed with pacemaker generator change at the next available time. 

## 2013-01-02 NOTE — Op Note (Signed)
SURGEON:  Hillis Range, MD     PREPROCEDURE DIAGNOSES:   1. Sick sinus syndrome/ symptomatic sinus bradycardia.   2. Paroxsymal Atrial fibrillation.     POSTPROCEDURE DIAGNOSES:   1. Sick sinus syndrome, symptomatic sinus bradycardia.  2. Paroxsymal Atrial fibrillation.     PROCEDURES:   1. Pacemaker pulse generator replacement.   2. Skin pocket revision.     INTRODUCTION:  [name] is a 79 y.o. female with a history of sick sinus syndrome.  She has a h/o tachycardia/ bradycardia syndrome. She has done well since his pacemaker was implanted.  She has recently reached [facility] battery status.  She presents today for pacemaker pulse generator replacement.       DESCRIPTION OF THE PROCEDURE:  Informed written consent was obtained, and the patient was brought to the electrophysiology lab in the fasting state.  The patient's pacemaker was interrogated today and found to be at elective replacement indicator battery status.  The patient required no sedation for the procedure today.  The patient's left chest was prepped and draped in the usual sterile fashion by the EP lab staff.  The skin overlying the existing pacemaker was infiltrated with lidocaine for local analgesia.  There was mild retraction of the pocket but no obvious infection.  A 4-cm incision was made over the pacemaker pocket.  Using a combination of sharp and blunt dissection, the pacemaker was exposed and removed from the body.  The device was disconnected from the leads.  There was no foreign matter or debris within the pocket.  The atrial lead was confirmed to be a Medtronic model [id number] (serial number [id number]) lead implanted on 08/08/2006.  The right ventricular lead was confirmed to be a Medtronic model [id number] (serial number [id number]) lead implanted on the same date as the atrial lead (above).  Both leads were examined and their integrity was confirmed to be intact.  Atrial lead P-waves measured 1.5 mV with impedance of 584 ohms and a  threshold of 0.4 V at 0.5 msec.  Right ventricular lead R-waves measured 12.6 mV with impedance of 566 ohms and a threshold of 0.6 V at 0.5 msec.  Both leads were connected to a Medtronic [place] model [id number] (serial number [id number]) pacemaker.  The pocket was revised to accommodate this new device.  Electrocautery was required to assure hemostasis.  The pocket was irrigated with copious gentamicin solution. The pacemaker was then placed into the pocket.  The pocket was then closed in 2 layers with 2-0 Vicryl suture over the subcutaneous and subcuticular layers.  Steri-Strips and a sterile dressing were then applied.  There were no early apparent complications.     CONCLUSIONS:   1. Successful pacemaker pulse generator replacement for elective replacement indicator battery status   2. No early apparent complications.     Hillis Range, MD 01/02/2013 4:21 PM

## 2013-01-02 NOTE — Progress Notes (Signed)
Voided 200cc of amber colored  Urine.

## 2013-01-02 NOTE — Interval H&P Note (Signed)
History and Physical Interval Note:  01/02/2013 3:49 PM  [name]  has presented today for surgery, with the diagnosis of eri  The various methods of treatment have been discussed with the patient and family. After consideration of risks, benefits and other options for treatment, the patient has consented to  Procedure(s): PERMANENT PACEMAKER GENERATOR CHANGE (N/A) as a surgical intervention .  The patient's history has been reviewed, patient examined, no change in status, stable for surgery.  I have reviewed the patient's chart and labs.  Questions were answered to the patient's satisfaction.     Hillis Range

## 2013-01-02 NOTE — Telephone Encounter (Signed)
I spoke with [name] in the EP lab who talked with Dr. Dory Horn about pt taking Eliquis last pm. Per Dr. Johney Frame that is okay Pt daughter, [name], is aware [name] RN

## 2013-01-02 NOTE — Telephone Encounter (Signed)
Message sent to Dr. Allred.

## 2013-01-02 NOTE — Progress Notes (Signed)
Attempted PIV access.  Pt began yelling and screaming.  Jerking arm and telling me to take my hands off of her and take the PIV out.  Dtr at bedside states pt is A and O x4 , has hx of difficult PIV starts.  Pt tearful and refusing tx at this time.  Also states she had water to drink around 12 today, but unable to tell how much.. Dtr states pt knew to be NPO.  States may be a mouthful of H2O.-  Will notify RN .

## 2013-01-02 NOTE — H&P (View-Only) (Signed)
[name], CAMMIE, MD is PCP Primary Cardiologist:  Dr Luanna Salk is a 79 y.o. female with a h/o afib and tachycardia/ bradycardia syndrome with pauses of > 4seconds sp PPM (MDT) by Dr Amil Amen who presents today to establish care in the Electrophysiology device clinic.   The patient reports doing very well since having a pacemaker implanted.  She was hospitalized due to a stroke a couple months ago. At that time, she was found to have a prolonged QT interval by ECG. Her sotalol was stopped. It was thought that her progressive renal dysfunction may have caused an over accumulation of sotalol and her system.  She was placed on amiodarone. She was readmitted to the hospital to 2 heart failure symptoms. Her oldest son passed away and the patient made the statement that she was ready to die. Palliative care was consulted. Hospice was discussed.  She presents to establish in the [facility] device clinic.  Her pacemaker was checked and she is found to be at [facility]. Her pacemaker had reverted back to VVI pacing. It is thought that perhaps she has had some fluid overload due to the VVI pacing.     Past Medical History  Diagnosis Date  . Diabetes mellitus   . Hypertension   . Hyperlipidemia   . Arthritis   . AF (atrial fibrillation)   . Cataract     decreased vision in left eye  . Peripheral vascular disease   . CHF (congestive heart failure)   . Stroke   . Tachycardia-bradycardia syndrome    Past Surgical History  Procedure Laterality Date  . Pacemaker insertion  08/08/2006    MDT PPM implanted by Dr Amil Amen for tachybrady syndrome and pauses with afib  . Cholecystectomy    . Thyroid surgery      History   Social History  . Marital Status: Married    Spouse Name: N/A    Number of Children: N/A  . Years of Education: N/A   Occupational History  . Not on file.   Social History Main Topics  . Smoking status: Never Smoker   . Smokeless tobacco: Never Used  . Alcohol Use: No  .  Drug Use: No  . Sexual Activity: Not on file   Other Topics Concern  . Not on file   Social History Narrative  . No narrative on file    No family history on file.  Allergies  Allergen Reactions  . Nitrofurantoin Nausea And Vomiting  . Protonix [Pantoprazole Sodium] Nausea Only  . Sulfa Antibiotics Hives  . Trimethoprim Nausea Only    No current facility-administered medications for this visit.   No current outpatient prescriptions on file.   Facility-Administered Medications Ordered in Other Visits  Medication Dose Route Frequency Provider Last Rate Last Dose  . 0.9 %  sodium chloride infusion   Intravenous Continuous Hillis Range, MD      . chlorhexidine (HIBICLENS) 4 % liquid 4 application  60 mL Topical Once Hillis Range, MD      . mupirocin ointment (BACTROBAN) 2 %   Nasal BID Hillis Range, MD      . mupirocin ointment (BACTROBAN) 2 %             ROS- all systems are reviewed and negative except as per HPI  Physical Exam: Filed Vitals:   01/01/13 0942  BP: 125/61  Pulse: 66  Height: 5\' 2"  (1.575 m)  Weight: 189 lb (85.73 kg)    GEN- The  patient is elderly appearing, alert  Head- normocephalic, atraumatic Eyes-  Sclera clear, conjunctiva pink Ears- hearing intact Oropharynx- clear Neck- supple,  Lungs- Clear to ausculation bilaterally, normal work of breathing Chest- pacemaker pocket is well healed Heart- Regular rate and rhythm  GI- soft, NT, ND, + BS Extremities- no clubbing, cyanosis, + edema MS- age appropriate muscle atrophy Skin- no rash or lesion Psych- euthymic mood, full affect Neuro- strength and sensation are intact  Pacemaker interrogation- reviewed in detail today,  See PACEART report  Assessment and Plan:  1. Tachycardia/ bradycardia syndrome PPM is now at [facility] battery status. Risks, benefits, and alternatives to PPM pulse generator replacement were discussed at length with the patient and her family who wish to proceed. We will  proceed with the procedure at the next available time.  2. Persistent afib Continue amiodarone for now She is on eliquis for stroke prevention  3. Acute on chronic diastolic dysfunction Possible worsened by VVI pacing reversion.  Will proceed with pacemaker generator change at the next available time.

## 2013-01-05 ENCOUNTER — Encounter (HOSPITAL_COMMUNITY): Payer: Self-pay | Admitting: *Deleted

## 2013-01-06 ENCOUNTER — Telehealth: Payer: Self-pay

## 2013-01-06 ENCOUNTER — Ambulatory Visit: Payer: Medicare Other | Admitting: Interventional Cardiology

## 2013-01-06 NOTE — Telephone Encounter (Signed)
Weston Brass a physical therapist with [facility] called to let us know that he attempted to evaluate patient but she was unable to participate due to her not feeling well.

## 2013-01-06 NOTE — Telephone Encounter (Signed)
May refill Keppra 500mg  BID #30 no refills

## 2013-01-06 NOTE — Telephone Encounter (Signed)
Patient's daughter called stated that patient will be out of Keppra 500 mg this week prescribed by Marissa Nestle. Patient has an appt with the neurologist on 11/24. She wants to know if we can refill the med and then when the patient sees the neurologist they can take over treatment. Please advise.

## 2013-01-07 ENCOUNTER — Other Ambulatory Visit: Payer: Self-pay

## 2013-01-07 MED ORDER — LEVETIRACETAM 500 MG PO TABS: 500.0000 mg | ORAL_TABLET | Freq: Two times a day (BID) | ORAL | Status: DC

## 2013-01-07 NOTE — Telephone Encounter (Signed)
Keppra refill sent to pharmacy

## 2013-01-07 NOTE — Telephone Encounter (Signed)
Patient is aware that refill is at pharmacy.

## 2013-01-09 ENCOUNTER — Inpatient Hospital Stay (HOSPITAL_COMMUNITY)
Admission: EM | Admit: 2013-01-09 | Discharge: 2013-01-14 | DRG: 100 | Disposition: A | Discharge status: Skilled Nursing Facility | Payer: Medicare Other | Attending: Family Medicine | Admitting: Family Medicine

## 2013-01-09 ENCOUNTER — Encounter (HOSPITAL_COMMUNITY): Payer: Self-pay | Admitting: Emergency Medicine

## 2013-01-09 ENCOUNTER — Telehealth: Payer: Self-pay | Admitting: Physician Assistant

## 2013-01-09 ENCOUNTER — Emergency Department (HOSPITAL_COMMUNITY): Payer: Medicare Other

## 2013-01-09 DIAGNOSIS — R0602 Shortness of breath: Secondary | ICD-10-CM

## 2013-01-09 DIAGNOSIS — B952 Enterococcus as the cause of diseases classified elsewhere: Secondary | ICD-10-CM

## 2013-01-09 DIAGNOSIS — R5381 Other malaise: Secondary | ICD-10-CM | POA: Diagnosis present

## 2013-01-09 DIAGNOSIS — I70269 Atherosclerosis of native arteries of extremities with gangrene, unspecified extremity: Secondary | ICD-10-CM

## 2013-01-09 DIAGNOSIS — E119 Type 2 diabetes mellitus without complications: Secondary | ICD-10-CM | POA: Diagnosis present

## 2013-01-09 DIAGNOSIS — I4891 Unspecified atrial fibrillation: Secondary | ICD-10-CM

## 2013-01-09 DIAGNOSIS — Z515 Encounter for palliative care: Secondary | ICD-10-CM

## 2013-01-09 DIAGNOSIS — G40401 Other generalized epilepsy and epileptic syndromes, not intractable, with status epilepticus: Principal | ICD-10-CM | POA: Diagnosis present

## 2013-01-09 DIAGNOSIS — Z66 Do not resuscitate: Secondary | ICD-10-CM | POA: Diagnosis present

## 2013-01-09 DIAGNOSIS — I6340 Cerebral infarction due to embolism of unspecified cerebral artery: Secondary | ICD-10-CM

## 2013-01-09 DIAGNOSIS — M7989 Other specified soft tissue disorders: Secondary | ICD-10-CM

## 2013-01-09 DIAGNOSIS — E871 Hypo-osmolality and hyponatremia: Secondary | ICD-10-CM

## 2013-01-09 DIAGNOSIS — Z79899 Other long term (current) drug therapy: Secondary | ICD-10-CM

## 2013-01-09 DIAGNOSIS — I10 Essential (primary) hypertension: Secondary | ICD-10-CM | POA: Diagnosis present

## 2013-01-09 DIAGNOSIS — I5041 Acute combined systolic (congestive) and diastolic (congestive) heart failure: Secondary | ICD-10-CM

## 2013-01-09 DIAGNOSIS — I5033 Acute on chronic diastolic (congestive) heart failure: Secondary | ICD-10-CM

## 2013-01-09 DIAGNOSIS — G40901 Epilepsy, unspecified, not intractable, with status epilepticus: Secondary | ICD-10-CM

## 2013-01-09 DIAGNOSIS — H9190 Unspecified hearing loss, unspecified ear: Secondary | ICD-10-CM | POA: Diagnosis present

## 2013-01-09 DIAGNOSIS — T82111A Breakdown (mechanical) of cardiac pulse generator (battery), initial encounter: Secondary | ICD-10-CM

## 2013-01-09 DIAGNOSIS — E876 Hypokalemia: Secondary | ICD-10-CM | POA: Diagnosis not present

## 2013-01-09 DIAGNOSIS — Z8673 Personal history of transient ischemic attack (TIA), and cerebral infarction without residual deficits: Secondary | ICD-10-CM

## 2013-01-09 DIAGNOSIS — R569 Unspecified convulsions: Secondary | ICD-10-CM

## 2013-01-09 DIAGNOSIS — J9600 Acute respiratory failure, unspecified whether with hypoxia or hypercapnia: Secondary | ICD-10-CM

## 2013-01-09 DIAGNOSIS — I739 Peripheral vascular disease, unspecified: Secondary | ICD-10-CM | POA: Diagnosis present

## 2013-01-09 DIAGNOSIS — E785 Hyperlipidemia, unspecified: Secondary | ICD-10-CM

## 2013-01-09 DIAGNOSIS — I5032 Chronic diastolic (congestive) heart failure: Secondary | ICD-10-CM

## 2013-01-09 DIAGNOSIS — I495 Sick sinus syndrome: Secondary | ICD-10-CM | POA: Diagnosis present

## 2013-01-09 DIAGNOSIS — M79609 Pain in unspecified limb: Secondary | ICD-10-CM

## 2013-01-09 DIAGNOSIS — I509 Heart failure, unspecified: Secondary | ICD-10-CM | POA: Diagnosis present

## 2013-01-09 DIAGNOSIS — I5030 Unspecified diastolic (congestive) heart failure: Secondary | ICD-10-CM

## 2013-01-09 LAB — CBC WITH DIFFERENTIAL/PLATELET
Basophils Absolute: 0.1 10*3/uL (ref 0.0–0.1)
Eosinophils Absolute: 0.2 10*3/uL (ref 0.0–0.7)
Eosinophils Relative: 2 % (ref 0–5)
Hemoglobin: 13.7 g/dL (ref 12.0–15.0)
Lymphs Abs: 4.7 10*3/uL — ABNORMAL HIGH (ref 0.7–4.0)
MCH: 32.1 pg (ref 26.0–34.0)
MCV: 95.6 fL (ref 78.0–100.0)
Monocytes Relative: 11 % (ref 3–12)
RBC: 4.27 MIL/uL (ref 3.87–5.11)
RDW: 15.3 % (ref 11.5–15.5)

## 2013-01-09 LAB — COMPREHENSIVE METABOLIC PANEL
AST: 32 U/L (ref 0–37)
Albumin: 3 g/dL — ABNORMAL LOW (ref 3.5–5.2)
Alkaline Phosphatase: 78 U/L (ref 39–117)
BUN: 14 mg/dL (ref 6–23)
Calcium: 7.9 mg/dL — ABNORMAL LOW (ref 8.4–10.5)
GFR calc Af Amer: 61 mL/min — ABNORMAL LOW (ref >90)
Glucose, Bld: 168 mg/dL — ABNORMAL HIGH (ref 70–99)
Total Protein: 6.2 g/dL (ref 6.0–8.3)

## 2013-01-09 LAB — URINALYSIS, ROUTINE W REFLEX MICROSCOPIC
Ketones, ur: NEGATIVE mg/dL
Leukocytes, UA: NEGATIVE
Nitrite: NEGATIVE
Specific Gravity, Urine: 1.01 (ref 1.005–1.030)
pH: 5 (ref 5.0–8.0)

## 2013-01-09 LAB — POCT I-STAT, CHEM 8
BUN: 14 mg/dL (ref 6–23)
Calcium, Ion: 1.01 mmol/L — ABNORMAL LOW (ref 1.13–1.30)
Chloride: 96 mEq/L (ref 96–112)
Potassium: 2.7 mEq/L — CL (ref 3.5–5.1)
Sodium: 140 mEq/L (ref 135–145)
TCO2: 29 mmol/L (ref 0–100)

## 2013-01-09 LAB — POCT I-STAT TROPONIN I: Troponin i, poc: 0.01 ng/mL (ref 0.00–0.08)

## 2013-01-09 MED ORDER — LORAZEPAM 2 MG/ML IJ SOLN
2.0000 mg | Freq: Once | INTRAMUSCULAR | Status: AC
  Administered 2013-01-09: 2 mg via INTRAVENOUS
  Filled 2013-01-09: qty 1

## 2013-01-09 MED ORDER — POTASSIUM CHLORIDE CRYS ER 20 MEQ PO TBCR
40.0000 meq | EXTENDED_RELEASE_TABLET | Freq: Once | ORAL | Status: AC
  Administered 2013-01-09: 40 meq via ORAL
  Filled 2013-01-09: qty 2

## 2013-01-09 MED ORDER — SERTRALINE HCL 25 MG PO TABS
25.0000 mg | ORAL_TABLET | Freq: Every day | ORAL | Status: DC
  Administered 2013-01-10 – 2013-01-14 (×5): 25 mg via ORAL
  Filled 2013-01-09 (×5): qty 1

## 2013-01-09 MED ORDER — ATORVASTATIN CALCIUM 20 MG PO TABS
20.0000 mg | ORAL_TABLET | Freq: Every day | ORAL | Status: DC
  Administered 2013-01-09 – 2013-01-13 (×5): 20 mg via ORAL
  Filled 2013-01-09 (×6): qty 1

## 2013-01-09 MED ORDER — LORAZEPAM 2 MG/ML IJ SOLN
INTRAMUSCULAR | Status: AC
  Filled 2013-01-09: qty 1

## 2013-01-09 MED ORDER — FUROSEMIDE 40 MG PO TABS
40.0000 mg | ORAL_TABLET | Freq: Two times a day (BID) | ORAL | Status: DC
  Administered 2013-01-10 – 2013-01-14 (×10): 40 mg via ORAL
  Filled 2013-01-09 (×12): qty 1

## 2013-01-09 MED ORDER — METFORMIN HCL ER 500 MG PO TB24
500.0000 mg | ORAL_TABLET | Freq: Three times a day (TID) | ORAL | Status: DC
  Administered 2013-01-10 – 2013-01-12 (×7): 500 mg via ORAL
  Filled 2013-01-09 (×11): qty 1

## 2013-01-09 MED ORDER — SODIUM CHLORIDE 0.9 % IJ SOLN
3.0000 mL | Freq: Two times a day (BID) | INTRAMUSCULAR | Status: DC
  Administered 2013-01-09 – 2013-01-14 (×7): 3 mL via INTRAVENOUS

## 2013-01-09 MED ORDER — LEVETIRACETAM 750 MG PO TABS
750.0000 mg | ORAL_TABLET | Freq: Two times a day (BID) | ORAL | Status: DC
  Administered 2013-01-09 – 2013-01-11 (×5): 750 mg via ORAL
  Filled 2013-01-09 (×5): qty 1

## 2013-01-09 MED ORDER — AMIODARONE HCL 200 MG PO TABS
400.0000 mg | ORAL_TABLET | Freq: Every day | ORAL | Status: DC
  Administered 2013-01-10 – 2013-01-14 (×5): 400 mg via ORAL
  Filled 2013-01-09 (×5): qty 2

## 2013-01-09 MED ORDER — METOPROLOL TARTRATE 25 MG PO TABS
25.0000 mg | ORAL_TABLET | Freq: Two times a day (BID) | ORAL | Status: DC
  Administered 2013-01-09 – 2013-01-14 (×10): 25 mg via ORAL
  Filled 2013-01-09 (×11): qty 1

## 2013-01-09 MED ORDER — HEPARIN SODIUM (PORCINE) 5000 UNIT/ML IJ SOLN: 5000.0000 [IU] | Freq: Three times a day (TID) | INTRAMUSCULAR | Status: DC

## 2013-01-09 MED ORDER — POTASSIUM CHLORIDE 10 MEQ/100ML IV SOLN
10.0000 meq | Freq: Once | INTRAVENOUS | Status: AC
  Administered 2013-01-09: 10 meq via INTRAVENOUS
  Filled 2013-01-09: qty 100

## 2013-01-09 MED ORDER — POTASSIUM CHLORIDE CRYS ER 20 MEQ PO TBCR
20.0000 meq | EXTENDED_RELEASE_TABLET | Freq: Two times a day (BID) | ORAL | Status: DC
  Administered 2013-01-10 – 2013-01-14 (×9): 20 meq via ORAL
  Filled 2013-01-09 (×10): qty 1

## 2013-01-09 MED ORDER — SODIUM CHLORIDE 0.9 % IV SOLN
1000.0000 mg | INTRAVENOUS | Status: AC
  Administered 2013-01-09: 1000 mg via INTRAVENOUS
  Filled 2013-01-09: qty 10

## 2013-01-09 MED ORDER — SODIUM CHLORIDE 0.9 % IV SOLN: 750.0000 mg | Freq: Two times a day (BID) | INTRAVENOUS | Status: DC

## 2013-01-09 MED ORDER — APIXABAN 2.5 MG PO TABS
2.5000 mg | ORAL_TABLET | Freq: Two times a day (BID) | ORAL | Status: DC
Start: 2013-01-09 — End: 2013-01-14
  Administered 2013-01-09 – 2013-01-14 (×10): 2.5 mg via ORAL
  Filled 2013-01-09 (×12): qty 1

## 2013-01-09 NOTE — Code Documentation (Addendum)
Patient with family today. Had a "good day".  At 1730 she was at her baseline. Then shortly afterward she told her daughter that she saw bright colored lights, staring off to left and had "shaking" of her left arm and leg.  Per family she had a stroke August 30th and her pacemaker replaced this past Thursday.  Per EMS her left arm and leg were flaccid during transport. Upon arrival in CT, noted her arms to be purple, O2 sat checked, 65% on 2L, placed on 100% NRB O2 sats 100%. Labs drawn, head CT done. NIHSS:  10, Left arm 3, Left leg 4, Right leg 2. Decreased Sensory on Left Pt began having seizure activity of left arm and leg.  2mg  Ativan given IV and 1 gm Kepra IV. Son and Daughter at bedside. Dr Amada Jupiter updated family on patient status.

## 2013-01-09 NOTE — Telephone Encounter (Signed)
Daughter called because pt had a seizure and was being transported by EMS. She wanted Dr. Eldridge Dace to know.

## 2013-01-09 NOTE — ED Notes (Addendum)
Pt. Was going to have dinner and family noticed pt. Was having shaking to her lt. Arm and lt. Leg.  Pt. Is alert oriented to self. Pt. Arrived with both hands and arms were purple.  O2 sats were 67 % on arrival .   We placed pt. On 100% of O2, sats increased to 96%.  Pt. Was verbalizing upon arrival.  She stated, "I don't have my hearing aids."    Pt. Was transferred directly to  CT scan.   Labs were drawn at Ct scan

## 2013-01-09 NOTE — ED Provider Notes (Signed)
CSN: [id number]     Arrival date & time 01/09/13  1842 History   First MD Initiated Contact with Patient 01/09/13 1845     Chief Complaint  Patient presents with  . Code Stroke   (Consider location/radiation/quality/duration/timing/severity/associated sxs/prior Treatment) HPI History limited by critical acuity of code stroke.   Pt was at dinner with family when they noticed shaking, weakness to left arm and leg. EMS were called and they stated pt with peripheral cyanosis so administered oxygen. This event occurred suddenly. Symptoms have been constant. Described as being severe. Pt with h/o stroke.  Past Medical History  Diagnosis Date  . Diabetes mellitus   . Hypertension   . Hyperlipidemia   . Arthritis   . AF (atrial fibrillation)   . Cataract     decreased vision in left eye  . Peripheral vascular disease   . CHF (congestive heart failure)   . Stroke   . Tachycardia-bradycardia syndrome    Past Surgical History  Procedure Laterality Date  . Pacemaker insertion  08/08/2006; 12/2012    MDT PPM implanted by Dr Amil Amen for tachybrady syndrome and pauses with afib; generator change by Dr Johney Frame 12/2012 - MDT [id number]  . Cholecystectomy    . Thyroid surgery     No family history on file. History  Substance Use Topics  . Smoking status: Never Smoker   . Smokeless tobacco: Never Used  . Alcohol Use: No   OB History   Grav Para Term Preterm Abortions TAB SAB Ect Mult Living                 Review of Systems History limited by critical acuity of code stroke.  Allergies  Nitrofurantoin; Protonix; Sulfa antibiotics; and Trimethoprim  Home Medications   Current Outpatient Rx  Name  Route  Sig  Dispense  Refill  . amiodarone (PACERONE) 200 MG tablet   Oral   Take 2 tablets (400 mg total) by mouth daily.   60 tablet   5   . apixaban (ELIQUIS) 2.5 MG TABS tablet   Oral   Take 1 tablet (2.5 mg total) by mouth 2 (two) times daily.   60 tablet   1   . atorvastatin  (LIPITOR) 20 MG tablet   Oral   Take 20 mg by mouth at bedtime.          . furosemide (LASIX) 40 MG tablet   Oral   Take 1 tablet (40 mg total) by mouth 2 (two) times daily.   60 tablet   1   . levETIRAcetam (KEPPRA) 500 MG tablet   Oral   Take 1 tablet (500 mg total) by mouth 2 (two) times daily.   30 tablet   0   . metFORMIN (GLUCOPHAGE-XR) 500 MG 24 hr tablet   Oral   Take 1 tablet (500 mg total) by mouth 3 (three) times daily after meals.         . metoprolol tartrate (LOPRESSOR) 25 MG tablet   Oral   Take 25 mg by mouth 2 (two) times daily.         . potassium chloride SA (K-DUR,KLOR-CON) 20 MEQ tablet   Oral   Take 20 mEq by mouth 2 (two) times daily.         . sertraline (ZOLOFT) 25 MG tablet   Oral   Take 25 mg by mouth daily.          BP 178/97  Pulse 59  Resp 15  SpO2 100% Physical Exam Nursing note and vitals reviewed.  Constitutional: Pt is awake and appears stated age. Eyes: No injection, no scleral icterus. HENT: Atraumatic, airway open without erythema or exudate.  Respiratory: No respiratory distress. Equal breathing bilaterally. Cardiovascular: Normal rate. Extremities warm and well perfused.  Abdomen: Soft, non-tender. MSK: Extremities are atraumatic without deformity. Skin: No rash, no wounds.   Neuro: GCS 15. Oriented to self. Moves all 4 extremities.      ED Course  Procedures (including critical care time) Labs Review Labs Reviewed  GLUCOSE, CAPILLARY - Abnormal; Notable for the following:    Glucose-Capillary 176 (*)    All other components within normal limits  CBC WITH DIFFERENTIAL - Abnormal; Notable for the following:    WBC 12.2 (*)    Lymphs Abs 4.7 (*)    Monocytes Absolute 1.4 (*)    All other components within normal limits  COMPREHENSIVE METABOLIC PANEL - Abnormal; Notable for the following:    Potassium 3.2 (*)    Chloride 95 (*)    Glucose, Bld 168 (*)    Calcium 7.9 (*)    Albumin 3.0 (*)    GFR calc  non Af Amer 53 (*)    GFR calc Af Amer 61 (*)    All other components within normal limits  POCT I-STAT, CHEM 8 - Abnormal; Notable for the following:    Potassium 2.7 (*)    Glucose, Bld 173 (*)    Calcium, Ion 1.01 (*)    All other components within normal limits  URINALYSIS, ROUTINE W REFLEX MICROSCOPIC  POCT I-STAT TROPONIN I   Imaging Review Ct Head Wo Contrast  01/09/2013   CLINICAL DATA:  Left side weakness. Focal seizure. Previous stroke.  EXAM: CT HEAD WITHOUT CONTRAST  TECHNIQUE: Contiguous axial images were obtained from the base of the skull through the vertex without intravenous contrast.  COMPARISON:  03/28/2012.  FINDINGS: Old right posterior watershed distribution infarct. Stable enlarged ventricles and subarachnoid spaces and patchy white matter low density in both cerebral hemispheres. No intracranial hemorrhage, mass lesion or CT evidence of acute infarction. Stable oval lucency in the right parietal bone. This is unchanged since 11/09/2007, compatible with a benign process. Normally pneumatized paranasal sinuses.  IMPRESSION: 1. No acute abnormality. 2. Old right posterior watershed distribution infarct. 3. Stable atrophy and chronic small vessel white matter ischemic changes. These results were called by telephone at the time of interpretation on 01/09/2013 at 7:09 PM to Dr. Roseanne Reno, who verbally acknowledged these results.   Electronically Signed   By: Gordan Payment M.D.   On: 01/09/2013 19:09    EKG Interpretation   None       MDM   1. Seizure    79 y.o. female w/ PMHx of DM, HTN, a fib, stroke presents as a code stroke. Arrived without concern for airway and pt moved to CT scan. EMS reports normal CBG. CT head without bleed. Neurology here in ED concerned for status. Pt given given ativan with resolution of seizure. Pt sleepy on re-eval. Per son, pt DNR/DNI. Pt monitored in ED without acute event. I called hospitalist for admission. Labs reviewed. Remarkable for  hypokalemia. IV replacement ordered x1. Pt not appropriate for PO at this time.       I independently viewed, interpreted, and used in my medical decision making all ordered lab and imaging tests. Medical Decision Making discussed with ED attending Gilda Crease, *      Charm Barges, MD  01/09/13 2332 

## 2013-01-09 NOTE — H&P (Addendum)
[facility] History and Physical  Ashwini Jago ZOX:[id number] DOB: Mar 19, 1918 DOA: 01/09/2013  Referring physician: ED PCP: [name], MD   Chief Complaint: Seizures  HPI: [name] is a 79 y.o. female with h/o previous posterior CVA s/p TPA back in Aug who presents to the ED with seizure activity in her L side that started at 5:05 pm.  She apparently never lost consiousness during seizures prior to ambulance transport per family.  On arrival was speaking and tremor had stopped.  She had persistent L sided weakness and code stroke was called, patient had recurrent seizure activity here and 2mg  IV ativan aborted the seizure but patient remained post ictal / sedated.  Review of Systems: 12 systems reviewed and otherwise negative.  Past Medical History  Diagnosis Date  . Diabetes mellitus   . Hypertension   . Hyperlipidemia   . Arthritis   . AF (atrial fibrillation)   . Cataract     decreased vision in left eye  . Peripheral vascular disease   . CHF (congestive heart failure)   . Stroke   . Tachycardia-bradycardia syndrome    Past Surgical History  Procedure Laterality Date  . Pacemaker insertion  08/08/2006; 12/2012    MDT PPM implanted by Dr Amil Amen for tachybrady syndrome and pauses with afib; generator change by Dr Johney Frame 12/2012 - MDT [id number]  . Cholecystectomy    . Thyroid surgery     Social History:  reports that she has never smoked. She has never used smokeless tobacco. She reports that she does not drink alcohol or use illicit drugs.   Allergies  Allergen Reactions  . Nitrofurantoin Nausea And Vomiting  . Protonix [Pantoprazole Sodium] Nausea Only  . Sulfa Antibiotics Hives  . Trimethoprim Nausea Only    No family history on file.  Prior to Admission medications   Medication Sig Start Date End Date Taking? Authorizing Provider  amiodarone (PACERONE) 200 MG tablet Take 2 tablets (400 mg total) by mouth daily. 12/26/12  Yes Everette Rank, MD   apixaban (ELIQUIS) 2.5 MG TABS tablet Take 1 tablet (2.5 mg total) by mouth 2 (two) times daily. 11/08/12  Yes Evlyn Kanner Love, PA-C  atorvastatin (LIPITOR) 20 MG tablet Take 20 mg by mouth at bedtime.    Yes Historical Provider, MD  furosemide (LASIX) 40 MG tablet Take 1 tablet (40 mg total) by mouth 2 (two) times daily. 12/31/12  Yes Everette Rank, MD  levETIRAcetam (KEPPRA) 500 MG tablet Take 1 tablet (500 mg total) by mouth 2 (two) times daily. 01/07/13  Yes Erick Colace, MD  metFORMIN (GLUCOPHAGE-XR) 500 MG 24 hr tablet Take 1 tablet (500 mg total) by mouth 3 (three) times daily after meals. 11/08/12  Yes Evlyn Kanner Love, PA-C  metoprolol tartrate (LOPRESSOR) 25 MG tablet Take 25 mg by mouth 2 (two) times daily.   Yes Historical Provider, MD  potassium chloride SA (K-DUR,KLOR-CON) 20 MEQ tablet Take 20 mEq by mouth 2 (two) times daily.   Yes Historical Provider, MD  sertraline (ZOLOFT) 25 MG tablet Take 25 mg by mouth daily.   Yes Historical Provider, MD   Physical Exam: Filed Vitals:   01/09/13 2030  BP: 185/63  Pulse: 58  Resp: 17    General:  NAD, resting comfortably in bed Eyes: PEERLA EOMI ENT: mucous membranes moist Neck: supple w/o JVD Cardiovascular: RRR w/o MRG Respiratory: CTA B Abdomen: soft, nt, nd, bs+ Skin: no rash nor lesion Musculoskeletal: MAE, full ROM all 4 extremities Psychiatric: normal  tone and affect Neurologic: Awake, obeying commands, R arm 4/5 strength, L arm 2/5 strength  Labs on Admission:  Basic Metabolic Panel:  Recent Labs Lab 01/09/13 1902 01/09/13 1943  NA 140 138  K 2.7* 3.2*  CL 96 95*  CO2  --  27  GLUCOSE 173* 168*  BUN 14 14  CREATININE 1.10 0.92  CALCIUM  --  7.9*   Liver Function Tests:  Recent Labs Lab 01/09/13 1943  AST 32  ALT 25  ALKPHOS 78  BILITOT 0.6  PROT 6.2  ALBUMIN 3.0*   No results found for this basename: LIPASE, AMYLASE,  in the last 168 hours No results found for this basename: AMMONIA,  in the last  168 hours CBC:  Recent Labs Lab 01/09/13 1902 01/09/13 1943  WBC  --  12.2*  NEUTROABS  --  5.8  HGB 14.6 13.7  HCT 43.0 40.8  MCV  --  95.6  PLT  --  342   Cardiac Enzymes: No results found for this basename: CKTOTAL, CKMB, CKMBINDEX, TROPONINI,  in the last 168 hours  BNP (last 3 results)  Recent Labs  10/29/12 0933 10/30/12 0611 12/27/12 1132  PROBNP 4185.0* 5109.0* 3125.0*   CBG:  Recent Labs Lab 01/09/13 1916  GLUCAP 176*    Radiological Exams on Admission: Ct Head Wo Contrast  01/09/2013   CLINICAL DATA:  Left side weakness. Focal seizure. Previous stroke.  EXAM: CT HEAD WITHOUT CONTRAST  TECHNIQUE: Contiguous axial images were obtained from the base of the skull through the vertex without intravenous contrast.  COMPARISON:  03/28/2012.  FINDINGS: Old right posterior watershed distribution infarct. Stable enlarged ventricles and subarachnoid spaces and patchy white matter low density in both cerebral hemispheres. No intracranial hemorrhage, mass lesion or CT evidence of acute infarction. Stable oval lucency in the right parietal bone. This is unchanged since 11/09/2007, compatible with a benign process. Normally pneumatized paranasal sinuses.  IMPRESSION: 1. No acute abnormality. 2. Old right posterior watershed distribution infarct. 3. Stable atrophy and chronic small vessel white matter ischemic changes. These results were called by telephone at the time of interpretation on 01/09/2013 at 7:09 PM to Dr. Roseanne Reno, who verbally acknowledged these results.   Electronically Signed   By: Gordan Payment M.D.   On: 01/09/2013 19:09    EKG: Independently reviewed.  Assessment/Plan Principal Problem:   Seizure   1. Seizure with post-ictal state - Thankfully patient's mental status appears to be rapidly and significantly improving at this time, neurology recs in chart, have made the adjustment to the Keppra dose.  Admit to inpatient, seizure and fall  precautions. 2. Hypokalemia - replace and recheck in AM.    Code Status: DNR/DNI per family(must indicate code status--if unknown or must be presumed, indicate so) Family Communication: Family at bedside (indicate person spoken with, if applicable, with phone number if by telephone) Disposition Plan: Admit to inpatient (indicate anticipated LOS)  Time spent: 70 min  Ralphine Hinks M. [facility] Pager [phone]  If 7PM-7AM, please contact night-coverage [email or web address] Password [facility] 01/09/2013, 9:44 PM

## 2013-01-09 NOTE — ED Notes (Signed)
Report given to [facility] and [place]

## 2013-01-09 NOTE — Consult Note (Signed)
Neurology Consultation Reason for Consult: Seizures Referring Physician: Blinda Leatherwood, C  CC: Seizures  History is obtained from:Patient  HPI: [name] is a 79 y.o. female with a history of previous posterior CVA s/p tpa in August who presents with seizure activity that started at approximately 5:05 pm. She was transported via EMS and on arrival was speaking and the movements seemed to have stopped. She had persistent left weakness and therefore a code stroke was activated. She had recurrent seizure activity here and seems to have aborted with 2mg  IV ativan but remains post ictal at this time.    LKW: 5:05 pm tpa given?: no, seizure at onset    ROS: Unable to assess secondary to patient's altered mental status.    Past Medical History  Diagnosis Date  . Diabetes mellitus   . Hypertension   . Hyperlipidemia   . Arthritis   . AF (atrial fibrillation)   . Cataract     decreased vision in left eye  . Peripheral vascular disease   . CHF (congestive heart failure)   . Stroke   . Tachycardia-bradycardia syndrome     Family History: Unable to assess secondary to patient's altered mental status.    Social History: Tob: Unable to assess secondary to patient's altered mental status.    Exam: Current vital signs: BP 212/73  Pulse 82  Resp 16  SpO2 100% Vital signs in last 24 hours: Pulse Rate:  [82] 82 (11/13 1905) Resp:  [16] 16 (11/13 1905) BP: (212)/(73) 212/73 mmHg (11/13 1905) SpO2:  [100 %] 100 % (11/13 1905)  General: in bed, left arm twitching CV: RRR Mental Status: Patient is awake, alert, She initially follows commands and answers questions but stops during recurrent seizure.  Cranial Nerves: II: Visual Fields were reportedly full per nursing NIH exam, but prior to my checking fields she had recurrent seizure, was not blinking to threat. Pupils are equal, round, and reactive to light.  Discs are difficult to visualize.  III,IV, VI: Left gaze deviation with  nystagmus, aborts after ativan.  V/VII: blinks to eyelid stimulation.  Motor: Tone is decreased on left.  Bulk is normal. Seems to move right arm and leg well, though does not cooperate with exam. Left leg with minimal withdrawal,  Sensory: Sensation appears to be decreased on the left.  Deep Tendon Reflexes: 2+ and symmetric in the biceps and patellae.  Cerebellar: Unable to assess secondary to patient's altered mental status.  Gait: Unable to assess secondary to patient's altered mental status.     I have reviewed labs in epic and the results pertinent to this consultation are: hypokalemia  I have reviewed the images obtained:CT head - previous posterior MCA territory infarct on right.   Impression: 79 yo F with status epilepticus in the setting of previous infarct. She seems to have aborted with 2mg  IV ativan, but continues to be post-ictal. She would not want intubation or resussicitation per son in teh event of respiratory or failure or arrest.   Recommendations: 1) Keppra 1gm IV now 2) Increase keppra to 750mg  BID(max dose based on GFR 45)  3) Infectious workup for causes of decreased seizure threshold 4) EEG tomorrow.    Ritta Slot, MD [facility] [phone]  If 7pm- 7am, please page neurology on call at [phone].

## 2013-01-10 DIAGNOSIS — I5032 Chronic diastolic (congestive) heart failure: Secondary | ICD-10-CM

## 2013-01-10 DIAGNOSIS — I4891 Unspecified atrial fibrillation: Secondary | ICD-10-CM

## 2013-01-10 DIAGNOSIS — I5030 Unspecified diastolic (congestive) heart failure: Secondary | ICD-10-CM

## 2013-01-10 DIAGNOSIS — I509 Heart failure, unspecified: Secondary | ICD-10-CM

## 2013-01-10 LAB — BASIC METABOLIC PANEL
Calcium: 7.4 mg/dL — ABNORMAL LOW (ref 8.4–10.5)
GFR calc Af Amer: 65 mL/min — ABNORMAL LOW (ref >90)
GFR calc non Af Amer: 57 mL/min — ABNORMAL LOW (ref >90)
Glucose, Bld: 116 mg/dL — ABNORMAL HIGH (ref 70–99)
Potassium: 3.6 mEq/L (ref 3.5–5.1)
Sodium: 143 mEq/L (ref 135–145)

## 2013-01-10 LAB — CBC
HCT: 36.1 % (ref 36.0–46.0)
Hemoglobin: 12.1 g/dL (ref 12.0–15.0)
MCH: 31.3 pg (ref 26.0–34.0)
MCHC: 33.5 g/dL (ref 30.0–36.0)
RDW: 15.1 % (ref 11.5–15.5)

## 2013-01-10 LAB — GLUCOSE, CAPILLARY: Glucose-Capillary: 114 mg/dL — ABNORMAL HIGH (ref 70–99)

## 2013-01-10 NOTE — Progress Notes (Signed)
[facility] PROGRESS NOTE  [name] JXB:[id number] DOB: 07/11/1918 DOA: 01/09/2013 PCP: Wanda Saupe, MD  Assessment/Plan: 1. Recurrent Seizure - no recurrence since IV keppra and increased dose to 750 mg po BID, appreciate neurology following.  2. Hypokalemia - replaced 3. CHF - continue cardiac medications 4. Atrial Fibrillation - continue eliquis for anticoagulation  Code Status: DNR/DNI Family Communication: no family at bedside   HPI/Subjective: Pt without complaints.  No further seizure.   Objective: Filed Vitals:   01/10/13 0544  BP: 139/48  Pulse: 60  Temp: 97.9 F (36.6 C)  Resp: 18    Intake/Output Summary (Last 24 hours) at 01/10/13 1048 Last data filed at 01/10/13 0930  Gross per 24 hour  Intake    123 ml  Output      0 ml  Net    123 ml   Filed Weights   01/09/13 2205  Weight: 172 lb 6.4 oz (78.2 kg)    Exam:  General: NAD, resting comfortably, awake Eyes: PEERLA EOMI  ENT: mucous membranes moist  Neck: supple w/o JVD  Cardiovascular: RRR w/o MRG  Respiratory: CTA B  Abdomen: soft, nt, nd, bs+  Skin: no rash nor lesion  Musculoskeletal: MAE, full ROM all 4 extremities  Psychiatric: normal affect  Neurologic: Awake, alert, R arm 4/5 strength, L arm 2/5 strength  Data Reviewed: Basic Metabolic Panel:  Recent Labs Lab 01/09/13 1902 01/09/13 1943 01/10/13 0501  NA 140 138 143  K 2.7* 3.2* 3.6  CL 96 95* 101  CO2  --  27 32  GLUCOSE 173* 168* 116*  BUN 14 14 14   CREATININE 1.10 0.92 0.87  CALCIUM  --  7.9* 7.4*   Liver Function Tests:  Recent Labs Lab 01/09/13 1943  AST 32  ALT 25  ALKPHOS 78  BILITOT 0.6  PROT 6.2  ALBUMIN 3.0*   No results found for this basename: LIPASE, AMYLASE,  in the last 168 hours No results found for this basename: AMMONIA,  in the last 168 hours CBC:  Recent Labs Lab 01/09/13 1902 01/09/13 1943 01/10/13 0501  WBC  --  12.2* 9.4  NEUTROABS  --  5.8  --   HGB 14.6 13.7 12.1   HCT 43.0 40.8 36.1  MCV  --  95.6 93.5  PLT  --  342 283   Cardiac Enzymes: No results found for this basename: CKTOTAL, CKMB, CKMBINDEX, TROPONINI,  in the last 168 hours BNP (last 3 results)  Recent Labs  10/29/12 0933 10/30/12 0611 12/27/12 1132  PROBNP 4185.0* 5109.0* 3125.0*   CBG:  Recent Labs Lab 01/09/13 1916  GLUCAP 176*    Recent Results (from the past 240 hour(s))  SURGICAL PCR SCREEN     Status: None   Collection Time    01/02/13  1:57 PM      Result Value Range Status   MRSA, PCR NEGATIVE  NEGATIVE Final   Staphylococcus aureus NEGATIVE  NEGATIVE Final   Comment:            The Xpert SA Assay (FDA     approved for NASAL specimens     in patients over 29 years of age),     is one component of     a comprehensive surveillance     program.  Test performance has     been validated by [employer] for patients greater     than or equal to 44 year old.  It is not intended     to diagnose infection nor to     guide or monitor treatment.     Studies: Ct Head Wo Contrast  01/09/2013   CLINICAL DATA:  Left side weakness. Focal seizure. Previous stroke.  EXAM: CT HEAD WITHOUT CONTRAST  TECHNIQUE: Contiguous axial images were obtained from the base of the skull through the vertex without intravenous contrast.  COMPARISON:  03/28/2012.  FINDINGS: Old right posterior watershed distribution infarct. Stable enlarged ventricles and subarachnoid spaces and patchy white matter low density in both cerebral hemispheres. No intracranial hemorrhage, mass lesion or CT evidence of acute infarction. Stable oval lucency in the right parietal bone. This is unchanged since 11/09/2007, compatible with a benign process. Normally pneumatized paranasal sinuses.  IMPRESSION: 1. No acute abnormality. 2. Old right posterior watershed distribution infarct. 3. Stable atrophy and chronic small vessel white matter ischemic changes. These results were called by telephone at the time of  interpretation on 01/09/2013 at 7:09 PM to Dr. Roseanne Reno, who verbally acknowledged these results.   Electronically Signed   By: Gordan Payment M.D.   On: 01/09/2013 19:09    Scheduled Meds: . amiodarone  400 mg Oral Daily  . apixaban  2.5 mg Oral BID  . atorvastatin  20 mg Oral QHS  . furosemide  40 mg Oral BID  . levETIRAcetam  750 mg Oral BID  . metFORMIN  500 mg Oral TID PC  . metoprolol tartrate  25 mg Oral BID  . potassium chloride SA  20 mEq Oral BID  . sertraline  25 mg Oral Daily  . sodium chloride  3 mL Intravenous Q12H   Continuous Infusions:   Principal Problem:   Seizure   [name] [facility]  [facility] Pager [phone]. If 7PM-7AM, please contact night-coverage at [email or web address], password [email or web address] 01/10/2013, 10:48 AM  LOS: 1 day

## 2013-01-10 NOTE — Progress Notes (Signed)
Subjective: No recurrence of seizure activity reported. Patient has been moving left extremities well.  Objective: Current vital signs: BP 139/48  Pulse 60  Temp(Src) 97.9 F (36.6 C) (Oral)  Resp 18  Wt 78.2 kg (172 lb 6.4 oz)  SpO2 96%  Neurologic Exam: Patient is initially sleeping but easy to arouse. She was well oriented to time as well as place. She had no new complaints. She followed commands well. Pupils and extraocular movements were normal. No facial weakness was noted. Speech was normal. Strength of upper and lower extremities was normal and symmetrical throughout. Coordination of upper extremities was normal.  Medications: I have reviewed the patient's current medications.  Assessment/Plan: 79 year old lady presenting with recurrent focal seizure activity with a history of stroke 2-1/2 months ago and associated seizures at that time. She has not had recurrent seizure activity since Keppra 1 g was given IV, and maintenance dose increased following admission.  Recommend no changes in current management with continuing Keppra 750 mg twice a day. No further neurodiagnostic studies are indicated, including no need for EEG, as results would not change management plans.  We will continue to follow this patient with you.  C.R. Roseanne Reno, MD [facility]  01/10/2013  10:28 AM

## 2013-01-10 NOTE — Progress Notes (Signed)
Utilization review completed. Shonia Skilling, RN, BSN. 

## 2013-01-10 NOTE — Evaluation (Signed)
Physical Therapy Evaluation Patient Details Name: Tiaja Hagan MRN: [id number] DOB: Dec 04, 1918 Today's Date: 01/10/2013 Time: 4098-1191 PT Time Calculation (min): 34 min  PT Assessment / Plan / Recommendation History of Present Illness  79 year old female admitted for seizure s/p stroke in August. Was ambulatory with assist and RW in the home, daughter lives with pt since CVA.   Clinical Impression  Pt moving at a min/mod assist level. Leans onto RW with forearms for support, per chart review this is how she ambulates. With stand pivot pt had decreased eccentric control to sit on bed side commode and pinched her Lt. Index finger between walker and something else (? BSC)- pt nor therapist could figure out how finger got pinched but it resulted in small skin tear with minimal loss of blood, pressure applied and bleed stopped <1 min, RN called in and covered small cut. Pt reported pain with initial onset but no pain within 5 min.   Unable to reach daughter who lives with pt, spoke with son who states they would like to take pt home if possible. I feel pt would be a good candidate for SNF however if family wishes to take pt home I recommend HHPT and close supervision/assist whenever pt is on her feet.     PT Assessment  Patient needs continued PT services    Follow Up Recommendations  Home health PT;Supervision for mobility/OOB;SNF    Does the patient have the potential to tolerate intense rehabilitation    No     Equipment Recommendations  None recommended by PT       Frequency Min 3X/week    Precautions / Restrictions Precautions Precautions: Fall   Pertinent Vitals/Pain Not rated pain with pinching finger, resolved within 5 min without medication      Mobility  Bed Mobility Bed Mobility: Supine to Sit;Sit to Supine Supine to Sit: 4: Min assist;With rails;HOB elevated Sit to Supine: With rail;HOB flat;3: Mod assist Details for Bed Mobility Assistance: Min/mod assist to  manage legs. Cues for efficiency.  Transfers Transfers: Stand Pivot Transfers Sit to Stand: 4: Min assist;With armrests;From chair/3-in-1;From bed Stand to Sit: 3: Mod assist;4: Min assist;To bed;To chair/3-in-1 Stand Pivot Transfers: 4: Min assist Details for Transfer Assistance: Multiple attempts needed. Decreased control of descent (see PT comments) Pt with decreased response to cues for UE placement due to [facility] despite PT speaking VERY loudly.  Ambulation/Gait Ambulation/Gait Assistance: 4: Min assist Ambulation Distance (Feet): 10 Feet Assistive device: Rolling walker Ambulation/Gait Assistance Details: Pt remains flexed over RW with forearms on handles of RW, unable to stand erect. Per chart review pt has been walking like this at home for quite a good while despite cues from HHPT.  Gait Pattern: Step-through pattern;Decreased stride length;Trunk flexed Gait velocity: decreased Stairs: No        PT Diagnosis: Difficulty walking;Generalized weakness  PT Problem List: Decreased strength;Decreased activity tolerance;Decreased mobility;Decreased knowledge of use of DME;Decreased range of motion;Decreased balance PT Treatment Interventions: DME instruction;Gait training;Functional mobility training;Therapeutic activities;Therapeutic exercise;Patient/family education;Balance training;Neuromuscular re-education     PT Goals(Current goals can be found in the care plan section) Acute Rehab PT Goals Patient Stated Goal: be able to return home PT Goal Formulation: With patient/family Time For Goal Achievement: 01/17/13 Potential to Achieve Goals: Fair  Visit Information  Last PT Received On: 01/10/13 Assistance Needed: +1 History of Present Illness: 79 year old female admitted for seizure s/p stroke in August. Was ambulatory with assist and RW in the home, daughter  lives with pt since CVA.        Prior Functioning  Home Living Family/patient expects to be discharged to:: Private  residence Living Arrangements: Children Available Help at Discharge: Family;Available 24 hours/day Type of Home: House Home Access: Stairs to enter [employer] of Steps: 1 per chart review, pt reports none Entrance Stairs-Rails: None Home Layout: One level Home Equipment: Walker - 4 wheels;Shower seat;Grab bars - tub/shower;Grab bars - toilet;Toilet riser Additional Comments: Dgtr from [place] has been staying with pt since Aug and states she can stay potentially until April but will have to return home at some point Prior Function Level of Independence: Needs assistance Gait / Transfers Assistance Needed: uses rollator in home, daughter with pt during ambulation. Pt reports she has a wheelchair but does not use it as she tries to walk as much as possible.  ADL's / Homemaking Assistance Needed: family does shopping and housework, dgtr assists with bathing and dressing Communication Communication: HOH (very HOH, hearing aides being repaired) Dominant Hand: Right    Cognition  Cognition Arousal/Alertness: Awake/alert Behavior During Therapy: WFL for tasks assessed/performed Overall Cognitive Status: Difficult to assess Difficult to assess due to: Hard of hearing/deaf    Extremity/Trunk Assessment Upper Extremity Assessment Upper Extremity Assessment: Defer to OT evaluation Lower Extremity Assessment Lower Extremity Assessment: Generalized weakness;LLE deficits/detail;RLE deficits/detail RLE Deficits / Details: Grossly 3/5, limited knee mobility due to significant arthritis. Rt worse than Lt. RLE Sensation:  (pt denies, had difficulty testing due to [facility]) LLE Deficits / Details: Maybe slightly weaker than Rt., still grossly 3/5 strength.  LLE Sensation:  (pt denies, had difficulty testing due to [facility] ) Cervical / Trunk Assessment Cervical / Trunk Assessment: Kyphotic   Balance Balance Balance Assessed: Yes Static Sitting Balance Static Sitting - Balance Support: Feet  supported;No upper extremity supported Static Sitting - Level of Assistance: 5: Stand by assistance Static Standing Balance Static Standing - Balance Support: Bilateral upper extremity supported (forearms on RW) Static Standing - Comment/# of Minutes: Limited by fatigue, relies on RW for stability  End of Session PT - End of Session Equipment Utilized During Treatment: Gait belt Activity Tolerance: Patient limited by fatigue Patient left: with call bell/phone within reach;in bed Nurse Communication: Mobility status;Other (comment) (cut to finger)  GP     Wilhemina Bonito 01/10/2013, 1:41 PM

## 2013-01-11 DIAGNOSIS — Z515 Encounter for palliative care: Secondary | ICD-10-CM

## 2013-01-11 DIAGNOSIS — I509 Heart failure, unspecified: Secondary | ICD-10-CM

## 2013-01-11 DIAGNOSIS — E119 Type 2 diabetes mellitus without complications: Secondary | ICD-10-CM

## 2013-01-11 DIAGNOSIS — I359 Nonrheumatic aortic valve disorder, unspecified: Secondary | ICD-10-CM

## 2013-01-11 DIAGNOSIS — I495 Sick sinus syndrome: Secondary | ICD-10-CM

## 2013-01-11 DIAGNOSIS — M79609 Pain in unspecified limb: Secondary | ICD-10-CM

## 2013-01-11 LAB — GLUCOSE, CAPILLARY: Glucose-Capillary: 102 mg/dL — ABNORMAL HIGH (ref 70–99)

## 2013-01-11 MED ORDER — ACETAMINOPHEN 325 MG PO TABS
650.0000 mg | ORAL_TABLET | Freq: Four times a day (QID) | ORAL | Status: DC | PRN
  Administered 2013-01-12: 650 mg via ORAL
  Filled 2013-01-11: qty 2

## 2013-01-11 NOTE — Progress Notes (Signed)
[facility] PROGRESS NOTE  Dontavia Brand ZOX:[id number] DOB: 25-May-1946 DOA: 01/09/2013 PCP: [name], MD  Assessment/Plan: 1. Recurrent Seizure - no recurrence since IV keppra and increased dose to 750 mg po BID, appreciate neurology following.   2. Hypokalemia - replaced 3. CHF - continue cardiac medications 4. Atrial Fibrillation - continue eliquis for anticoagulation 5. Physical Deconditioning - Family to decide about HHPT vs SNF and are considering hospice care.  They have asked for palliative medicine to consult.  Will request consultation.   Code Status: DNR/DNI  Family Communication: son and daughter at bedside Dispo: Family requesting palliative medicine consult.  They are undecided about taking pt home with HHPT vs SNF.    HPI/Subjective: Pt without complaints.  She is not ambulating very well.  She is requiring 2 person assist for getting out of bed to commode.    Objective: Filed Vitals:   01/11/13 0500  BP: 133/45  Pulse: 60  Temp: 97.8 F (36.6 C)  Resp: 18    Intake/Output Summary (Last 24 hours) at 01/11/13 0941 Last data filed at 01/11/13 0604  Gross per 24 hour  Intake      0 ml  Output    650 ml  Net   -650 ml   Filed Weights   01/09/13 2205 01/11/13 0500 01/11/13 0700  Weight: 172 lb 6.4 oz (78.2 kg) 186 lb 3.2 oz (84.46 kg) 186 lb 4.6 oz (84.5 kg)    Exam:  General: NAD, resting comfortably, awake, alert Eyes: PEERLA EOMI  ENT: mucous membranes moist  Neck: supple w/o JVD  Cardiovascular: RRR w/o MRG  Respiratory: CTA B Abdomen: soft, nt, nd, bs+  Skin: no rash nor lesion  Musculoskeletal: MAE, full ROM all 4 extremities  Psychiatric: normal affect  Neurologic: Awake, alert, R arm 4/5 strength, L arm 2/5 strength  Data Reviewed: Basic Metabolic Panel:  Recent Labs Lab 01/09/13 1902 01/09/13 1943 01/10/13 0501  NA 140 138 143  K 2.7* 3.2* 3.6  CL 96 95* 101  CO2  --  27 32  GLUCOSE 173* 168* 116*  BUN 14 14 14    CREATININE 1.10 0.92 0.87  CALCIUM  --  7.9* 7.4*   Liver Function Tests:  Recent Labs Lab 01/09/13 1943  AST 32  ALT 25  ALKPHOS 78  BILITOT 0.6  PROT 6.2  ALBUMIN 3.0*   No results found for this basename: LIPASE, AMYLASE,  in the last 168 hours No results found for this basename: AMMONIA,  in the last 168 hours CBC:  Recent Labs Lab 01/09/13 1902 01/09/13 1943 01/10/13 0501  WBC  --  12.2* 9.4  NEUTROABS  --  5.8  --   HGB 14.6 13.7 12.1  HCT 43.0 40.8 36.1  MCV  --  95.6 93.5  PLT  --  342 283   Cardiac Enzymes: No results found for this basename: CKTOTAL, CKMB, CKMBINDEX, TROPONINI,  in the last 168 hours BNP (last 3 results)  Recent Labs  10/29/12 0933 10/30/12 0611 12/27/12 1132  PROBNP 4185.0* 5109.0* 3125.0*   CBG:  Recent Labs Lab 01/09/13 1916 01/10/13 2241 01/11/13 0740  GLUCAP 176* 114* 102*    Recent Results (from the past 240 hour(s))  SURGICAL PCR SCREEN     Status: None   Collection Time    01/02/13  1:57 PM      Result Value Range Status   MRSA, PCR NEGATIVE  NEGATIVE Final   Staphylococcus aureus NEGATIVE  NEGATIVE Final  Comment:            The Xpert SA Assay (FDA     approved for NASAL specimens     in patients over 40 years of age),     is one component of     a comprehensive surveillance     program.  Test performance has     been validated by [employer] for patients greater     than or equal to 21 year old.     It is not intended     to diagnose infection nor to     guide or monitor treatment.     Studies: Ct Head Wo Contrast  01/09/2013   CLINICAL DATA:  Left side weakness. Focal seizure. Previous stroke.  EXAM: CT HEAD WITHOUT CONTRAST  TECHNIQUE: Contiguous axial images were obtained from the base of the skull through the vertex without intravenous contrast.  COMPARISON:  03/28/2012.  FINDINGS: Old right posterior watershed distribution infarct. Stable enlarged ventricles and subarachnoid spaces and patchy  white matter low density in both cerebral hemispheres. No intracranial hemorrhage, mass lesion or CT evidence of acute infarction. Stable oval lucency in the right parietal bone. This is unchanged since 11/09/2007, compatible with a benign process. Normally pneumatized paranasal sinuses.  IMPRESSION: 1. No acute abnormality. 2. Old right posterior watershed distribution infarct. 3. Stable atrophy and chronic small vessel white matter ischemic changes. These results were called by telephone at the time of interpretation on 01/09/2013 at 7:09 PM to Dr. Roseanne Reno, who verbally acknowledged these results.   Electronically Signed   By: Gordan Payment M.D.   On: 01/09/2013 19:09    Scheduled Meds: . amiodarone  400 mg Oral Daily  . apixaban  2.5 mg Oral BID  . atorvastatin  20 mg Oral QHS  . furosemide  40 mg Oral BID  . levETIRAcetam  750 mg Oral BID  . metFORMIN  500 mg Oral TID PC  . metoprolol tartrate  25 mg Oral BID  . potassium chloride SA  20 mEq Oral BID  . sertraline  25 mg Oral Daily  . sodium chloride  3 mL Intravenous Q12H   Continuous Infusions:   Principal Problem:   Seizure  [facility]  [facility] Pager [phone]. If 7PM-7AM, please contact night-coverage at [email or web address], password [facility] 01/11/2013, 9:41 AM  LOS: 2 days

## 2013-01-11 NOTE — Progress Notes (Signed)
Thank you for consulting the Palliative Medicine Team at [facility] to meet your patient's and family's needs.   The reason that you asked [place] to see your patient is  For GOC  We have scheduled your patient for a meeting: 11/15 at 4 pm  The Surrogate decision make is: Vassie Loll [facility];  [phone] Contact information:  Other family members that need to be present:  At their discretion    Your patient is able/unable to participate: yes  Additional Narrative:  Family disagreeing related to goals of care.  Domingue Coltrain L. Ladona Ridgel, MD MBA The Palliative Medicine Team at [facility] Phone: [phone] Pager: [phone]

## 2013-01-11 NOTE — Progress Notes (Signed)
Pt chart is indicating she is DNR but we have pt as Full Code. Kindly address . Also pt family member indicating she is significantly  weaker on her L side . Kindly address with family member. thanks

## 2013-01-11 NOTE — Consult Note (Addendum)
Cardiology Consultation Note  Patient ID: [name], MRN: [id number], DOB/AGE: May 29, 1918 79 y.o. Admit date: 01/09/2013   Date of Consult: 01/11/2013 Primary Physician: [name], MD Primary Cardiologist:  Chief Complaint: seizure, PPM  Reason for Consult: PPM functioning status   Assessment   Heart Failure with preserved EF ( HF pEF ) chronic , nl EF  Moderate to severe AI  Sick Sinus syndrome S/p PPM generator change by Dr Johney Frame 01/02/2013 Paroxysmal Atial fibrillation   Plan  -Pt appears compensated from HF standpoint and pacemaker based on tele is functioning appropriately  - Continue current output regimen with Amio , Apixaban and metoprolol  -  Call device rep for PPM interrogation  - Followup with Dr Johney Frame as planned    89 yr with hx of PPM for SSS , HF pEF, Paroxysmal Afib, AI currently admitted for  Focal left sided seizure.   HPI ; pt is hard of hearing and is poor historian. She states that she had the ' shakes ' which was diagnosed as focal seizure. On 01/02/2013 her PPM battery was changed due to ERI. Since then she denies any complains such as SOB , orthopnea, PND , LE edema, syncope, bleeding . Pt is not very active and is being considered by family for Hospice.  ROS per HPI   Called to bed side @ 1:30 am Faster rate. AV paced with ? Appropriate sensing . Pt was having focal seizure at the time.   Past Medical History  Diagnosis Date  . Diabetes mellitus   . Hypertension   . Hyperlipidemia   . Arthritis   . AF (atrial fibrillation)   . Cataract     decreased vision in left eye  . Peripheral vascular disease   . CHF (congestive heart failure)   . Stroke   . Tachycardia-bradycardia syndrome        Surgical History:  Past Surgical History  Procedure Laterality Date  . Pacemaker insertion  08/08/2006; 12/2012    MDT PPM implanted by Dr Amil Amen for tachybrady syndrome and pauses with afib; generator change by Dr Johney Frame 12/2012 - MDT [id number]    . Cholecystectomy    . Thyroid surgery       Home Meds: No current facility-administered medications on file prior to encounter.   Current Outpatient Prescriptions on File Prior to Encounter  Medication Sig Dispense Refill  . amiodarone (PACERONE) 200 MG tablet Take 2 tablets (400 mg total) by mouth daily.  60 tablet  5  . apixaban (ELIQUIS) 2.5 MG TABS tablet Take 1 tablet (2.5 mg total) by mouth 2 (two) times daily.  60 tablet  1  . atorvastatin (LIPITOR) 20 MG tablet Take 20 mg by mouth at bedtime.       . furosemide (LASIX) 40 MG tablet Take 1 tablet (40 mg total) by mouth 2 (two) times daily.  60 tablet  1  . levETIRAcetam (KEPPRA) 500 MG tablet Take 1 tablet (500 mg total) by mouth 2 (two) times daily.  30 tablet  0  . metFORMIN (GLUCOPHAGE-XR) 500 MG 24 hr tablet Take 1 tablet (500 mg total) by mouth 3 (three) times daily after meals.      . metoprolol tartrate (LOPRESSOR) 25 MG tablet Take 25 mg by mouth 2 (two) times daily.      . potassium chloride SA (K-DUR,KLOR-CON) 20 MEQ tablet Take 20 mEq by mouth 2 (two) times daily.      . sertraline (ZOLOFT) 25 MG tablet  Take 25 mg by mouth daily.         Inpatient Medications:  . amiodarone  400 mg Oral Daily  . apixaban  2.5 mg Oral BID  . atorvastatin  20 mg Oral QHS  . furosemide  40 mg Oral BID  . levETIRAcetam  750 mg Oral BID  . metFORMIN  500 mg Oral TID PC  . metoprolol tartrate  25 mg Oral BID  . potassium chloride SA  20 mEq Oral BID  . sertraline  25 mg Oral Daily  . sodium chloride  3 mL Intravenous Q12H      Allergies:  Allergies  Allergen Reactions  . Nitrofurantoin Nausea And Vomiting  . Protonix [Pantoprazole Sodium] Nausea Only  . Sulfa Antibiotics Hives  . Trimethoprim Nausea Only    History   Social History  . Marital Status: Married    Spouse Name: N/A    Number of Children: N/A  . Years of Education: N/A   Occupational History  . Not on file.   Social History Main Topics  . Smoking  status: Never Smoker   . Smokeless tobacco: Never Used  . Alcohol Use: No  . Drug Use: No  . Sexual Activity: Not on file   Other Topics Concern  . Not on file   Social History Narrative  . No narrative on file     No family history on file.    Labs: No results found for this basename: CKTOTAL, CKMB, TROPONINI,  in the last 72 hours Lab Results  Component Value Date   WBC 9.4 01/10/2013   HGB 12.1 01/10/2013   HCT 36.1 01/10/2013   MCV 93.5 01/10/2013   PLT 283 01/10/2013    Recent Labs Lab 01/09/13 1943 01/10/13 0501  NA 138 143  K 3.2* 3.6  CL 95* 101  CO2 27 32  BUN 14 14  CREATININE 0.92 0.87  CALCIUM 7.9* 7.4*  PROT 6.2  --   BILITOT 0.6  --   ALKPHOS 78  --   ALT 25  --   AST 32  --   GLUCOSE 168* 116*   Lab Results  Component Value Date   CHOL 124 10/27/2012   HDL 75 10/27/2012   LDLCALC 32 10/27/2012   TRIG 87 10/27/2012   No results found for this basename: DDIMER    Radiology/Studies:  Dg Chest 2 View  12/27/2012     IMPRESSION: 1. Increased left pleural effusion and basilar airspace disease. Pneumonia is not excluded. 2. Small right pleural effusion and associated atelectasis. 3. Emphysema. 4. Cardiomegaly and mild pulmonary vascular congestion without frank edema otherwise.   Electronically Signed   By: Gennette Pac M.D.   On: 12/27/2012 11:59   Ct Head Wo Contrast  01/09/2013  IMPRESSION: 1. No acute abnormality. 2. Old right posterior watershed distribution infarct. 3. Stable atrophy and chronic small vessel white matter ischemic changes. These results were called by telephone at the time of interpretation on 01/09/2013 at 7:09 PM to Dr. Roseanne Reno, who verbally acknowledged these results.   Electronically Signed   By: Gordan Payment M.D.   On: 01/09/2013 19:09    EKG:  01/10/2013 NSR , Asensed V paced , PVC   Telemetry  Atrial paced   Echo 10/26/2012 - Left ventricle: The cavity size was normal. Wall thickness was normal. Systolic  function was normal. The estimated ejection fraction was in the range of 55% to 65%. Wall motion was normal; there were no regional  wall motion abnormalities. Doppler parameters are consistent with a reversible restrictive pattern, indicative of decreased left ventricular diastolic compliance and/or increased left atrial pressure (grade 3 diastolic dysfunction). Doppler parameters are consistent with high ventricular filling pressure. - Aortic valve: Moderate to severe regurgitation. - Mitral valve: Moderately to severely calcified annulus. Severely thickened, moderately calcified leaflets anterior. Severe thickening and calcification, with involvement of anterior leaflet chords. Cannot exclude vegetation. Mild to moderate regurgitation. - Pericardium, extracardiac: There was a left pleural effusion.     Physical Exam Blood pressure 152/55, pulse 62, temperature 97.7 F (36.5 C), temperature source Oral, resp. rate 18, height 5' 1.81" (1.57 m), weight 84.5 kg (186 lb 4.6 oz), SpO2 94.00%. General: no acute distress. Head: Normocephalic, atraumatic, sclera non-icteric, no xanthomas, nares are without discharge.  Neck: Negative for carotid bruits. JVD not elevated. Lungs: Clear bilaterally to auscultation without wheezes, rales, or rhonchi. Breathing is unlabored. Heart: RRR with S1 S2. No murmurs, rubs, or gallops appreciated. Abdomen: Soft, non-tender, non-distended with normoactive bowel sounds. No hepatomegaly. No rebound/guarding. No obvious abdominal masses. Extremities: No clubbing or cyanosis. No edema.  Distal radial pulses are 2+ and equal bilaterally. Psych:  Responds to questions appropriately with a normal affect.     Cira Servant , M.D  01/11/2013, 5:35 PM

## 2013-01-11 NOTE — Progress Notes (Signed)
Subjective: No recurrence of seizure activity reported. Patient has had no changes in mental status.  Objective: Current vital signs: BP 133/45  Pulse 60  Temp(Src) 97.8 F (36.6 C) (Oral)  Resp 18  Ht 5' 1.81" (1.57 m)  Wt 84.5 kg (186 lb 4.6 oz)  BMI 34.28 kg/m2  SpO2 93%  Neurologic Exam: Alert and in no acute distress. Patient is well-oriented to time as well as place. Speech was normal. Face was symmetrical with no weakness. Patient moved extremities equally with normal strength throughout.  Medications: I have reviewed the patient's current medications.  Assessment/Plan: 79 year old lady who was admitted following recurrent left focal motor seizure activity. Patient is a well with an increase Keppra or seizure control.  Recommend no changes in current management. Continue Keppra to 750 mg twice a day.  No further neurologic diagnostic studies are indicated nor further neurological intervention. I will plan to see her on an as-needed basis after this visit.  C.R. Roseanne Reno, MD [facility] [phone]  01/11/2013  10:54 AM

## 2013-01-11 NOTE — Evaluation (Signed)
Occupational Therapy Evaluation Patient Details Name: [name] MRN: [id number] DOB: 1919/02/24 Today's Date: 01/11/2013 Time: 7829-5621 OT Time Calculation (min): 60 min  OT Assessment / Plan / Recommendation History of present illness 79 year old female admitted for seizure s/p stroke in August. Was ambulatory with assist and RW in the home, daughter lives with pt since CVA.    Clinical Impression   Pt presents with generalized weakness requiring mod assist for basic transfers and inability to ambulate.  She is dependent in bathing, dressing, grooming, and toileting.  Daughter and patient tearful during session.  Daughter with many questions about whether she will continue to be able to care for pt at home.  Recommending ST rehab in SNF with the hope that pt will be strong enough to return home with +1 assist.    OT Assessment  Patient needs continued OT Services    Follow Up Recommendations  SNF;Supervision/Assistance - 24 hour (family having a goals of care meeting today)    Barriers to Discharge      Equipment Recommendations  None recommended by OT    Recommendations for Other Services    Frequency  Min 2X/week    Precautions / Restrictions Precautions Precautions: Fall Restrictions Weight Bearing Restrictions: No   Pertinent Vitals/Pain HA, 5/10, RN notified, ice applied to head    ADL  Eating/Feeding: Minimal assistance Where Assessed - Eating/Feeding: Bed level Grooming: Moderate assistance Where Assessed - Grooming: Supine, head of bed up Upper Body Bathing: +1 Total assistance Where Assessed - Upper Body Bathing: Unsupported sitting Lower Body Bathing: +1 Total assistance Where Assessed - Lower Body Bathing: Unsupported sitting Upper Body Dressing: Moderate assistance Where Assessed - Upper Body Dressing: Unsupported sitting Lower Body Dressing: +1 Total assistance Where Assessed - Lower Body Dressing: Unsupported sitting Toilet Transfer: Moderate  assistance Toilet Transfer Method: Stand pivot Acupuncturist: Bedside commode Toileting - Clothing Manipulation and Hygiene: +1 Total assistance Where Assessed - Toileting Clothing Manipulation and Hygiene: Standing Equipment Used: Gait belt Transfers/Ambulation Related to ADLs: mod assist, sits prematurely, fatigues after 2 transfers ADL Comments: Pt was able to perform her bathing and dressing with distant supervision prior to this admission, but it took her an extended period of time and she had difficulty using a reacher due to ataxia.    OT Diagnosis: Generalized weakness;Acute pain  OT Problem List: Decreased strength;Decreased activity tolerance;Impaired balance (sitting and/or standing);Decreased knowledge of use of DME or AE;Obesity;Impaired UE functional use;Pain OT Treatment Interventions: Self-care/ADL training;DME and/or AE instruction;Patient/family education   OT Goals(Current goals can be found in the care plan section) Acute Rehab OT Goals Patient Stated Goal: for God to take her OT Goal Formulation: With patient Time For Goal Achievement: 01/18/13 Potential to Achieve Goals: Good ADL Goals Pt Will Perform Grooming: with supervision;sitting Pt Will Perform Upper Body Bathing: with min assist;sitting Pt Will Perform Upper Body Dressing: with min assist;sitting Pt Will Perform Lower Body Dressing: with min assist;sit to/from stand Pt Will Transfer to Toilet: with min assist;stand pivot transfer;bedside commode Pt Will Perform Toileting - Clothing Manipulation and hygiene: with mod assist;sit to/from stand  Visit Information  Last OT Received On: 01/11/13 Assistance Needed: +1 (transferred only) History of Present Illness: 79 year old female admitted for seizure s/p stroke in August. Was ambulatory with assist and RW in the home, daughter lives with pt since CVA.        Prior Functioning     Home Living Family/patient expects to be discharged to::  Private residence Living Arrangements: Children Available Help at Discharge: Family;Available 24 hours/day Type of Home: House Home Access: Stairs to enter [employer] of Steps: 1 per chart review, pt reports none Entrance Stairs-Rails: None Home Layout: One level Home Equipment: Walker - 4 wheels;Shower seat;Grab bars - tub/shower;Grab bars - toilet;Toilet riser Additional Comments: Dgtr from [employer] has been staying with pt since Aug and states she can stay potentially until April but will have to return home at some point Prior Function Level of Independence: Needs assistance Gait / Transfers Assistance Needed: uses rollator in home, daughter with pt during ambulation. Pt reports she has a wheelchair but does not use it as she tries to walk as much as possible.  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 Rt worse than Lt. Cervical / Trunk Assessment Cervical / Trunk Assessment: Kyphotic     Mobility Bed Mobility Bed Mobility: Supine to Sit;Sit to Supine Supine to Sit: 4: Min assist;With rails;HOB elevated Sit to Supine: With rail;HOB flat;3: Mod assist Details for Bed Mobility Assistance: Min/mod assist to  manage legs. Cues for efficiency.  Transfers Transfers: Sit to Stand;Stand to Sit Sit to Stand: 3: Mod assist;With upper extremity assist;From bed;From chair/3-in-1 Stand to Sit: 3: Mod assist;With upper extremity assist;To bed;To chair/3-in-1     Exercise     Balance Balance Balance Assessed: Yes Static Sitting Balance Static Sitting - Balance Support: Feet supported;No upper extremity supported Static Sitting - Level of Assistance: 5: Stand by assistance   End of Session OT - End of Session Activity Tolerance: Patient limited by fatigue;Patient limited by pain Patient left: in bed;with call bell/phone within reach;with family/visitor present;with nursing/sitter in room Nurse Communication:  (HA--provided ice pack)  GO     Evern Bio 01/11/2013, 4:01 PM [phone]

## 2013-01-11 NOTE — Progress Notes (Signed)
   CARE MANAGEMENT NOTE 01/11/2013  Patient:  [facility]   Account Number:  [id number]  Date Initiated:  01/11/2013  Documentation initiated by:  [facility]  Subjective/Objective Assessment:   adm: Seizures     Action/Plan:   discharge planning   Anticipated DC Date:  01/13/2013   Anticipated DC Plan:  HOME W HOME HEALTH SERVICES      DC Planning Services  CM consult      Choice offered to / List presented to:             Status of service:  In process, will continue to follow Medicare Important Message given?   (If response is "NO", the following Medicare IM given date fields will be blank) Date Medicare IM given:   Date Additional Medicare IM given:    Discharge Disposition:    Per UR Regulation:    If discussed at Long Length of Stay Meetings, dates discussed:    Comments:  01/11/13 16:50 CM received call from MD requesting CM bring family of pt a listing of private duty agencies for [facility].  CM brought listing to daughter of pt.Pt is active with [facility] and will need Resumption orders for discharge.   Will continue to monitor for DC needs.  Freddy Jaksch, BSN, CM [phone].

## 2013-01-11 NOTE — Consult Note (Signed)
Patient [name]      DOB: 08-20-1918      AVW:[id number]     Consult Note from the Palliative Medicine Team at [facility]    Consult Requested by: Dr. Laural Johnson     PCP: Wanda Saupe, MD Reason for Consultation: Goals of care    Phone Number:[phone] Related symptom recommendations Assessment of patients Current state: 79 yr old white female with s/p hospitalization for recent exacerbation of CHF found to need a generator change.  Patient had generator changed on 11/6 was to have follow up with Dr. Johney Johnson with coming Wednesday.  Patient had a witnessed seizure aborted with ativan. Has known seizure disorder.  Keppra was increased.  Patient now awake and alert but fatigued.  Patient guarding left arm.  Spoke with Patient's son and daughter who are having trouble with fully agreeing on a plan of care. [name] sees his mother overall declining and is favoring Hospice care, [name] wants to see if she can rally.  They were able to come together with the following plan.  They want to discharge to home when medically stable to do a trial of home health PT/ OT.  They will observe for the next week and if they see no improvement may consider hospice vs SNF placement.  They both agree the SNF will likely be impending in light of her recent declines.  They agree to high additional service to help [place] care for her at home.   Goals of Care: 1.  Code Status: DNR   2. Scope of Treatment: Currently, family and patient desire general scope of treatment for cardiac and neurologic symptoms.  Looking down the road considering involving hospice care. For now planning to have extra help at home and see how she does.   4. Disposition: Home with home health services- PT, OT, and family   3. Symptom Management:   1. Anxiety/Agitation: none at this time 2. Pain:She does not report pain but is holding her left arm up against her chest 3. Bowel Regimen:BM today 4. Delirium: postictal  clearing 5. Fever: None  4. Psychosocial: Son and daughter are active in her care.  She is a [place]  5. Spiritual: Not discussed this visit family aware that chaplains are available.        Patient Documents Completed or Given: Document Given Completed  Advanced Directives Pkt    MOST    DNR    Gone from My Sight    Hard Choices      Brief HPI: 79 yr old white female with multiple medical problems including tachy/brady syndrome with recent admit for decompensated heart failure.  Asked to assist with re-goals after patient had a seizure.   ROS: patient reports no pain but is guarding her left arm.    PMH:  Past Medical History  Diagnosis Date  . Diabetes mellitus   . Hypertension   . Hyperlipidemia   . Arthritis   . AF (atrial fibrillation)   . Cataract     decreased vision in left eye  . Peripheral vascular disease   . CHF (congestive heart failure)   . Stroke   . Tachycardia-bradycardia syndrome      PSH: Past Surgical History  Procedure Laterality Date  . Pacemaker insertion  08/08/2006; 12/2012    MDT PPM implanted by Dr [name] for tachybrady syndrome and pauses with afib; generator change by Dr [name] 12/2012 - MDT [id number]  . Cholecystectomy    . Thyroid surgery  I have reviewed the FH and SH and  If appropriate update it with new information. Allergies  Allergen Reactions  . Nitrofurantoin Nausea And Vomiting  . Protonix [Pantoprazole Sodium] Nausea Only  . Sulfa Antibiotics Hives  . Trimethoprim Nausea Only   Scheduled Meds: . amiodarone  400 mg Oral Daily  . apixaban  2.5 mg Oral BID  . atorvastatin  20 mg Oral QHS  . furosemide  40 mg Oral BID  . levETIRAcetam  750 mg Oral BID  . metFORMIN  500 mg Oral TID PC  . metoprolol tartrate  25 mg Oral BID  . potassium chloride SA  20 mEq Oral BID  . sertraline  25 mg Oral Daily  . sodium chloride  3 mL Intravenous Q12H   Continuous Infusions:  PRN Meds:.    BP 152/55   Pulse 62  Temp(Src) 97.7 F (36.5 C) (Oral)  Resp 18  Ht 5' 1.81" (1.57 m)  Wt 84.5 kg (186 lb 4.6 oz)  BMI 34.28 kg/m2  SpO2 94%   PPS:40-50%   Intake/Output Summary (Last 24 hours) at 01/11/13 1656 Last data filed at 01/11/13 1333  Gross per 24 hour  Intake    480 ml  Output    600 ml  Net   -120 ml   LBM: 11/15                     Physical Exam:  General: No acute distress very hard of hearing HEENT:  PERRL,EOMI, speech is clear Chest:   Decreased but clear, S1, S2,  CVS: regular rate and rhythm, bandage over left arm s/p generator change Abdomen: obese soft, no grimace Ext: guarding her left arm Neuro: oriented to herself, and her children  Labs: CBC    Component Value Date/Time   WBC 9.4 01/10/2013 0501   RBC 3.86* 01/10/2013 0501   HGB 12.1 01/10/2013 0501   HCT 36.1 01/10/2013 0501   PLT 283 01/10/2013 0501   MCV 93.5 01/10/2013 0501   MCH 31.3 01/10/2013 0501   MCHC 33.5 01/10/2013 0501   RDW 15.1 01/10/2013 0501   LYMPHSABS 4.7* 01/09/2013 1943   MONOABS 1.4* 01/09/2013 1943   EOSABS 0.2 01/09/2013 1943   BASOSABS 0.1 01/09/2013 1943       CMP     Component Value Date/Time   NA 143 01/10/2013 0501   K 3.6 01/10/2013 0501   CL 101 01/10/2013 0501   CO2 32 01/10/2013 0501   GLUCOSE 116* 01/10/2013 0501   BUN 14 01/10/2013 0501   CREATININE 0.87 01/10/2013 0501   CALCIUM 7.4* 01/10/2013 0501   PROT 6.2 01/09/2013 1943   ALBUMIN 3.0* 01/09/2013 1943   AST 32 01/09/2013 1943   ALT 25 01/09/2013 1943   ALKPHOS 78 01/09/2013 1943   BILITOT 0.6 01/09/2013 1943   GFRNONAA 57* 01/10/2013 0501   GFRAA 65* 01/10/2013 0501     CT scan of the Head Reviewed/Impressions: 1. No acute abnormality.  2. Old right posterior watershed distribution infarct.  3. Stable atrophy and chronic small vessel white matter ischemic  changes  Discussed with Dr. Laural Johnson, and Dr. Donnetta Simpers from cardiology   Time In Time Out Total Time Spent with Patient Total  Overall Time  400 pm 445 pm 15 min 45 min    Greater than 50%  of this time was spent counseling and coordinating care related to the above assessment and plan.   Wanda Gingras L. Ladona Ridgel, MD MBA The Palliative Medicine  Team at [facility] Team Phone: [phone] Pager: [phone]

## 2013-01-12 DIAGNOSIS — I495 Sick sinus syndrome: Secondary | ICD-10-CM

## 2013-01-12 DIAGNOSIS — Z515 Encounter for palliative care: Secondary | ICD-10-CM

## 2013-01-12 DIAGNOSIS — R569 Unspecified convulsions: Secondary | ICD-10-CM

## 2013-01-12 DIAGNOSIS — T82111A Breakdown (mechanical) of cardiac pulse generator (battery), initial encounter: Secondary | ICD-10-CM

## 2013-01-12 DIAGNOSIS — I5033 Acute on chronic diastolic (congestive) heart failure: Secondary | ICD-10-CM

## 2013-01-12 DIAGNOSIS — J9600 Acute respiratory failure, unspecified whether with hypoxia or hypercapnia: Secondary | ICD-10-CM

## 2013-01-12 DIAGNOSIS — G40401 Other generalized epilepsy and epileptic syndromes, not intractable, with status epilepticus: Principal | ICD-10-CM

## 2013-01-12 LAB — BASIC METABOLIC PANEL
BUN: 13 mg/dL (ref 6–23)
Calcium: 8.2 mg/dL — ABNORMAL LOW (ref 8.4–10.5)
Creatinine, Ser: 0.92 mg/dL (ref 0.50–1.10)
GFR calc Af Amer: 61 mL/min — ABNORMAL LOW (ref >90)
GFR calc non Af Amer: 53 mL/min — ABNORMAL LOW (ref >90)
Glucose, Bld: 138 mg/dL — ABNORMAL HIGH (ref 70–99)
Potassium: 4.3 mEq/L (ref 3.5–5.1)

## 2013-01-12 LAB — GLUCOSE, CAPILLARY
Glucose-Capillary: 93 mg/dL (ref 70–99)
Glucose-Capillary: 99 mg/dL (ref 70–99)

## 2013-01-12 LAB — MAGNESIUM: Magnesium: 1.3 mg/dL — ABNORMAL LOW (ref 1.5–2.5)

## 2013-01-12 MED ORDER — DIVALPROEX SODIUM 250 MG PO DR TAB
250.0000 mg | DELAYED_RELEASE_TABLET | Freq: Two times a day (BID) | ORAL | Status: DC
  Administered 2013-01-12 – 2013-01-14 (×5): 250 mg via ORAL
  Filled 2013-01-12 (×7): qty 1

## 2013-01-12 MED ORDER — LEVETIRACETAM 500 MG PO TABS
500.0000 mg | ORAL_TABLET | Freq: Once | ORAL | Status: DC
  Filled 2013-01-12: qty 1

## 2013-01-12 MED ORDER — MAGNESIUM SULFATE 40 MG/ML IJ SOLN
2.0000 g | Freq: Once | INTRAMUSCULAR | Status: AC
  Administered 2013-01-12: 2 g via INTRAVENOUS
  Filled 2013-01-12: qty 50

## 2013-01-12 MED ORDER — SODIUM CHLORIDE 0.9 % IV SOLN
500.0000 mg | Freq: Once | INTRAVENOUS | Status: AC
  Administered 2013-01-12: 500 mg via INTRAVENOUS
  Filled 2013-01-12: qty 5

## 2013-01-12 MED ORDER — LORAZEPAM 2 MG/ML IJ SOLN
INTRAMUSCULAR | Status: AC
  Filled 2013-01-12: qty 1

## 2013-01-12 MED ORDER — LORAZEPAM 2 MG/ML IJ SOLN
1.0000 mg | Freq: Once | INTRAMUSCULAR | Status: AC
  Administered 2013-01-12: 1 mg via INTRAVENOUS

## 2013-01-12 MED ORDER — INSULIN ASPART 100 UNIT/ML ~~LOC~~ SOLN
0.0000 [IU] | Freq: Three times a day (TID) | SUBCUTANEOUS | Status: DC
  Administered 2013-01-13: 2 [IU] via SUBCUTANEOUS
  Administered 2013-01-14: 1 [IU] via SUBCUTANEOUS

## 2013-01-12 MED ORDER — POTASSIUM CHLORIDE 10 MEQ/100ML IV SOLN
10.0000 meq | INTRAVENOUS | Status: AC
  Administered 2013-01-12 (×4): 10 meq via INTRAVENOUS
  Filled 2013-01-12 (×4): qty 100

## 2013-01-12 MED ORDER — LEVETIRACETAM 500 MG PO TABS
1000.0000 mg | ORAL_TABLET | Freq: Two times a day (BID) | ORAL | Status: DC
  Administered 2013-01-12 – 2013-01-14 (×5): 1000 mg via ORAL
  Filled 2013-01-12 (×6): qty 2

## 2013-01-12 MED ORDER — VALPROATE SODIUM 500 MG/5ML IV SOLN
1750.0000 mg | Freq: Once | INTRAVENOUS | Status: AC
Start: 2013-01-12 — End: 2013-01-12
  Administered 2013-01-12: 1750 mg via INTRAVENOUS
  Filled 2013-01-12: qty 17.5

## 2013-01-12 NOTE — Progress Notes (Signed)
Page gotten from central monitor that pt's HR going up to 135 - 140 on monitor. Pt was found shaking her body while sleeping, when pt wake up refers felling like her hard was trying to get out of her chest. MD notified and rapid respond called. Cardiologist refers that pt was having some focus seizures and Ativan 1 mg IV given per MD order.

## 2013-01-12 NOTE — Progress Notes (Signed)
Subjective: Patient experienced a recurrence of status epilepticus last night. She was given Ativan and subsequently Keppra was increased and Depacon 1000 mg was given IV. His been no further seizure activity. Patient has been alert and in no complaints this morning.  Objective: Current vital signs: BP 175/72  Pulse 60  Temp(Src) 99.1 F (37.3 C) (Oral)  Resp 18  Ht 5' 1.81" (1.57 m)  Wt 84.5 kg (186 lb 4.6 oz)  BMI 34.28 kg/m2  SpO2 99%  Neurologic Exam: Alert and in no acute distress. Patient is well-oriented to time as well as place. Speech was normal. Face was symmetrical with no signs of focal weakness. Patient moved extremities equally with normal strength throughout. Coordination of upper extremities was normal.  Medications: I have reviewed the patient's current medications.  Assessment/Plan: Breakthrough status epilepticus, resolved. Patient appears to be tolerating current regimen of Keppra and Depakote. I will hold on scheduled treatment with Depakote, as increasing Keppra may be adequate for seizure control.  I am making no changes in her current management and we'll continue to manage over the next 24 hours. If further seizure activity occurs will consider adding Depakote on a scheduled basis, or starting Vimpat.  C.R. Roseanne Reno, MD [facility] [phone]  01/12/2013  9:24 AM

## 2013-01-12 NOTE — Progress Notes (Addendum)
[facility] progress note. Chief complaint. Left-sided body shaking. This 79 year old female in hospital for recurrent focal seizure. Patient also has a history of sick sinus syndrome recent PPM generator change 01/02/13. I was called to the patient's bedside she was noted to have rapid jerking motions of the left upper and lower extremity and left face and eyelid. Patient's heart rate recorded in the 140-150 range per telemetry though cardiologist Dr. Don Broach  felt this was probably artifact. A 12-lead EKG was obtained and was essentially unreadable because of artifact. Cardiology felt there may be some underlying pacemaker function issue and recommended a repeat EKG when focal seizure resolved. I contacted [facility] neurology and administered 2 mg of IV Ativan with no response to what was felt to be a focal seizure. Doctor Amada Jupiter requested I order IV Keppra 500 mg bolus and increase routine Keppra to 1000 mg by mouth twice daily. The patient improved with IV Keppra but Doctor Amada Jupiter noted the patient continued to have some facial twitching and recommended loading with valproic acid 20 mg per kilogram x1 dose. A 12-lead EKG was obtained indicating prolonged QTC equaling 556 ms. We reviewed recent labs indicating potassium yesterday morning 3.6 and magnesium on 12/28/12 0.7 and per my discussion with cardiology will administer potassium with 40 mEq IV to a goal potassium of greater than 4 and administer magnesium sulfate 2 g IV to a goal magnesium of greater than 2. Vital signs. Temperature 90.8, pulse 63, respiration 18, blood pressure 140/80. O2 sats 97%. General appearance. Alert with jerking movements of the left upper and lower extremities also involving the left face and eyelid. Patient appears oriented and responds appropriately to questions though is very hard of hearing. Neurologic. Cranial nerves 2-12 grossly intact. No unilateral or focal defects. Impression/plan. Problem #1.  Focal seizures. In summary the patient was administered 2 mg of IV Ativan without seizure resolution. Per my discussion with Doctor Amada Jupiter we have given a 500 mg Keppra bolus and increased baseline Keppra to 1000 mg twice daily. Unfortunately the Keppra bolus did not completely resolve left facial twitching and I have ordered valproic acid load with 20 mg per kilogram IV dose per Doctor Kirkpatrick's request. Problem #2. Prolonged QT/QTC. Her 12-lead EKG atrial paced rhythm with a rate of 62. QT 548/QTC 556 ms. As per my discussion with Doctor Don Broach will replace potassium with 40 mEq IV and magnesium sulfate 2 g IV. Will repeat an EKG in about 6 hours and check a basic metabolic panel and serum magnesium level at the same time. These will need to be followed up when results are available. We have also arranged for [employer] representative to interrogate the pacemaker later this morning.

## 2013-01-12 NOTE — Progress Notes (Addendum)
[facility] PROGRESS NOTE  [name]:[id number] DOB: 09-25-1918 DOA: 01/09/2013 PCP: Wanda Saupe, MD  Assessment/Plan: 1. Recurrent Seizure - pt had recurrence of focal seizure overnight and neuro loaded with IV keppra, increased dose to 1000 mg po BID and loaded with valproic acid.  2. Hypokalemia - replaced 3. Prolonged QT - replacing electrolytes magnesium and potassium and repeat EKG and lytes, cardiology following.  4. CHF - continue cardiac medications 5. SSS, Atrial Fibrillation - continue eliquis, amio, metoprolol, [employer] rep called to interrogate PPM this morning, appreciate cardiology assistance.  6. DM type 2 - will hold metformin for now, monitor BS and provide supplemental insulin as needed.  7. Physical Deconditioning - Family met with palliative medicine team and decided that they would like to take patient home with personal care assistance and with HHPT.     Code Status: DNR/DNI  Family Communication: son and daughter at bedside  Dispo: Family requesting palliative medicine consult. They are undecided about taking pt home with HHPT vs SNF.    HPI/Subjective: Pt had recurrence of seizure overnight (Please see chart documentation of events).  It has since resolved and pt is awake, alert and speaking this morning.  She denies any complaints.   Objective: Filed Vitals:   01/12/13 0500  BP: 175/72  Pulse: 60  Temp: 99.1 F (37.3 C)  Resp: 18    Intake/Output Summary (Last 24 hours) at 01/12/13 2952 Last data filed at 01/12/13 0631  Gross per 24 hour  Intake  885.5 ml  Output   1000 ml  Net -114.5 ml   Filed Weights   01/09/13 2205 01/11/13 0500 01/11/13 0700  Weight: 172 lb 6.4 oz (78.2 kg) 186 lb 3.2 oz (84.46 kg) 186 lb 4.6 oz (84.5 kg)   Exam:  General: NAD, resting comfortably, awake, alert  Eyes: PEERLA EOMI  ENT: mucous membranes moist  Neck: supple w/o JVD  Cardiovascular: RRR w/o MRG  Respiratory: CTA B  Abdomen: soft, nt,  nd, bs+  Skin: no rash nor lesion  Musculoskeletal: MAE, full ROM all 4 extremities  Psychiatric: normal affect  Neurologic: Awake, alert, R arm 4/5 strength, L arm 2/5 strength   Data Reviewed: Basic Metabolic Panel:  Recent Labs Lab 01/09/13 1902 01/09/13 1943 01/10/13 0501  NA 140 138 143  K 2.7* 3.2* 3.6  CL 96 95* 101  CO2  --  27 32  GLUCOSE 173* 168* 116*  BUN 14 14 14   CREATININE 1.10 0.92 0.87  CALCIUM  --  7.9* 7.4*   Liver Function Tests:  Recent Labs Lab 01/09/13 1943  AST 32  ALT 25  ALKPHOS 78  BILITOT 0.6  PROT 6.2  ALBUMIN 3.0*   No results found for this basename: LIPASE, AMYLASE,  in the last 168 hours No results found for this basename: AMMONIA,  in the last 168 hours CBC:  Recent Labs Lab 01/09/13 1902 01/09/13 1943 01/10/13 0501  WBC  --  12.2* 9.4  NEUTROABS  --  5.8  --   HGB 14.6 13.7 12.1  HCT 43.0 40.8 36.1  MCV  --  95.6 93.5  PLT  --  342 283   Cardiac Enzymes: No results found for this basename: CKTOTAL, CKMB, CKMBINDEX, TROPONINI,  in the last 168 hours BNP (last 3 results)  Recent Labs  10/29/12 0933 10/30/12 0611 12/27/12 1132  PROBNP 4185.0* 5109.0* 3125.0*   CBG:  Recent Labs Lab 01/11/13 0740 01/11/13 1204 01/11/13 1649 01/11/13 2116 01/12/13 8413  GLUCAP 102* 122* 151* 114* 122*    Recent Results (from the past 240 hour(s))  SURGICAL PCR SCREEN     Status: None   Collection Time    01/02/13  1:57 PM      Result Value Range Status   MRSA, PCR NEGATIVE  NEGATIVE Final   Staphylococcus aureus NEGATIVE  NEGATIVE Final   Comment:            The Xpert SA Assay (FDA     approved for NASAL specimens     in patients over 29 years of age),     is one component of     a comprehensive surveillance     program.  Test performance has     been validated by [employer] for patients greater     than or equal to 68 year old.     It is not intended     to diagnose infection nor to     guide or monitor  treatment.     Studies: No results found.  Scheduled Meds: . amiodarone  400 mg Oral Daily  . apixaban  2.5 mg Oral BID  . atorvastatin  20 mg Oral QHS  . divalproex  250 mg Oral Q12H  . furosemide  40 mg Oral BID  . levETIRAcetam  1,000 mg Oral BID  . levETIRAcetam  500 mg Oral Once  . LORazepam      . LORazepam      . LORazepam      . metFORMIN  500 mg Oral TID PC  . metoprolol tartrate  25 mg Oral BID  . potassium chloride SA  20 mEq Oral BID  . sertraline  25 mg Oral Daily  . sodium chloride  3 mL Intravenous Q12H   Continuous Infusions:   Principal Problem:   Seizure Active Problems:   Pacemaker malfunction   [name] [facility]  [facility] Pager [phone]. If 7PM-7AM, please contact night-coverage at [email or web address], password [facility] 01/12/2013, 8:22 AM  LOS: 3 days

## 2013-01-12 NOTE — Progress Notes (Signed)
Pt noticed to have more weakness on her left side of the body, Neurology notified, order to continue monitor for 24 hrs after seizure gotten since pt was having weakness on her left side on admission.

## 2013-01-12 NOTE — Progress Notes (Signed)
Patient WU:[id number] Gaymon      DOB: [birth year]      JYN:[id number]   Palliative Medicine Team at [facility] Progress Note    Subjective: Patient was just getting to sleep. Reviewed notes from last evening noted seizure activity. Spoke with patient's daughter. They now may be leaning toward skilled nursing facility placement once medically stable. Will not disturb patient at this time as there is no intervention necessary from palliative at the moment.    Filed Vitals:   01/12/13 1500  BP: 126/42  Pulse: 59  Temp: 98.3 F (36.8 C)  Resp: 16   Physical exam: Deferred     Assessment and plan: 79 year old white female with a known past medical history for heart failure status post generator replacement last week. Patient was admitted with acute on chronic seizure disorder. She had a subsequent seizure again last night it was focal in nature. Her medications are being titrated. The patient's daughter and son were initially thinking of taking her home with additional help but at this time her considering skilled nursing facility placement for brief period of time.   1. DO NOT RESUSCITATE DO NOT INTUBATE. Family still undermined to treat the treatable and were not considering hospice at this time  2. Seizure disorder continue medications per neurology  3. Tachybradycardia syndrome with pacemaker recently changed generator. Appreciated cardiology input   Total time 15 minutes  Batu Cassin L. Ladona Ridgel, MD MBA The Palliative Medicine Team at [facility] Phone: [phone] Pager: [phone]

## 2013-01-12 NOTE — Progress Notes (Signed)
     Subjective:  Patient is awake but lethargic. Had recurrent seizure activity during the night. Pacemaker interrogation by [employer] rep is pending. EKG at 0238 today showed atrial paced rhythm and prolonged QTc interval. Potassium and magnesium being repleted.  Objective:  Vital Signs in the last 24 hours: Temp:  [97.6 F (36.4 C)-99.1 F (37.3 C)] 99.1 F (37.3 C) (11/16 0500) Pulse Rate:  [59-66] 66 (11/16 1016) Resp:  [18] 18 (11/16 0500) BP: (118-194)/(43-80) 118/43 mmHg (11/16 1016) SpO2:  [94 %-99 %] 99 % (11/16 0500)  Intake/Output from previous day: 11/15 0701 - 11/16 0700 In: 885.5 [P.O.:818; IV Piggyback:67.5] Out: 1000 [Urine:1000] Intake/Output from this shift:    . amiodarone  400 mg Oral Daily  . apixaban  2.5 mg Oral BID  . atorvastatin  20 mg Oral QHS  . divalproex  250 mg Oral Q12H  . furosemide  40 mg Oral BID  . insulin aspart  0-9 Units Subcutaneous TID WC  . levETIRAcetam  1,000 mg Oral BID  . levETIRAcetam  500 mg Oral Once  . LORazepam      . LORazepam      . LORazepam      . metoprolol tartrate  25 mg Oral BID  . potassium chloride SA  20 mEq Oral BID  . sertraline  25 mg Oral Daily  . sodium chloride  3 mL Intravenous Q12H      Physical Exam: The patient appears to be in no distress.  Head and neck exam reveals that the pupils are equal and reactive.  The extraocular movements are full.  There is no scleral icterus.  Mouth and pharynx are benign.  No lymphadenopathy.  No carotid bruits.  The jugular venous pressure is normal.  Thyroid is not enlarged or tender.  Chest is clear to percussion and auscultation.  No rales or rhonchi.  Expansion of the chest is symmetrical. Recent pacemaker generator change dressing intact.  Heart reveals no abnormal lift or heave.  First and second heart sounds are normal.  There is no murmur gallop rub or click.   Lab Results:  Recent Labs  01/09/13 1943 01/10/13 0501  WBC 12.2* 9.4  HGB 13.7 12.1    PLT 342 283    Recent Labs  01/10/13 0501 01/12/13 0918  NA 143 138  K 3.6 4.3  CL 101 94*  CO2 32 27  GLUCOSE 116* 138*  BUN 14 13  CREATININE 0.87 0.92   No results found for this basename: TROPONINI, CK, MB,  in the last 72 hours Hepatic Function Panel  Recent Labs  01/09/13 1943  PROT 6.2  ALBUMIN 3.0*  AST 32  ALT 25  ALKPHOS 78  BILITOT 0.6   No results found for this basename: CHOL,  in the last 72 hours No results found for this basename: PROTIME,  in the last 72 hours  Imaging: No results found.  Cardiac Studies: Telemetry shows atrial paced rhythm Assessment/Plan:  1. Sick sinus syndrome with recent generator change 01/02/13 by Dr. Johney Frame. 2. History of PAF 3. Seizure disorder.  Plan: continue amiodarone, apixaban, metoprolol. Await pacemaker interrogation.  LOS: 3 days    [name] 01/12/2013, 10:29 AM

## 2013-01-12 NOTE — Progress Notes (Signed)
[employer] notified of pt needing pacemaker interrogation.  Verbal ok from MD to have checked in am.  Per 1 800 call center, page to local rep, Feliz Beam will be sent at 0600.

## 2013-01-13 DIAGNOSIS — E785 Hyperlipidemia, unspecified: Secondary | ICD-10-CM

## 2013-01-13 LAB — GLUCOSE, CAPILLARY
Glucose-Capillary: 94 mg/dL (ref 70–99)
Glucose-Capillary: 138 mg/dL — ABNORMAL HIGH (ref 70–99)
Glucose-Capillary: 115 mg/dL — ABNORMAL HIGH (ref 70–99)

## 2013-01-13 LAB — BASIC METABOLIC PANEL
CO2: 31 mEq/L (ref 19–32)
Calcium: 8.4 mg/dL (ref 8.4–10.5)
Chloride: 97 mEq/L (ref 96–112)
GFR calc non Af Amer: 61 mL/min — ABNORMAL LOW (ref >90)
Glucose, Bld: 102 mg/dL — ABNORMAL HIGH (ref 70–99)
Potassium: 3.6 mEq/L (ref 3.5–5.1)
Sodium: 140 mEq/L (ref 135–145)

## 2013-01-13 LAB — MAGNESIUM: Magnesium: 1.8 mg/dL (ref 1.5–2.5)

## 2013-01-13 MED ORDER — MAGNESIUM SULFATE IN D5W 10-5 MG/ML-% IV SOLN
1.0000 g | Freq: Once | INTRAVENOUS | Status: AC
  Administered 2013-01-13: 1 g via INTRAVENOUS
  Filled 2013-01-13: qty 100

## 2013-01-13 NOTE — Progress Notes (Signed)
Occupational Therapy Treatment Patient Details Name: [name] MRN: [id number] DOB: 06/26/1918 Today's Date: 01/13/2013 Time: 4098-1191 OT Time Calculation (min): 45 min  OT Assessment / Plan / Recommendation  History of present illness 79 year old female admitted for seizure s/p stroke in August. Was ambulatory with assist and RW in the home, daughter lives with pt since CVA.    OT comments  Pt with more pronounced balance deficits, LE weakness, and inattention to the L visual field and L UE.  Fell asleep immediately upon completion of activity.  Encouraged daughter to consider SNF for rehab, pt's brother is reluctant.    Follow Up Recommendations  SNF    Barriers to Discharge       Equipment Recommendations  None recommended by OT    Recommendations for Other Services    Frequency Min 2X/week   Progress towards OT Goals Progress towards OT goals: Not progressing toward goals - comment (pt with a second seizure since OT eval)  Plan Discharge plan remains appropriate    Precautions / Restrictions Precautions Precautions: Fall Precaution Comments: L inattention Restrictions Weight Bearing Restrictions: No   Pertinent Vitals/Pain VSS, on 2L 02    ADL  Eating/Feeding: Moderate assistance Where Assessed - Eating/Feeding: Bed level Grooming: Wash/dry hands;Wash/dry face;Minimal assistance Where Assessed - Grooming: Unsupported sitting Equipment Used: Gait belt ADL Comments: Pt with decreased activity tolerance. Limited activity to EOB and standing x 2.    OT Diagnosis:    OT Problem List:   OT Treatment Interventions:     OT Goals(current goals can now be found in the care plan section)    Visit Information  Last OT Received On: 01/13/13 Assistance Needed: +2 History of Present Illness: 79 year old female admitted for seizure s/p stroke in August. Was ambulatory with assist and RW in the home, daughter lives with pt since CVA.     Subjective Data      Prior  Functioning       Cognition  Cognition Arousal/Alertness: Awake/alert Behavior During Therapy: WFL for tasks assessed/performed Overall Cognitive Status: Difficult to assess Difficult to assess due to: Hard of hearing/deaf    Mobility  Bed Mobility Bed Mobility: Rolling Right;Right Sidelying to Sit;Sitting - Scoot to [place] of Bed;Sit to Supine;Scooting to [facility] Rolling Right: 4: Min assist Right Sidelying to Sit: 3: Mod assist;HOB elevated;With rails Sitting - Scoot to Edge of Bed: 2: Max assist Sit to Supine: 3: Mod assist;HOB flat Scooting to HOB: 1: +2 Total assist Scooting to [facility]: Patient Percentage: 0% Transfers Transfers: Sit to Stand;Stand to Sit Sit to Stand: 2: Max assist;With upper extremity assist;From bed Stand to Sit: 2: Max assist;With upper extremity assist;To bed Details for Transfer Assistance: stood x 2, second time she remained for 20 seconds with mod assist and knees blocked to prevent buckling    Exercises      Balance Balance Balance Assessed: Yes Static Sitting Balance Static Sitting - Balance Support: Feet supported;No upper extremity supported Static Sitting - Level of Assistance: 5: Stand by assistance Dynamic Sitting Balance Dynamic Sitting - Balance Support: Feet supported Dynamic Sitting - Level of Assistance: 4: Min assist   End of Session OT - End of Session Equipment Utilized During Treatment: Gait belt Activity Tolerance: Patient limited by fatigue Patient left: in bed;with call bell/phone within reach;with family/visitor present Nurse Communication:  (pt need bath)  GO     Evern Bio 01/13/2013, 11:03 AM [phone]

## 2013-01-13 NOTE — Progress Notes (Signed)
Subjective: No recurrence of seizure activity over the past 48-60 hours. She's tolerating Keppra 1000 mg twice a day with no side effects.  Objective: Current vital signs: BP 139/46  Pulse 60  Temp(Src) 97.9 F (36.6 C) (Oral)  Resp 18  Ht 5' 1.81" (1.57 m)  Wt 84.5 kg (186 lb 4.6 oz)  BMI 34.28 kg/m2  SpO2 98%  Neurologic Exam: Alert and in no acute distress. Patient is well-oriented to time as well as place. Speech was normal. Patient moved extremities equally with symmetrical strength.  Medications: I have reviewed the patient's current medications.  Assessment/Plan: Seizure disorder, controlled with current dose of Keppra.  No changes in current management recommended. She is to continue Keppra 1000 mg twice a day. She'll need outpatient neurology followup after discharge from this admission. I will sign off on her care at this point but remain available for reevaluation if clinically indicated.  C.R. Roseanne Reno, MD [facility] [phone]  01/13/2013  9:46 AM

## 2013-01-13 NOTE — Progress Notes (Signed)
[facility]  Patient Status: Active (receiving services up to time of hospitalization)  [facility] is providing the following services: RN and PT  If patient discharges after hours, please call [phone].   [name] 01/13/2013, 11:55 AM

## 2013-01-13 NOTE — Progress Notes (Addendum)
Physical Therapy Treatment Patient Details Name: [name] MRN: [id number] DOB: 05-May-1918 Today's Date: 01/13/2013 Time: 1478-2956 PT Time Calculation (min): 35 min  PT Assessment / Plan / Recommendation  History of Present Illness 79 year old female admitted for seizure s/p stroke in August. Was ambulatory with assist and RW in the home, daughter lives with pt since CVA.  Pt had a second seizure during her hospital stay which has left her with increased L upper extremity weakness/discoordination and decreased left lower extremity strength as well.     PT Comments   Pt with second seizure since PT saw her last with increased left sided weakness and decreased coordination (especially in her upper extremity) making it difficult to grip the RW to stand or take steps to the chair.  Daughter present (who is her primary caregiver) to see how her mother is doing to help determine if she thinks she can manage the pt's physical mobility at home.  Daughter is now wanting to pursue SNF for rehab.    Follow Up Recommendations  SNF     Does the patient have the potential to tolerate intense rehabilitation    NA  Barriers to Discharge   None      Equipment Recommendations  None recommended by PT    Recommendations for Other Services   None  Frequency Min 3X/week   Progress towards PT Goals Progress towards PT goals: Not progressing toward goals - comment;Goals downgraded-see care plan (pt has had another seizure which left her with L weakness)  Plan Current plan remains appropriate    Precautions / Restrictions Precautions Precautions: Fall Precaution Comments: L inattention, L sided weakness and decreased coordination.    Pertinent Vitals/Pain See vitals flow sheet.     Mobility  Bed Mobility Rolling Right: 4: Min assist;With rail Right Sidelying to Sit: 3: Mod assist;With rails;HOB flat Sitting - Scoot to Edge of Bed: 4: Min assist;With rail Details for Bed Mobility Assistance:  mod assist to support trunk during transitions, hand over hand assist needed for left hand placement on railing.  Transfers Transfers: Sit to Stand;Stand to Sit;Stand Pivot Transfers Sit to Stand: 1: +2 Total assist;With upper extremity assist;From bed Sit to Stand: Patient Percentage: 60% Stand to Sit: 1: +2 Total assist;To chair/3-in-1 Stand to Sit: Patient Percentage: 40% Details for Transfer Assistance: stood x 2 for practice of hand placement and in preparation to transfer to recliner chair.  The pt needed assist to get to standing at her trunk due to weakness in her left leg and arm (per daughter she usually pushes up with both hands to get to standing).  Manual assist needed for left hand placement on RW handle and to maintain this contact during the transfer.  Left knee buckling with pivotal steps to the recliner chair.  Ambulation/Gait Ambulation/Gait Assistance: Not tested (comment) (pt not ready yet. )      PT Goals (current goals can now be found in the care plan section) Acute Rehab PT Goals Patient Stated Goal: to decrease the burden on her daughter PT Goal Formulation: With patient/family Time For Goal Achievement: 01/27/13 Potential to Achieve Goals: Fair  Visit Information  Last PT Received On: 01/13/13 Assistance Needed: +2 History of Present Illness: 79 year old female admitted for seizure s/p stroke in August. Was ambulatory with assist and RW in the home, daughter lives with pt since CVA.  Pt had a second seizure during her hospital stay which has left her with increased L upper extremity  weakness/discoordination and decreased left lower extremity strength as well.      Subjective Data  Subjective: Pt feels she is a burden on her daughter. Daughter wants to see her move to see if she can handle her physically at home.  Patient Stated Goal: to decrease the burden on her daughter   Cognition  Cognition Arousal/Alertness: Awake/alert Behavior During Therapy: WFL for  tasks assessed/performed Overall Cognitive Status: Within Functional Limits for tasks assessed Difficult to assess due to: Hard of hearing/deaf (Pt is HOH left ear is better than right ear. )    Balance  Static Sitting Balance Static Sitting - Balance Support: Bilateral upper extremity supported;Feet supported (left hand manually placed on bed. ) Static Sitting - Level of Assistance: 4: Min assist Static Standing Balance Static Standing - Balance Support: Bilateral upper extremity supported Static Standing - Comment/# of Minutes: total assist +2 pt 60% with support at trunk and cues to weight shift to the right (she was leaning toward her weaker left side).    End of Session PT - End of Session Equipment Utilized During Treatment: Gait belt Activity Tolerance: Patient limited by fatigue Patient left: in chair;with call bell/phone within reach;with nursing/sitter in room (RN tech called to change bed linens) Nurse Communication: Other (comment) (left O2 off)    Lurena Joiner B. Kellin Fifer, PT, DPT [phone]   01/13/2013, 3:55 PM

## 2013-01-13 NOTE — Care Management Note (Unsigned)
    Page 1 of 2   01/13/2013     3:34:02 PM   CARE MANAGEMENT NOTE 01/13/2013  Patient:  [facility]   Account Number:  [id number]  Date Initiated:  01/11/2013  Documentation initiated by:  [facility]  Subjective/Objective Assessment:   adm: Seizures     Action/Plan:   discharge planning   Anticipated DC Date:  01/13/2013   Anticipated DC Plan:  HOME W HOME HEALTH SERVICES      DC Planning Services  CM consult      Choice offered to / List presented to:          [facility] arranged  HH-1 RN  HH-10 DISEASE MANAGEMENT  HH-2 PT      [facility] agency  [facility]   Status of service:  In process, will continue to follow Medicare Important Message given?   (If response is "NO", the following Medicare IM given date fields will be blank) Date Medicare IM given:   Date Additional Medicare IM given:    Discharge Disposition:    Per UR Regulation:  Reviewed for med. necessity/level of care/duration of stay  If discussed at Long Length of Stay Meetings, dates discussed:   01/14/2013    Comments:  01-13-13 Tomi Bamberger, RN,BSN [phone] Pt is active with [facility]. Therapy is recommending SNF. Daughter/ pt is agreeable to SNF placement. CM will continue to monitor for disposition needs.   01/11/13 16:50 CM received call from MD requesting CM bring family of pt a listing of private duty agencies for [facility].  CM brought listing to daughter of pt.Pt is active with [facility] and will need Resumption orders for discharge.   Will continue to monitor for DC needs.  Freddy Jaksch, BSN, CM [phone].

## 2013-01-13 NOTE — Progress Notes (Addendum)
[facility] PROGRESS NOTE  [name] JXB:[id number] DOB: 1918/12/20 DOA: 01/09/2013 PCP: [name], MD  Assessment/Plan: 1. Recurrent Seizure - no further seizure reported, keppra dose increased to 1000 mg po BID, appreciate neurology assistance, currently being monitored. Seizure precautions.    2. Hypokalemia - repleted.  3. Hypomagnesium - slowly improving, cardiology recommended Mg >2.  Now at 1.8.  Will give an additional 1 gm  4. Prolonged QT - replacing electrolytes magnesium and potassium.  cardiology following.  5. CHF - stable, compensated, continue cardiac medications 6. SSS, Atrial Fibrillation - continue eliquis, amio, metoprolol, [employer] rep has reported PPM is functioning adequately.  appreciate cardiology assistance.  7. DM type 2 - will hold metformin for now, BS well controlled, monitor BS and provide supplemental insulin as needed.  8. Physical Deconditioning - Family met with palliative medicine team and decided that they would like to take patient home with personal care assistance and with HHPT.   Code Status: DNR/DNI  Family Communication: son and daughter at bedside  Dispo: Family unsure about disposition, will ask CSW to meet with family about options  HPI/Subjective: Pt without complaints today. She is alert and oriented x 3  Objective: Filed Vitals:   01/13/13 0500  BP: 139/46  Pulse: 60  Temp: 97.9 F (36.6 C)  Resp: 18    Intake/Output Summary (Last 24 hours) at 01/13/13 1308 Last data filed at 01/12/13 2256  Gross per 24 hour  Intake    360 ml  Output    801 ml  Net   -441 ml   Filed Weights   01/09/13 2205 01/11/13 0500 01/11/13 0700  Weight: 172 lb 6.4 oz (78.2 kg) 186 lb 3.2 oz (84.46 kg) 186 lb 4.6 oz (84.5 kg)    Exam:  General: NAD, resting comfortably, awake, alert  Eyes: PEERLA EOMI  ENT: mucous membranes moist  Neck: supple w/o JVD  Cardiovascular: RRR w/o MRG  Respiratory: CTA B  Abdomen: soft, nt, nd, bs+   Skin: no rash nor lesion  Musculoskeletal: MAE, full ROM all 4 extremities  Psychiatric: normal affect  Neurologic: Awake, alert, R arm 4/5 strength, L arm 2/5 strength  Data Reviewed: Basic Metabolic Panel:  Recent Labs Lab 01/09/13 1902 01/09/13 1943 01/10/13 0501 01/12/13 0918 01/13/13 0455  NA 140 138 143 138 140  K 2.7* 3.2* 3.6 4.3 3.6  CL 96 95* 101 94* 97  CO2  --  27 32 27 31  GLUCOSE 173* 168* 116* 138* 102*  BUN 14 14 14 13 14   CREATININE 1.10 0.92 0.87 0.92 0.82  CALCIUM  --  7.9* 7.4* 8.2* 8.4  MG  --   --   --  1.3* 1.8   Liver Function Tests:  Recent Labs Lab 01/09/13 1943  AST 32  ALT 25  ALKPHOS 78  BILITOT 0.6  PROT 6.2  ALBUMIN 3.0*   No results found for this basename: LIPASE, AMYLASE,  in the last 168 hours No results found for this basename: AMMONIA,  in the last 168 hours CBC:  Recent Labs Lab 01/09/13 1902 01/09/13 1943 01/10/13 0501  WBC  --  12.2* 9.4  NEUTROABS  --  5.8  --   HGB 14.6 13.7 12.1  HCT 43.0 40.8 36.1  MCV  --  95.6 93.5  PLT  --  342 283   Cardiac Enzymes: No results found for this basename: CKTOTAL, CKMB, CKMBINDEX, TROPONINI,  in the last 168 hours BNP (last 3 results)  Recent  Labs  10/29/12 0933 10/30/12 0611 12/27/12 1132  PROBNP 4185.0* 5109.0* 3125.0*   CBG:  Recent Labs Lab 01/11/13 2116 01/12/13 0737 01/12/13 1122 01/12/13 1655 01/12/13 2048  GLUCAP 114* 122* 123* 93 99    No results found for this or any previous visit (from the past 240 hour(s)).   Studies: No results found.  Scheduled Meds: . amiodarone  400 mg Oral Daily  . apixaban  2.5 mg Oral BID  . atorvastatin  20 mg Oral QHS  . divalproex  250 mg Oral Q12H  . furosemide  40 mg Oral BID  . insulin aspart  0-9 Units Subcutaneous TID WC  . levETIRAcetam  1,000 mg Oral BID  . levETIRAcetam  500 mg Oral Once  . metoprolol tartrate  25 mg Oral BID  . potassium chloride SA  20 mEq Oral BID  . sertraline  25 mg Oral Daily   . sodium chloride  3 mL Intravenous Q12H   Continuous Infusions:   Principal Problem:   Seizure Active Problems:   Pacemaker malfunction   [facility]  [facility] Pager [phone]. If 7PM-7AM, please contact night-coverage at [email or web address], password [facility] 01/13/2013, 7:22 AM  LOS: 4 days

## 2013-01-14 LAB — BASIC METABOLIC PANEL
BUN: 11 mg/dL (ref 6–23)
CO2: 33 mEq/L — ABNORMAL HIGH (ref 19–32)
Chloride: 99 mEq/L (ref 96–112)
Creatinine, Ser: 0.85 mg/dL (ref 0.50–1.10)
GFR calc Af Amer: 67 mL/min — ABNORMAL LOW (ref >90)
GFR calc non Af Amer: 58 mL/min — ABNORMAL LOW (ref >90)

## 2013-01-14 LAB — GLUCOSE, CAPILLARY: Glucose-Capillary: 93 mg/dL (ref 70–99)

## 2013-01-14 MED ORDER — POTASSIUM CHLORIDE CRYS ER 20 MEQ PO TBCR
20.0000 meq | EXTENDED_RELEASE_TABLET | Freq: Once | ORAL | Status: AC
  Administered 2013-01-14: 20 meq via ORAL

## 2013-01-14 MED ORDER — LEVETIRACETAM 1000 MG PO TABS: 1000.0000 mg | ORAL_TABLET | Freq: Two times a day (BID) | ORAL | Status: DC

## 2013-01-14 MED ORDER — DIVALPROEX SODIUM 250 MG PO DR TAB: 250.0000 mg | DELAYED_RELEASE_TABLET | Freq: Two times a day (BID) | ORAL | Status: DC

## 2013-01-14 NOTE — Progress Notes (Signed)
Physical Therapy Treatment Patient Details Name: [name] MRN: [id number] DOB: [birth year] Today's Date: 01/14/2013 Time: 4098-1191 PT Time Calculation (min): 28 min  PT Assessment / Plan / Recommendation  History of Present Illness 79 year old female admitted for seizure s/p stroke in August. Was ambulatory with assist and RW in the home, daughter lives with pt since CVA.  Pt had a second seizure during her hospital stay which has left her with increased L upper extremity weakness/discoordination and decreased left lower extremity strength as well.     PT Comments   Patient was sleeping in room today without her [place] on, unfortunately the family had just stepped for lunch and was unable to help and observe her therapy session today.  Patient demonstrated poor seated balance and limited ambulation as detailed below. Pt will benefit from skilled acute care PT to address her limitations as she is d/c to SNF.   Follow Up Recommendations  SNF           Equipment Recommendations  None recommended by PT       Frequency Min 3X/week   Progress towards PT Goals Progress towards PT goals: Progressing toward goals  Plan Current plan remains appropriate    Precautions / Restrictions Precautions Precautions: Fall Precaution Comments: L inattention, L sided weakness and decreased coordination.  Restrictions Weight Bearing Restrictions: No       Mobility  Bed Mobility Supine to Sit: 3: Mod assist Sitting - Scoot to Edge of Bed: 3: Mod assist Scooting to [facility]: Patient Percentage: 70% Details for Bed Mobility Assistance: Needs trunk support to achieve final phase it sitting.  Requires help with her left side due to weakness present Transfers Transfers: Stand to Sit Sit to Stand: 1: +2 Total assist Sit to Stand: Patient Percentage: 50% Stand to Sit: 1: +2 Total assist Stand to Sit: Patient Percentage: 40% Ambulation/Gait Ambulation/Gait Assistance: 3: Mod assist Ambulation Distance  (Feet): 20 Feet Assistive device: Rollator Ambulation/Gait Assistance Details: Pt had acceptable upright posture when starting gait, but she quickly fatigues.  As she fatigued she leaned to her left and hunched forward on her AD. Gait Pattern: Decreased stride length;Step-through pattern Gait velocity: decreased      PT Goals (current goals can now be found in the care plan section) Acute Rehab PT Goals Patient Stated Goal: to decrease the burden on her daughter PT Goal Formulation: With patient/family Time For Goal Achievement: 01/27/13 Potential to Achieve Goals: Fair  Visit Information  Last PT Received On: 01/14/13 Assistance Needed: +2 History of Present Illness: 79 year old female admitted for seizure s/p stroke in August. Was ambulatory with assist and RW in the home, daughter lives with pt since CVA.  Pt had a second seizure during her hospital stay which has left her with increased L upper extremity weakness/discoordination and decreased left lower extremity strength as well.      Subjective Data  Patient Stated Goal: to decrease the burden on her daughter   Cognition  Cognition Arousal/Alertness: Awake/alert Behavior During Therapy: WFL for tasks assessed/performed Overall Cognitive Status: Within Functional Limits for tasks assessed Difficult to assess due to: Hard of hearing/deaf (Pt is HOH left ear is better than right ear. )    Balance  Static Sitting Balance Static Sitting - Balance Support: Right upper extremity supported Static Sitting - Level of Assistance: 3: Mod assist (leans to her left side)  End of Session PT - End of Session Equipment Utilized During Treatment: Gait belt Activity Tolerance:  Patient limited by fatigue Patient left: in chair;with call bell/phone within reach Nurse Communication: Other (comment) (put 02 back on because sao2 at 90%)   GP    Barrie Dunker, SPT Pager:  [phone]   Barrie Dunker 01/14/2013, 2:44 PM

## 2013-01-14 NOTE — Progress Notes (Signed)
Clinical Social Work Department BRIEF PSYCHOSOCIAL ASSESSMENT 01/14/2013  Patient:  [facility]     Account Number:  [id number]     Admit date:  01/09/2013  Clinical Social Worker:  Harless Nakayama  Date/Time:  01/14/2013 10:30 AM  Referred by:  Physician  Date Referred:  01/14/2013 Referred for  SNF Placement   Other Referral:   Interview type:  Family Other interview type:   CSW spoke with pt daughter and son-in-law at bedside    PSYCHOSOCIAL DATA Living Status:  WITH ADULT CHILDREN Admitted from facility:   Level of care:   Primary support name:  Sherald Hess [phone] Primary support relationship to patient:  CHILD, ADULT Degree of support available:   Pt has supportive family who are involved in helping with care and making decisions. Pt daughter Ellin Saba [phone] has been at bedside.    CURRENT CONCERNS Current Concerns  Post-Acute Placement   Other Concerns:    SOCIAL WORK ASSESSMENT / PLAN CSW informed pt family has changed their mind on dc plan and would like SNF. CSW spoke with pt daughter at bedside. Pt family has strong preference for [facility]. CSW explained SNF referral process and asked if pt could be [place] a [place]. Pt daughter said she would need to discuss this with her brother as [facility] was the only facility they had on their list of preferred facilities that was easily accessible to pt son. CSW acknowledged understanding of this but did explain that pt could not wait for availability at [facility] if medically ready to dc. Pt daughter stated if [facility] is unable to accept pt then they may choose to dc home. CSW to keep in contact with pt daugher and update on decision from [place]. CSW also notified RN CM of possible home discharge.   Assessment/plan status:  Psychosocial Support/Ongoing Assessment of Needs Other assessment/ plan:   Information/referral to community resources:   SNF List Denied     PATIENT'S/FAMILY'S RESPONSE TO PLAN OF CARE: Pt family is agreeable and wanting SNF but only at [facility], [place] [phone]

## 2013-01-14 NOTE — Progress Notes (Signed)
CSW (Clinical Child psychotherapist) confirmed medication situation with facility and have reached an agreement. Family has been notified and asked to go to [place] as soon as possible to do paperwork. CSW prepared pt dc packet and placed with shadow chart. CSW called for non-emergent ambulance transport. CSW informed pt daughter-in-law at bedside and pt nurse. CSW signing off.  Nijae Doyel, LCSWA [phone]

## 2013-01-14 NOTE — Progress Notes (Addendum)
Clinical Social Work Department CLINICAL SOCIAL WORK PLACEMENT NOTE 01/14/2013  Patient:  Wanda, Johnson  Account Number:  [id number] Admit date:  01/09/2013  Clinical Social Worker:  Sharol Harness, Theresia Majors  Date/time:  01/14/2013 10:30 AM  Clinical Social Work is seeking post-discharge placement for this patient at the following level of care:   SKILLED NURSING   (*CSW will update this form in Epic as items are completed)   01/14/2013  Patient/family provided with [facility] Department of Clinical Social Work's list of facilities offering this level of care within the geographic area requested by the patient (or if unable, by the patient's family).  01/14/2013  Patient/family informed of their freedom to choose among providers that offer the needed level of care, that participate in Medicare, Medicaid or managed care program needed by the patient, have an available bed and are willing to accept the patient.  01/14/2013  Patient/family informed of [facility]' ownership interest in [facility], as well as of the fact that they are under no obligation to receive care at this facility.  PASARR submitted to EDS on Existing PASARR number received from EDS on   FL2 transmitted to all facilities in geographic area requested by pt/family on  01/14/2013 FL2 transmitted to all facilities within larger geographic area on   Patient informed that his/her managed care company has contracts with or will negotiate with  certain facilities, including the following:     Patient/family informed of bed offers received:  01/14/2013 Patient chooses bed at [facility] Physician recommends and patient chooses bed at    Patient to be transferred to [place] on  01/14/2013 Patient to be transferred to facility by [facility]  The following physician request were entered in Epic:   Additional Comments:  [name], LCSWA [phone]

## 2013-01-14 NOTE — Progress Notes (Signed)
Facility unsure if they are able to accept pt because of expensive medication (Eloquis). CSW has asked if family could provide this as pt was on the medication prior to admission per pt nurse. CSW has called and left message for pt daughter to update.  Noam Karaffa, LCSWA [phone]

## 2013-01-14 NOTE — Progress Notes (Signed)
   Asked to evaluate patient's pacemaker site prior to discharge.  Device has been interrogated this admission with normal device function.  Left chest well healed without hematoma. Steri-strips in place.  Pacemaker generator change 01-02-2013.  Pt should keep follow up appt with Dr Johney Frame 03-2012 (recall in system).  Gypsy Balsam, RN, BSN, CCDS 01/14/2013 2:45 PM

## 2013-01-14 NOTE — ED Provider Notes (Signed)
I saw and evaluated the patient, reviewed the resident's note and I agree with the findings and plan.  Patient brought in for code stroke, onset of left sided weakness after stroke symptoms. Evaluated in conjunction with Neuro, felt to be seizure and Todd's paralysis. Admit to medicine.  EKG Interpretation    Date/Time:    Ventricular Rate:  82 PR Interval:  217 QRS Duration: 123 QT Interval:  415 QTC Calculation: 485 R Axis:   33 Text Interpretation:  Age not entered, assumed to be  79 years old for purpose of ECG interpretation Sinus rhythm Paired ventricular premature complexes Prolonged PR interval Nonspecific intraventricular conduction delay              Gilda Crease, MD 01/14/13 1501

## 2013-01-14 NOTE — Discharge Summary (Addendum)
Physician Discharge Summary  [name] ZOX:[id number] DOB: 11-30-1918 DOA: 01/09/2013  PCP: [name], MD  Admit date: 01/09/2013 Discharge date: 01/14/2013  Recommendations for Outpatient Follow-up:  1. Please check BMP and magnesium in 3 days 2. Monitor blood sugar at least once per day and as needed when not feeling well 3. Fall precautions 4. Seizure precautions  Discharge Diagnoses:     Seizure, recurrent - treated  Patient Active Problem List   Diagnosis Date Noted  . Pacemaker malfunction 01/12/2013  . Tachycardia-bradycardia syndrome 01/02/2013  . Acute on chronic diastolic heart failure 01/02/2013  . Chronic diastolic heart failure 01/01/2013  . SOB (shortness of breath) 12/27/2012  . Diastolic congestive heart failure 12/27/2012  . Enterococcus UTI 11/07/2012  . Cerebral embolism with cerebral infarction 10/26/2012  . Seizure 10/26/2012  . Acute respiratory failure 10/26/2012  . Intracranial hematoma 10/26/2012  . Acute combined systolic and diastolic congestive heart failure 10/12/2012  . Hyponatremia 10/12/2012  . A-fib 10/12/2012  . Diabetes 10/12/2012  . Hyperlipidemia 10/12/2012  . Atherosclerosis of native arteries of the extremities with gangrene 04/10/2011  . Swelling of limb 04/10/2011    Discharge Condition: stable   Diet recommendation: carb modified, heart healthy  Filed Weights   01/09/13 2205 01/11/13 0500 01/11/13 0700  Weight: 172 lb 6.4 oz (78.2 kg) 186 lb 3.2 oz (84.46 kg) 186 lb 4.6 oz (84.5 kg)    History of present illness:  [name] is a 79 y.o. female with h/o previous posterior CVA s/p TPA back in Aug who presents to the ED with seizure activity in her L side that started at 5:05 pm. She apparently never lost consiousness during seizures prior to ambulance transport per family. On arrival was speaking and tremor had stopped. She had persistent L sided weakness and code stroke was called, patient had recurrent seizure  activity here and 2mg  IV ativan aborted the seizure but patient remained post ictal / sedated.  Hospital Course:  Recurrent Seizure - no further seizure reported, keppra dose increased to 1000 mg po BID and depakote started.  Pt is tolerating. appreciate neurology assistance and they have signed off. Seizure precautions.  Outpatient neurology follow up arranged for 11/24.  Hypokalemia - repleted. Repeat BMP in 3 days recommended   Hypomagnesium - improved after IV magnesium given  CHF - stable, compensated, continue cardiac medications SSS,   Atrial Fibrillation - continue eliquis, amio, metoprolol, [employer] rep has reported PPM is functioning adequately. appreciate cardiology assistance.   DM type 2 - resume home metformin at discharge.  BS well controlled, monitor BS and provided supplemental insulin as needed.   Physical Deconditioning - Pt is being discharged to SNF rehab for further PT and skilled care.    Consultations:  Neurology  cardiology  Discharge Exam: Pt is without complaints, No further seizures.   Filed Vitals:   01/14/13 0615  BP: 139/71  Pulse: 60  Temp: 98 F (36.7 C)  Resp: 18   General: awake, alert, oriented, no distress Cardiovascular: normal s1, s2 paced Respiratory: BBS clear to auscultation  Discharge Instructions  Discharge Orders   Future Appointments Provider Department Dept Phone   01/15/2013 3:00 PM [facility] [place] [facility] [phone]   01/20/2013 2:45 PM Micki Riley, MD [facility] [phone]   Future Orders Complete By Expires   Diet - low sodium heart healthy  As directed    Discharge instructions  As directed    Comments:  Monitor blood sugars at least once per day and as needed when not feeling well Check BMP in 3 days Return if symptoms recur, worsen or new problems develop Fall precautions Seizure precautions   Discontinue IV  As directed    Increase activity slowly  As  directed        Medication List         amiodarone 200 MG tablet  Commonly known as:  PACERONE  Take 2 tablets (400 mg total) by mouth daily.     apixaban 2.5 MG Tabs tablet  Commonly known as:  ELIQUIS  Take 1 tablet (2.5 mg total) by mouth 2 (two) times daily.     atorvastatin 20 MG tablet  Commonly known as:  LIPITOR  Take 20 mg by mouth at bedtime.     divalproex 250 MG DR tablet  Commonly known as:  DEPAKOTE  Take 1 tablet (250 mg total) by mouth every 12 (twelve) hours.     furosemide 40 MG tablet  Commonly known as:  LASIX  Take 1 tablet (40 mg total) by mouth 2 (two) times daily.     levETIRAcetam 1000 MG tablet  Commonly known as:  KEPPRA  Take 1 tablet (1,000 mg total) by mouth 2 (two) times daily.     metFORMIN 500 MG 24 hr tablet  Commonly known as:  GLUCOPHAGE-XR  Take 1 tablet (500 mg total) by mouth 3 (three) times daily after meals.     metoprolol tartrate 25 MG tablet  Commonly known as:  LOPRESSOR  Take 25 mg by mouth 2 (two) times daily.     potassium chloride SA 20 MEQ tablet  Commonly known as:  K-DUR,KLOR-CON  Take 20 mEq by mouth 2 (two) times daily.     sertraline 25 MG tablet  Commonly known as:  ZOLOFT  Take 25 mg by mouth daily.       Allergies  Allergen Reactions  . Nitrofurantoin Nausea And Vomiting  . Protonix [Pantoprazole Sodium] Nausea Only  . Sulfa Antibiotics Hives  . Trimethoprim Nausea Only    The results of significant diagnostics from this hospitalization (including imaging, microbiology, ancillary and laboratory) are listed below for reference.    Significant Diagnostic Studies: Dg Chest 2 View  12/27/2012   CLINICAL DATA:  Shortness of breath.  EXAM: CHEST  2 VIEW  COMPARISON:  Two-view chest 11/07/2012  FINDINGS: The heart is enlarged. A left pleural effusion and basilar airspace disease has increased since the prior study. Mild pulmonary vascular congestion is evident. A small right pleural effusion is noted as  well. Pacing wires are stable. Atherosclerotic calcifications are noted in the aorta. The upper lung fields are clear. Emphysematous changes are noted. Degenerative changes throughout the thoracic spine are stable.  IMPRESSION: 1. Increased left pleural effusion and basilar airspace disease. Pneumonia is not excluded. 2. Small right pleural effusion and associated atelectasis. 3. Emphysema. 4. Cardiomegaly and mild pulmonary vascular congestion without frank edema otherwise.   Electronically Signed   By: Gennette Pac M.D.   On: 12/27/2012 11:59   Ct Head Wo Contrast  01/09/2013   CLINICAL DATA:  Left side weakness. Focal seizure. Previous stroke.  EXAM: CT HEAD WITHOUT CONTRAST  TECHNIQUE: Contiguous axial images were obtained from the base of the skull through the vertex without intravenous contrast.  COMPARISON:  03/28/2012.  FINDINGS: Old right posterior watershed distribution infarct. Stable enlarged ventricles and subarachnoid spaces and patchy white matter low density in both cerebral hemispheres. No  intracranial hemorrhage, mass lesion or CT evidence of acute infarction. Stable oval lucency in the right parietal bone. This is unchanged since 11/09/2007, compatible with a benign process. Normally pneumatized paranasal sinuses.  IMPRESSION: 1. No acute abnormality. 2. Old right posterior watershed distribution infarct. 3. Stable atrophy and chronic small vessel white matter ischemic changes. These results were called by telephone at the time of interpretation on 01/09/2013 at 7:09 PM to Dr. Roseanne Reno, who verbally acknowledged these results.   Electronically Signed   By: Gordan Payment M.D.   On: 01/09/2013 19:09    Microbiology: No results found for this or any previous visit (from the past 240 hour(s)).   Labs: Basic Metabolic Panel:  Recent Labs Lab 01/09/13 1943 01/10/13 0501 01/12/13 0918 01/13/13 0455 01/14/13 0525  NA 138 143 138 140 140  K 3.2* 3.6 4.3 3.6 3.4*  CL 95* 101 94* 97 99   CO2 27 32 27 31 33*  GLUCOSE 168* 116* 138* 102* 105*  BUN 14 14 13 14 11   CREATININE 0.92 0.87 0.92 0.82 0.85  CALCIUM 7.9* 7.4* 8.2* 8.4 8.5  MG  --   --  1.3* 1.8 1.8   Liver Function Tests:  Recent Labs Lab 01/09/13 1943  AST 32  ALT 25  ALKPHOS 78  BILITOT 0.6  PROT 6.2  ALBUMIN 3.0*   No results found for this basename: LIPASE, AMYLASE,  in the last 168 hours No results found for this basename: AMMONIA,  in the last 168 hours CBC:  Recent Labs Lab 01/09/13 1902 01/09/13 1943 01/10/13 0501  WBC  --  12.2* 9.4  NEUTROABS  --  5.8  --   HGB 14.6 13.7 12.1  HCT 43.0 40.8 36.1  MCV  --  95.6 93.5  PLT  --  342 283   Cardiac Enzymes: No results found for this basename: CKTOTAL, CKMB, CKMBINDEX, TROPONINI,  in the last 168 hours BNP: BNP (last 3 results)  Recent Labs  10/29/12 0933 10/30/12 0611 12/27/12 1132  PROBNP 4185.0* 5109.0* 3125.0*   CBG:  Recent Labs Lab 01/13/13 0738 01/13/13 1146 01/13/13 1647 01/13/13 2042 01/14/13 0737  GLUCAP 94 138* 154* 115* 93   Signed:  Clanford Johnson  [facility] 01/14/2013, 8:06 AM

## 2013-01-15 ENCOUNTER — Ambulatory Visit: Payer: Medicare Other

## 2013-01-15 NOTE — Progress Notes (Signed)
Read, reviewed, and agree.  Jozalyn Baglio B. Claryce Friel, PT, DPT #[phone]  

## 2013-01-20 ENCOUNTER — Encounter: Payer: Self-pay | Admitting: Neurology

## 2013-01-20 ENCOUNTER — Ambulatory Visit (INDEPENDENT_AMBULATORY_CARE_PROVIDER_SITE_OTHER): Payer: Medicare Other | Admitting: Neurology

## 2013-01-20 VITALS — BP 139/61 | HR 61 | Wt 172.0 lb

## 2013-01-20 DIAGNOSIS — G40109 Localization-related (focal) (partial) symptomatic epilepsy and epileptic syndromes with simple partial seizures, not intractable, without status epilepticus: Secondary | ICD-10-CM

## 2013-01-20 DIAGNOSIS — F05 Delirium due to known physiological condition: Secondary | ICD-10-CM

## 2013-01-20 MED ORDER — LEVETIRACETAM 1000 MG PO TABS: 500.0000 mg | ORAL_TABLET | Freq: Two times a day (BID) | ORAL | Status: DC

## 2013-01-20 NOTE — Patient Instructions (Signed)
Continue apixaban for stroke prevention for atrial fibrillation he had decreased Keppra dose to 500 mg twice daily due to her cognitive side effects and continue Depakote 250 twice daily. Check valproic acid level, ammonia level and urine analysis. Continue physical and occupational therapy. Return for followup in 3 months or call earlier if necessary.

## 2013-01-21 NOTE — Progress Notes (Signed)
[facility] [address] [place]. [place] [address] [phone]       OFFICE FOLLOW-UP NOTE  [name] Date of Birth:  [birth year] Medical Record Number:  [id number]   HPI: 61 year lady  seen for first office f/u after [facility] admission for stroke on 10/26/12.She presented with slurred speech and left hemiplegia with NIH 21.she was given iv TPA without complications and made near complete recovery .she developed seizures with hypoxia needing temporary intubation.EKG showed atrial fibrillation and Echo showed grade 3 diatolic dysfunction.She had transient worsening of renal function and CHF exacerbation which needed diuresis.Carotid dopplers showed no large vesel stenosis.She had been on warfarin in past which ahd been stopped for remote GI bleed hence was switched to eliquis for stroke prevention which she is tolerating well. She did well with therapy initially but was readmitted twice since stroke discharge for CHF exacerbation and had 2 episodes of seizures 2 and 1 week ago.Keppra dose was increased and depakote was added.Daughter states she has noticed delusions and hallucinations since increasing keppra dose.she is presently at [facility] nursing home. ROS:   14 system review of systems is positive for fatigue,palpititions,cough,easy bruising,confusion,headache,numbness,seizure,sleepiness,tremor,decreased energy,hallucinations,urination problems,itching,cough  PMH:  Past Medical History  Diagnosis Date  . Diabetes mellitus   . Hypertension   . Hyperlipidemia   . Arthritis   . AF (atrial fibrillation)   . Cataract     decreased vision in left eye  . Peripheral vascular disease   . CHF (congestive heart failure)   . Stroke   . Tachycardia-bradycardia syndrome     Social History:  History   Social History  . Marital Status: Married    Spouse Name: N/A    Number of Children: 4  . Years of Education: 9   Occupational History  . Ret    Social History  Main Topics  . Smoking status: Never Smoker   . Smokeless tobacco: Never Used  . Alcohol Use: No     Comment: caffeine 1/3 cup daily  . Drug Use: No  . Sexual Activity: Not on file   Other Topics Concern  . Not on file   Social History Narrative   Pt lives in a [facility]   Widow 4 children    2 have passed   9th gr. Edu.    Medications:   Current Outpatient Prescriptions on File Prior to Visit  Medication Sig Dispense Refill  . amiodarone (PACERONE) 200 MG tablet Take 2 tablets (400 mg total) by mouth daily.  60 tablet  5  . apixaban (ELIQUIS) 2.5 MG TABS tablet Take 1 tablet (2.5 mg total) by mouth 2 (two) times daily.  60 tablet  1  . atorvastatin (LIPITOR) 20 MG tablet Take 20 mg by mouth at bedtime.       . divalproex (DEPAKOTE) 250 MG DR tablet Take 1 tablet (250 mg total) by mouth every 12 (twelve) hours.  60 tablet  0  . furosemide (LASIX) 40 MG tablet Take 1 tablet (40 mg total) by mouth 2 (two) times daily.  60 tablet  1  . metFORMIN (GLUCOPHAGE-XR) 500 MG 24 hr tablet Take 1 tablet (500 mg total) by mouth 3 (three) times daily after meals.      . metoprolol tartrate (LOPRESSOR) 25 MG tablet Take 25 mg by mouth 2 (two) times daily.      . potassium chloride SA (K-DUR,KLOR-CON) 20 MEQ tablet Take 20 mEq by mouth 2 (two) times daily.      Marland Kitchen  sertraline (ZOLOFT) 25 MG tablet Take 25 mg by mouth daily.       No current facility-administered medications on file prior to visit.    Allergies:   Allergies  Allergen Reactions  . Nitrofurantoin Nausea And Vomiting  . Protonix [Pantoprazole Sodium] Nausea Only  . Sulfa Antibiotics Hives  . Trimethoprim Nausea Only    Physical Exam General: obese elderly caucasian lady, seated, in no evident distress Head: head normocephalic and atraumatic. Orohparynx benign Neck: supple with no carotid or supraclavicular bruits Cardiovascular: regular rate and rhythm, no murmurs Musculoskeletal: no deformity Skin:  no  rash/petichiae. 1+ pedal edema Vascular:  Diminished distal pulses Filed Vitals:   01/20/13 1502  BP: 139/61  Pulse: 61    Neurologic Exam Mental Status: Awake and fully alert. Oriented to place and time. Recent and remote memory intact. Attention span, concentration and fund of knowledge appropriate. Mood and affect appropriate.  Cranial Nerves: Fundoscopic exam reveals sharp disc margins. Pupils equal, briskly reactive to light. Extraocular movements full without nystagmus. Visual fields full to confrontation. Hearing intact. Facial sensation intact. Face, tongue, palate moves normally and symmetrically.  Motor: Normal bulk and tone. Normal strength in all tested extremity muscles. Mild weakness of the left grip and intrinsic hand muscles as well as left hip flexors and ankle dorsiflexors. Orbits right over left upper extremity. Fine finger movements are diminished on the left. Sensory.: intact to touch and pinprick and vibratory sensation.  Coordination: Rapid alternating movements normal in all extremities. Finger-to-nose and heel-to-shin performed accurately bilaterally. Gait and Station: Unable to arise from a chair even with assistance and hence gait not tested.  Reflexes: 1+ and asymmetric and brisker on the left. Toes downgoing.     ASSESSMENT: 24 year Caucasian lady with right middle cerebral artery infarct in August 2014 secondary to atrial fibrillation treated with IV TPA with significant clinical improvement. Post hospital stay complicated by recurrent admissions for CHF and seizures. Recent hallucinations and mental status changes may be medication induced    PLAN: Continue apixaban for stroke prevention for atrial fibrillation and decrease Keppra dose to 500 mg twice daily due to her cognitive side effects and continue Depakote 250 twice daily. Check valproic acid level, ammonia level and urine analysis. Continue physical and occupational therapy. Return for followup in 3  months or call earlier if necessary.

## 2013-01-22 ENCOUNTER — Telehealth: Payer: Self-pay | Admitting: Neurology

## 2013-01-29 ENCOUNTER — Telehealth: Payer: Self-pay | Admitting: Neurology

## 2013-02-03 ENCOUNTER — Encounter: Payer: Medicare Other | Admitting: Internal Medicine

## 2013-02-04 ENCOUNTER — Ambulatory Visit (INDEPENDENT_AMBULATORY_CARE_PROVIDER_SITE_OTHER): Payer: Medicare Other | Admitting: Nurse Practitioner

## 2013-02-04 ENCOUNTER — Encounter: Payer: Self-pay | Admitting: Nurse Practitioner

## 2013-02-04 VITALS — BP 159/68 | HR 62

## 2013-02-04 DIAGNOSIS — G40109 Localization-related (focal) (partial) symptomatic epilepsy and epileptic syndromes with simple partial seizures, not intractable, without status epilepticus: Secondary | ICD-10-CM

## 2013-02-04 DIAGNOSIS — G8110 Spastic hemiplegia affecting unspecified side: Secondary | ICD-10-CM

## 2013-02-04 DIAGNOSIS — G8114 Spastic hemiplegia affecting left nondominant side: Secondary | ICD-10-CM

## 2013-02-04 MED ORDER — DIVALPROEX SODIUM 250 MG PO DR TAB: DELAYED_RELEASE_TABLET | ORAL | Status: AC

## 2013-02-04 MED ORDER — LEVETIRACETAM 250 MG PO TABS: 250.0000 mg | ORAL_TABLET | Freq: Two times a day (BID) | ORAL | Status: AC

## 2013-02-04 NOTE — Progress Notes (Signed)
PATIENT: [name] DOB: 12-23-1918   REASON FOR VISIT: follow up for seizures, stroke HISTORY FROM: patient  HISTORY OF PRESENT ILLNESS: 41 year lady seen for first office f/u after [facility] admission for stroke on 10/26/12.She presented with slurred speech and left hemiplegia with NIH 21.she was given iv TPA without complications and made near complete recovery .she developed seizures with hypoxia needing temporary intubation.EKG showed atrial fibrillation and Echo showed grade 3 diatolic dysfunction.She had transient worsening of renal function and CHF exacerbation which needed diuresis.Carotid dopplers showed no large vesel stenosis.She had been on warfarin in past which ahd been stopped for remote GI bleed hence was switched to eliquis for stroke prevention which she is tolerating well. She did well with therapy initially but was readmitted twice since stroke discharge for CHF exacerbation and had 2 episodes of seizures 2 and 1 week ago. Keppra dose was increased and depakote was added. Daughter states she has noticed delusions and hallucinations since increasing keppra dose.she is presently at [facility] nursing home.   UPDATE 02/04/13 (LL):  Patient's daughter requests acute visit.  She feels her mother has declined since last seizure that occurred one day after seeing Dr. Pearlean Brownie 2 weeks ago.  She is unsure if her mother's seizures are adequately being controlled and she is concerned that their is increased muscle tone in the left arm.  Valproic acid level was 30 on 01/25/13 and dose was increased to 300 mg BID at [facility].  Plan is still to bring her mother home when she is stronger.  ROS:  14 system review of systems is positive for fatigue, easy bruising, confusion, headache, numbness, seizure, sleepiness, tremor, decreased energy, urination problems  ALLERGIES: Allergies  Allergen Reactions  . Nitrofurantoin Nausea And Vomiting  . Protonix [Pantoprazole Sodium] Nausea Only  .  Sulfa Antibiotics Hives  . Trimethoprim Nausea Only    HOME MEDICATIONS: Outpatient Prescriptions Prior to Visit  Medication Sig Dispense Refill  . amiodarone (PACERONE) 200 MG tablet Take 2 tablets (400 mg total) by mouth daily.  60 tablet  5  . apixaban (ELIQUIS) 2.5 MG TABS tablet Take 1 tablet (2.5 mg total) by mouth 2 (two) times daily.  60 tablet  1  . atorvastatin (LIPITOR) 20 MG tablet Take 20 mg by mouth at bedtime.       . furosemide (LASIX) 40 MG tablet Take 1 tablet (40 mg total) by mouth 2 (two) times daily.  60 tablet  1  . metFORMIN (GLUCOPHAGE-XR) 500 MG 24 hr tablet Take 1 tablet (500 mg total) by mouth 3 (three) times daily after meals.      . metoprolol tartrate (LOPRESSOR) 25 MG tablet Take 25 mg by mouth 2 (two) times daily.      . potassium chloride SA (K-DUR,KLOR-CON) 20 MEQ tablet Take 20 mEq by mouth 2 (two) times daily.      . sertraline (ZOLOFT) 25 MG tablet Take 25 mg by mouth daily.      . divalproex (DEPAKOTE) 250 MG DR tablet Take 1 tablet (250 mg total) by mouth every 12 (twelve) hours.  60 tablet  0  . levETIRAcetam (KEPPRA) 1000 MG tablet Take 0.5 tablets (500 mg total) by mouth 2 (two) times daily.  60 tablet  0   No facility-administered medications prior to visit.    PAST MEDICAL HISTORY: Past Medical History  Diagnosis Date  . Diabetes mellitus   . Hypertension   . Hyperlipidemia   . Arthritis   . AF (  atrial fibrillation)   . Cataract     decreased vision in left eye  . Peripheral vascular disease   . CHF (congestive heart failure)   . Stroke   . Tachycardia-bradycardia syndrome     PAST SURGICAL HISTORY: Past Surgical History  Procedure Laterality Date  . Pacemaker insertion  08/08/2006; 12/2012    MDT PPM implanted by Dr Amil Amen for tachybrady syndrome and pauses with afib; generator change by Dr Johney Frame 12/2012 - MDT [id number]  . Cholecystectomy    . Thyroid surgery      FAMILY HISTORY: No family history on file.  SOCIAL  HISTORY: History   Social History  . Marital Status: Married    Spouse Name: N/A    Number of Children: 4  . Years of Education: 9   Occupational History  . Ret    Social History Main Topics  . Smoking status: Never Smoker   . Smokeless tobacco: Never Used  . Alcohol Use: No     Comment: caffeine 1/3 cup daily  . Drug Use: No  . Sexual Activity: Not on file   Other Topics Concern  . Not on file   Social History Narrative   Pt lives in a [facility]   Widow 4 children    2 have passed   9th gr. Edu.     PHYSICAL EXAM  Filed Vitals:   02/04/13 1418  BP: 159/68  Pulse: 62   Physical Exam  General: obese elderly caucasian lady, seated, in no evident distress, drowsy Head: head normocephalic and atraumatic. Orohparynx benign  Neck: supple with no carotid or supraclavicular bruits  Cardiovascular: regular rate and rhythm, no murmurs  Musculoskeletal: no deformity  Skin: no rash/petichiae. 1+ pedal edema  Vascular: Diminished distal pulses   Neurologic Exam  Mental Status: Awake and fully alert. Oriented to place and time. Recent and remote memory intact. Attention span, concentration and fund of knowledge appropriate. Mood and affect appropriate.  Cranial Nerves: Fundoscopic exam reveals sharp disc margins. Pupils equal, briskly reactive to light. Extraocular movements full without nystagmus. Visual fields full to confrontation. Hearing intact. Facial sensation intact. Face, tongue, palate moves normally and symmetrically.  Motor: Increased muscle tone in left upper extremity and left lower extremity.  Mild weakness of the left grip and intrinsic hand muscles as well as left hip flexors and ankle dorsiflexors. Orbits right over left upper extremity. Fine finger movements are diminished on the left.  Sensory: intact to touch and pinprick and vibratory sensation.  Coordination: Rapid alternating movements normal in all extremities. Finger-to-nose and  heel-to-shin performed accurately bilaterally.  Gait and Station: Unable to arise from a chair even with assistance and hence gait not tested.  Reflexes: 1+ and asymmetric and brisker on the left. Toes downgoing.   DIAGNOSTIC DATA (LABS, IMAGING, TESTING) - I reviewed patient records, labs, notes, testing and imaging myself where available.  Lab Results  Component Value Date   WBC 9.4 01/10/2013   HGB 12.1 01/10/2013   HCT 36.1 01/10/2013   MCV 93.5 01/10/2013   PLT 283 01/10/2013      Component Value Date/Time   NA 140 01/14/2013 0525   K 3.4* 01/14/2013 0525   CL 99 01/14/2013 0525   CO2 33* 01/14/2013 0525   GLUCOSE 105* 01/14/2013 0525   BUN 11 01/14/2013 0525   CREATININE 0.85 01/14/2013 0525   CALCIUM 8.5 01/14/2013 0525   PROT 6.2 01/09/2013 1943   ALBUMIN 3.0* 01/09/2013 1943   AST  32 01/09/2013 1943   ALT 25 01/09/2013 1943   ALKPHOS 78 01/09/2013 1943   BILITOT 0.6 01/09/2013 1943   GFRNONAA 58* 01/14/2013 0525   GFRAA 67* 01/14/2013 0525   Lab Results  Component Value Date   CHOL 124 10/27/2012   HDL 75 10/27/2012   LDLCALC 32 10/27/2012   TRIG 87 10/27/2012   CHOLHDL 1.7 10/27/2012   Lab Results  Component Value Date   HGBA1C 6.9* 10/27/2012   No results found for this basename: VITAMINB12   Lab Results  Component Value Date   TSH 1.601 12/27/2012   No results found for this basename: ESRSEDRATE     ASSESSMENT AND PLAN 42 year Caucasian lady with right middle cerebral artery infarct in August 2014 secondary to atrial fibrillation treated with IV TPA with significant clinical improvement. Post hospital stay complicated by recurrent admissions for CHF and seizures. Recent hallucinations and mental status changes may be medication induced.  Rehabilitation has been delayed with recurrent seizure and left sided spasticity post-stroke.  PLAN:  Continue apixaban for stroke prevention for atrial fibrillation and decrease Keppra dose to 250 mg twice daily due  to her cognitive side effects and increase Depakote to 250 mg in the am and 500 mg in the pm daily.  Continue physical and occupational therapy. Return for followup in 3 months or call earlier if necessary.   Meds ordered this encounter  Medications  . DISCONTD: divalproex (DEPAKOTE) 250 MG DR tablet    Sig: Take 300 mg by mouth every 12 (twelve) hours.  Marland Kitchen DISCONTD: levETIRAcetam (KEPPRA) 1000 MG tablet    Sig: Take 750 mg by mouth 2 (two) times daily.  Marland Kitchen acetaminophen (TYLENOL) 500 MG tablet    Sig: Take 500 mg by mouth every 4 (four) hours as needed.  . levETIRAcetam (KEPPRA) 250 MG tablet    Sig: Take 1 tablet (250 mg total) by mouth 2 (two) times daily.    Dispense:  60 tablet    Refill:  5    Order Specific Question:  Supervising Provider    Answer:  Pearlean Brownie, PRAMOD S [[id number]]  . divalproex (DEPAKOTE) 250 MG DR tablet    Sig: 250 mg tablet in the am and 500 mg tablets at night.    Dispense:  90 tablet    Refill:  5    Order Specific Question:  Supervising Provider    Answer:  Micki Riley [[id number]]   Return in about 3 months (around 05/05/2013).  Ronal Fear, MSN, NP-C 02/04/2013, 3:53 PM [facility] [address] [place], [place] [address] [phone]  Note: This document was prepared with digital dictation and possible smart phrase technology. Any transcriptional errors that result from this process are unintentional.

## 2013-02-04 NOTE — Patient Instructions (Signed)
Change Depakote to 250 mg in the am and 500 mg in the pm daily.  Change Keppra to 250 mg daily.  Follow up in 3-4 months, sooner as needed.

## 2013-02-10 ENCOUNTER — Telehealth: Payer: Self-pay | Admitting: Neurology

## 2013-02-10 NOTE — Telephone Encounter (Signed)
Dr. Pearlean Brownie, Could you please call Ms. Kalbfleisch's daughter.  We just saw her in the office on Friday.  Sounds like she is still having seizures.  She's still asking for actual diagnosis and prognosis.  Thanks.

## 2013-02-10 NOTE — Telephone Encounter (Signed)
I called and left a message for patient's daughter Olegario Messier. The patient seems to be having recurrent seizures and worsening neurological exam and may need to go to the hospital for imaging and evaluation.

## 2013-02-11 ENCOUNTER — Other Ambulatory Visit: Payer: Self-pay | Admitting: Nurse Practitioner

## 2013-02-11 DIAGNOSIS — G8114 Spastic hemiplegia affecting left nondominant side: Secondary | ICD-10-CM

## 2013-02-11 DIAGNOSIS — G40109 Localization-related (focal) (partial) symptomatic epilepsy and epileptic syndromes with simple partial seizures, not intractable, without status epilepticus: Secondary | ICD-10-CM

## 2013-02-11 NOTE — Progress Notes (Signed)
Per Dr. Pearlean Brownie, he would like Ms. Jarrard to have the following tests due to her worsening condition: CBC, CMP, UA, and CT Head Without Contrast, to be done on Wed. 01/13/13.

## 2013-02-12 ENCOUNTER — Telehealth: Payer: Self-pay | Admitting: Neurology

## 2013-02-12 NOTE — Telephone Encounter (Signed)
Please advise 

## 2013-02-12 NOTE — Progress Notes (Signed)
All orders have been faxed to [facility] to patient's nurse and a confirmation has been received.

## 2013-02-13 ENCOUNTER — Ambulatory Visit
Admission: RE | Admit: 2013-02-13 | Discharge: 2013-02-13 | Disposition: A | Discharge status: Home / Self Care | Payer: Medicare Other | Source: Ambulatory Visit | Attending: Nurse Practitioner | Admitting: Nurse Practitioner

## 2013-02-13 DIAGNOSIS — G8114 Spastic hemiplegia affecting left nondominant side: Secondary | ICD-10-CM

## 2013-02-13 DIAGNOSIS — I63219 Cerebral infarction due to unspecified occlusion or stenosis of unspecified vertebral arteries: Secondary | ICD-10-CM

## 2013-02-13 DIAGNOSIS — G40109 Localization-related (focal) (partial) symptomatic epilepsy and epileptic syndromes with simple partial seizures, not intractable, without status epilepticus: Secondary | ICD-10-CM

## 2013-02-14 ENCOUNTER — Telehealth: Payer: Self-pay | Admitting: Neurology

## 2013-02-14 NOTE — Telephone Encounter (Signed)
Dr. Pearlean Brownie, Please review patient's CT that was completed yesterday and phone note from daughter.

## 2013-02-14 NOTE — Telephone Encounter (Signed)
Daughter states twitching has stopped but call the facility because her phone is going dead

## 2013-02-14 NOTE — Telephone Encounter (Signed)
I called patient's daughter Olegario Messier and left message on her voicemail to discuss CT scan results

## 2013-03-30 DEATH — deceased

## 2013-04-16 ENCOUNTER — Encounter: Payer: Medicare Other | Admitting: Internal Medicine

## 2013-04-17 ENCOUNTER — Encounter: Payer: Self-pay | Admitting: Internal Medicine

## 2013-05-01 ENCOUNTER — Ambulatory Visit: Payer: Medicare Other | Admitting: Neurology

## 2013-05-01 ENCOUNTER — Ambulatory Visit: Payer: Medicare Other | Admitting: Nurse Practitioner

## 2013-07-03 NOTE — Telephone Encounter (Signed)
Closing encounter

## 2014-02-05 ENCOUNTER — Encounter (HOSPITAL_COMMUNITY): Payer: Self-pay | Admitting: Internal Medicine

## 2014-02-25 NOTE — Telephone Encounter (Signed)
error 

## 2014-09-01 IMAGING — CT CT HEAD W/O CM
2 series · 15 of 30 positions shown, 19 images · non-contrast
Comparison: 07/01/2010.

CLINICAL DATA: Slurred speech and left-sided weakness.  Code
stroke.

CT HEAD WITHOUT CONTRAST
TECHNIQUE: Contiguous axial images were obtained from the base of
the skull through the vertex without contrast.

[Series 3: head w/o · axial · non-contrast · 0.49mm/px · z∈[-113,+17]mm · 13 of 32 slices shown, 17 images]
[im 3/32  brain]
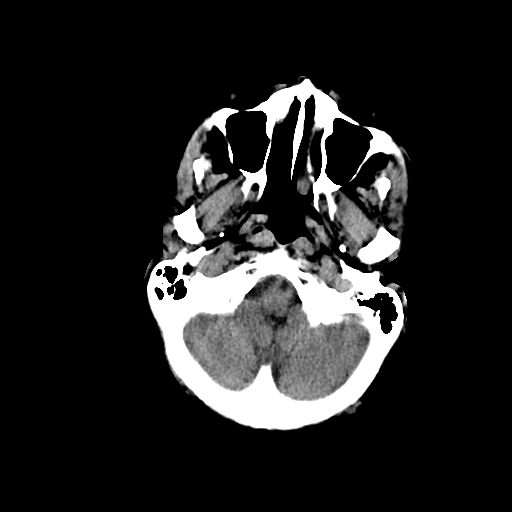
[im 3/32  bone]
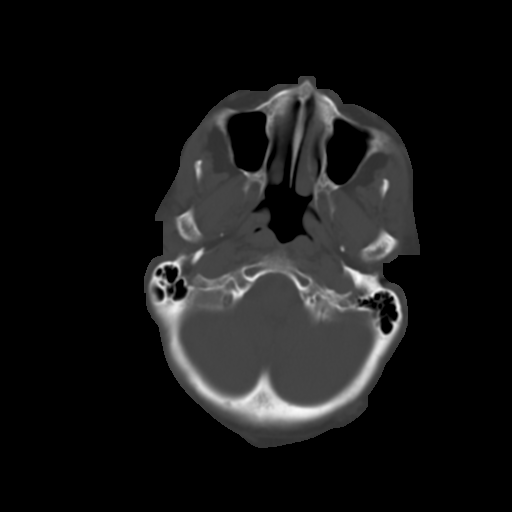
[im 5/32  brain]
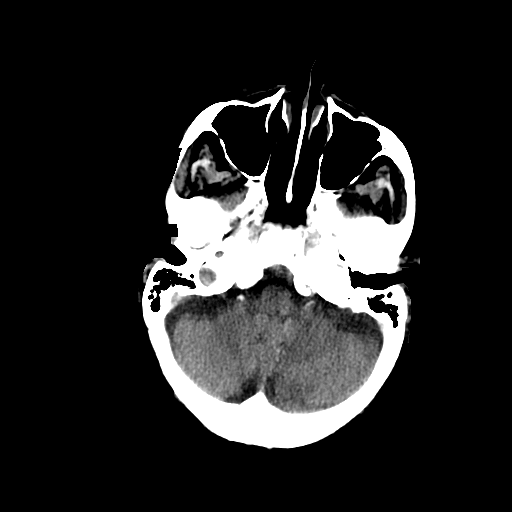
[im 7/32  brain]
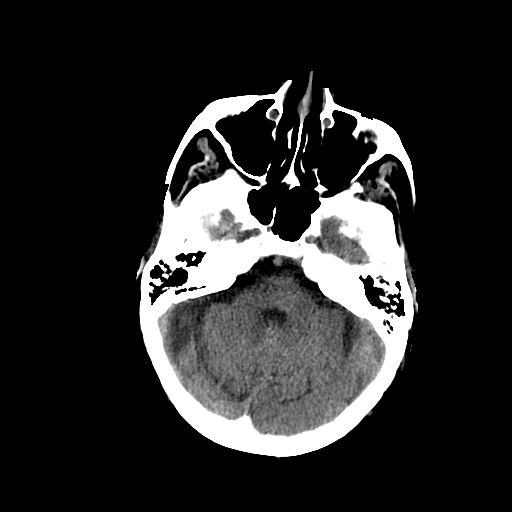
[im 9/32  brain]
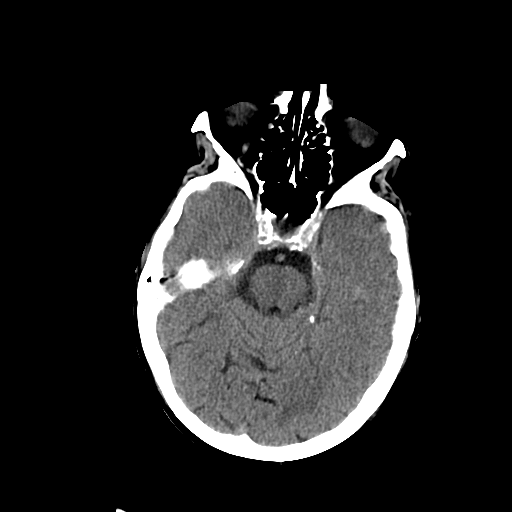
[im 12/32  brain]
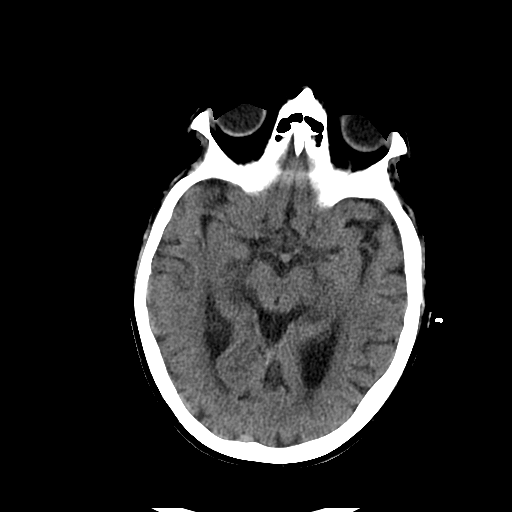
[im 12/32  bone]
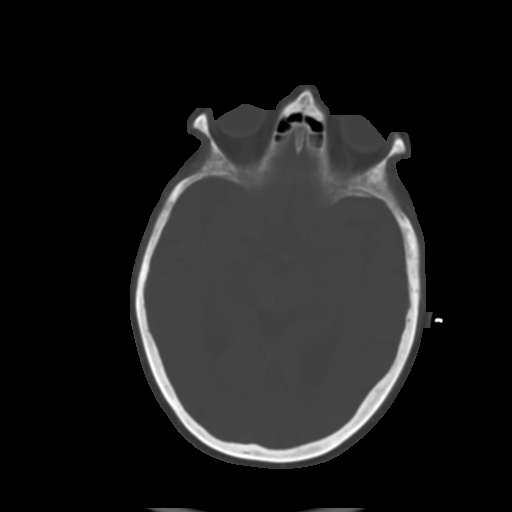
[im 14/32  brain]
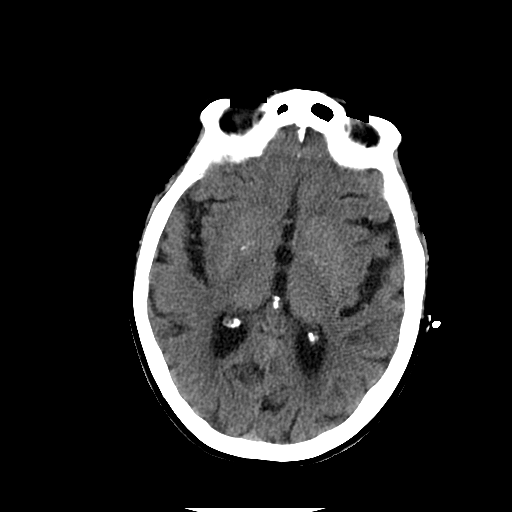
[im 16/32  brain]
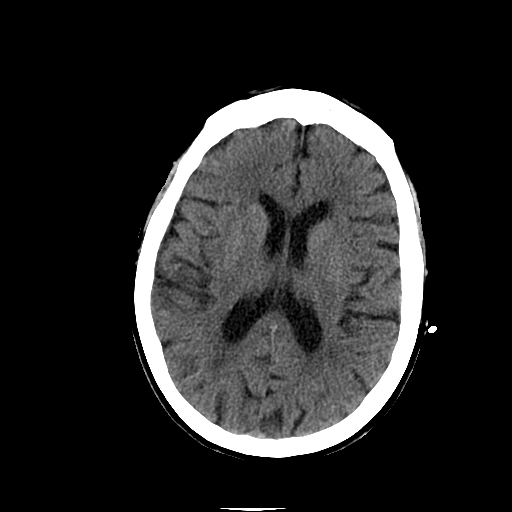
[im 18/32  brain]
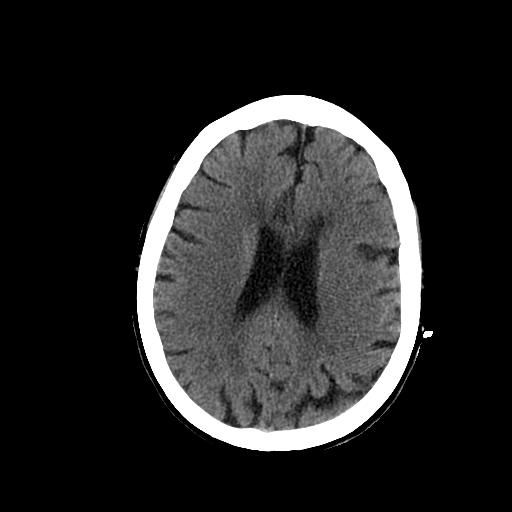
[im 20/32  brain]
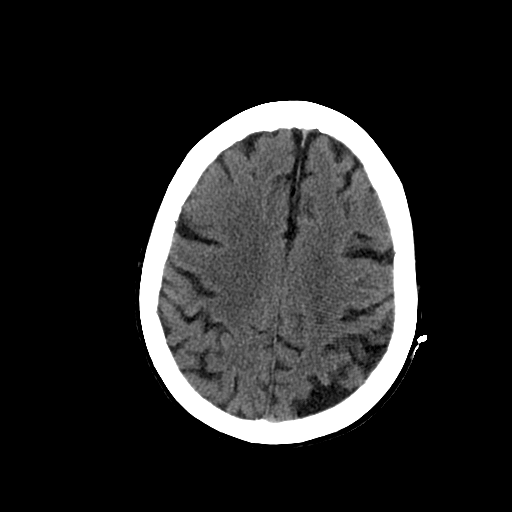
[im 20/32  bone]
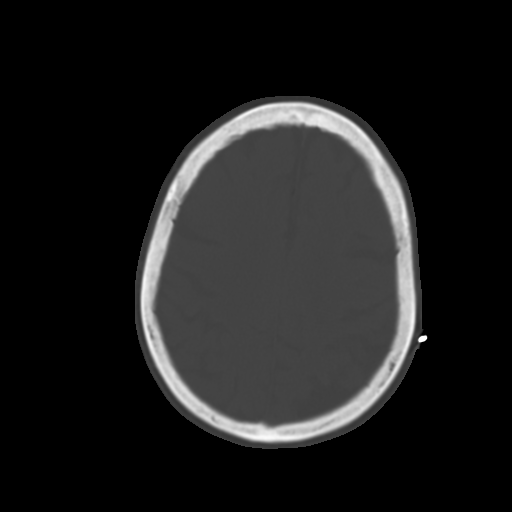
[im 23/32  brain]
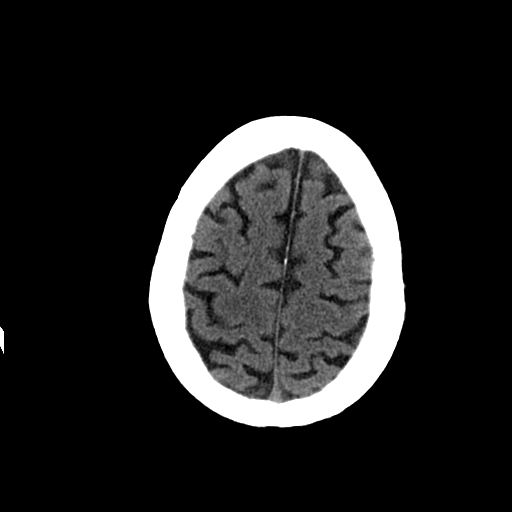
[im 25/32  brain]
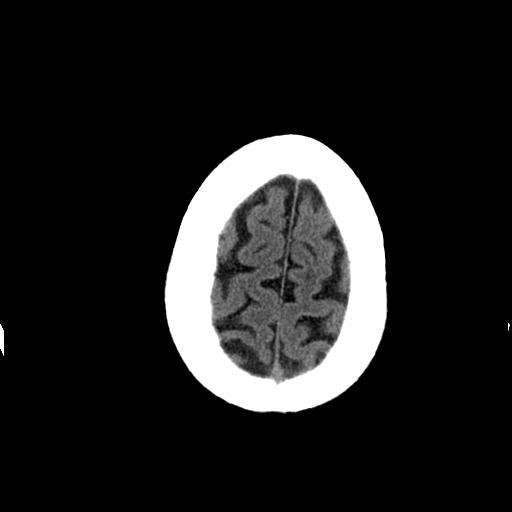
[im 27/32  brain]
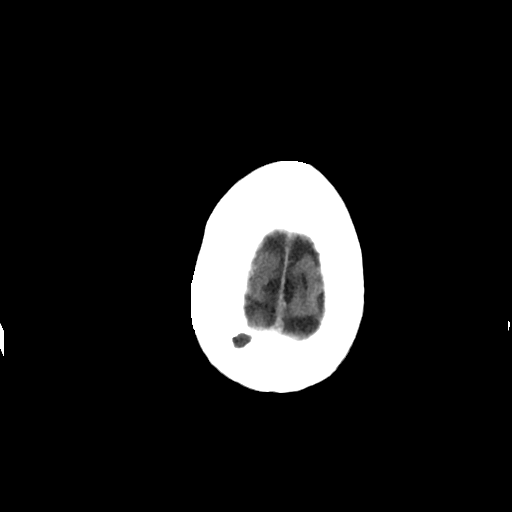
[im 29/32  brain]
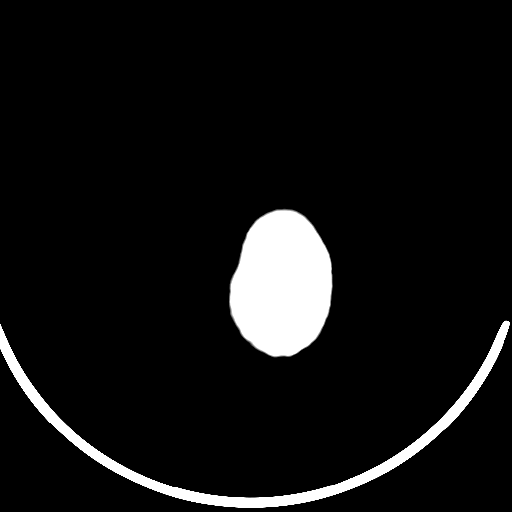
[im 29/32  bone]
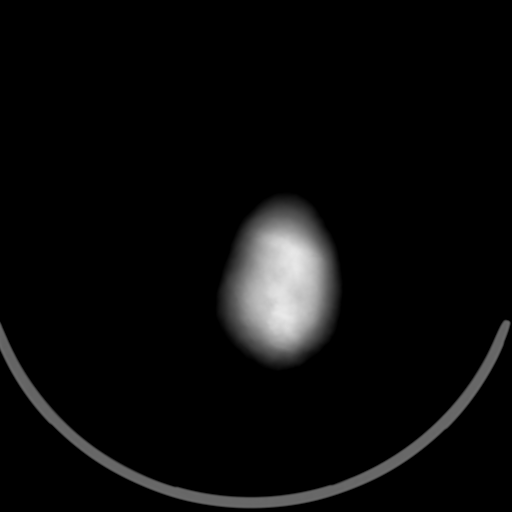

[Series 4: head w/o bone · axial · non-contrast · 0.49mm/px · z∈[-113,-93]mm · 2 of 32 slices shown]
[im 3/32  bone]
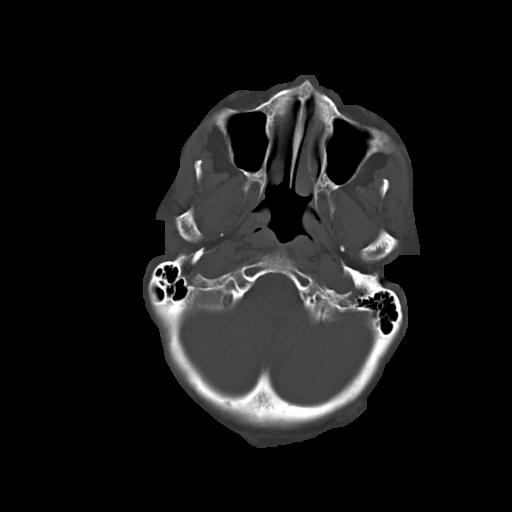
[im 7/32  bone]
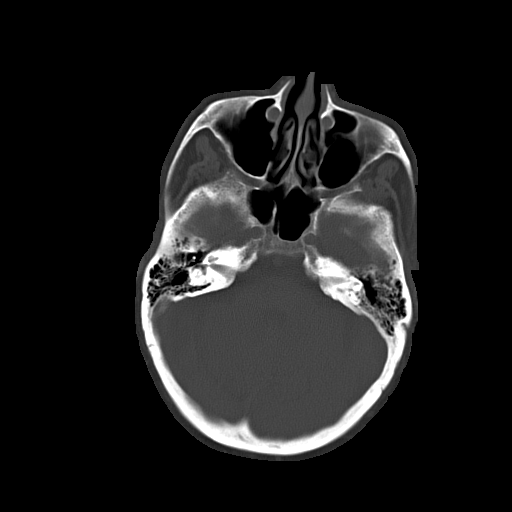

[15 of 30 positions shown; findings below may reference images not displayed]

FINDINGS: Skull:No acute osseous abnormality. No lytic or blastic lesion.

Orbits: Right cataract resection.

Brain: No evidence of acute abnormality, such as acute infarction,
hemorrhage, hydrocephalus, or mass lesion/mass effect (questionable
loss of posterior right insula gray-white differentiation, not
definite given the scan quality).  Similar pattern of patchy
bilateral cerebral white matter low attenuation.  Low attenuation
in the of cortical left occipital pole was likely present
previously, and does not explain provided history.

These results were called by telephone on 10/26/2012 at [DATE] a.m.
to Dr. Langer, who verbally acknowledged these results.
IMPRESSION: 1. No acute intracranial hemorrhage.
2. Questionable early ischemic changes in the posterior right
insula.

3.  Chronic small vessel ischemia.

## 2014-09-01 IMAGING — CT CT HEAD W/O CM
1 series · 16 of 30 positions shown, 20 images · non-contrast
Comparison: 10/26/2012.

CLINICAL DATA: Acute right-sided CVA with new onset respiratory
failure.  CVA treated with t-PA.Seizure.

CT HEAD WITHOUT CONTRAST
TECHNIQUE: Contiguous axial images were obtained from the base of
the skull through the vertex without contrast.

[Series 2: head 5.0 j30s 1 · axial · 0.42mm/px · z∈[-140,-5]mm · 16 of 30 slices shown, 20 images]
[im 2/30  brain]
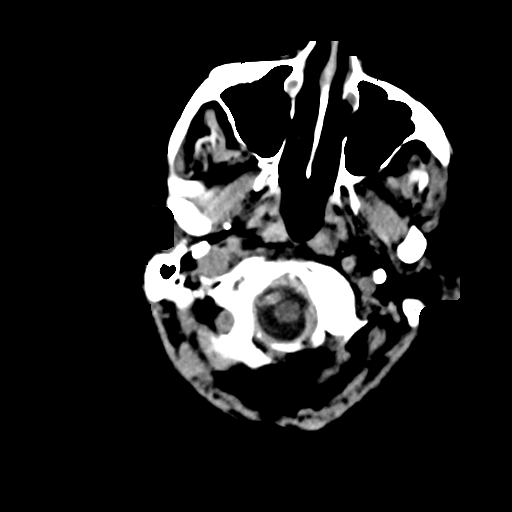
[im 2/30  bone]
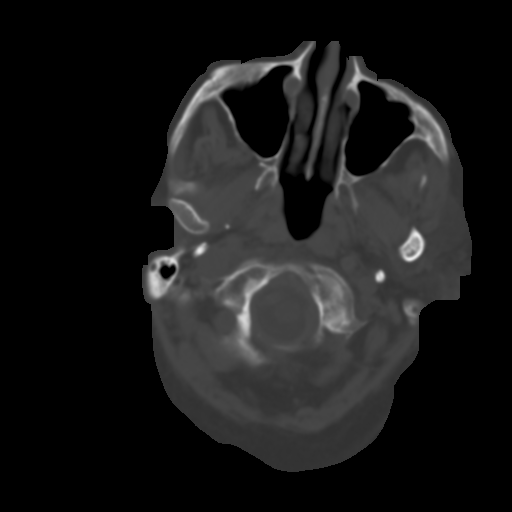
[im 4/30  brain]
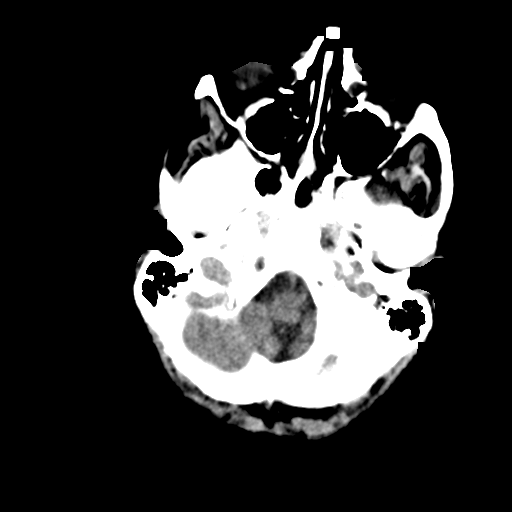
[im 6/30  brain]
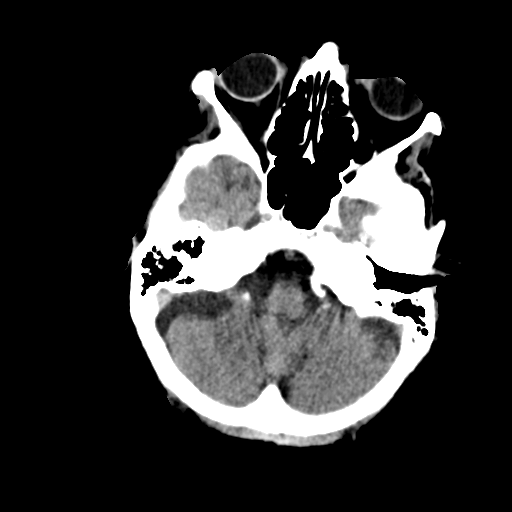
[im 8/30  brain]
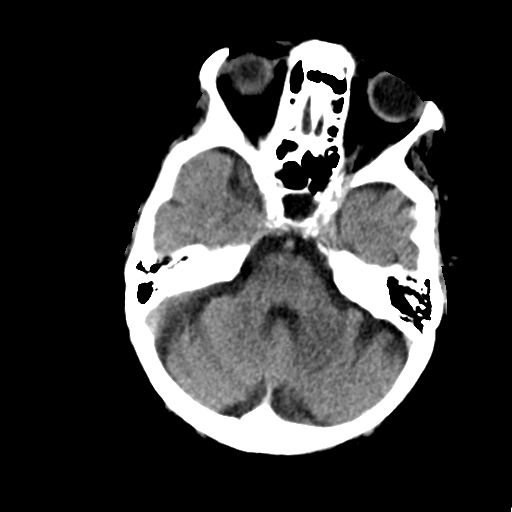
[im 9/30  brain]
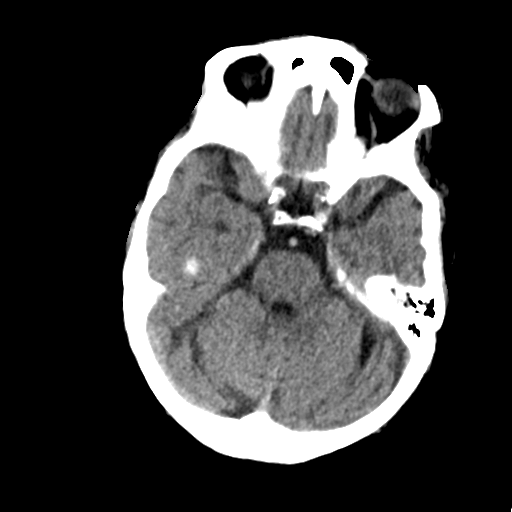
[im 9/30  bone]
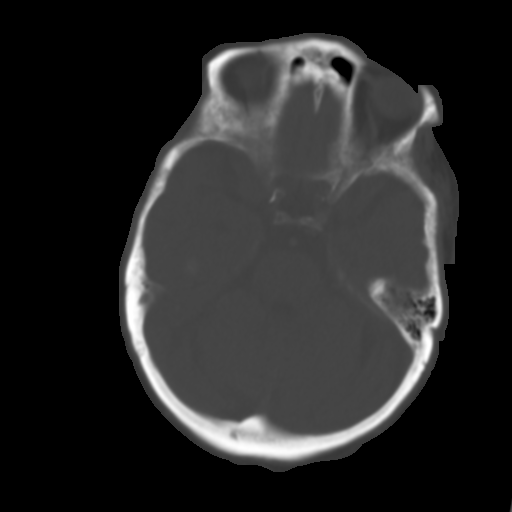
[im 11/30  brain]
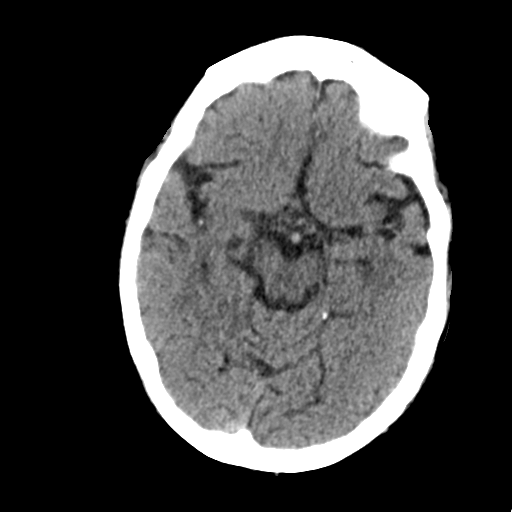
[im 13/30  brain]
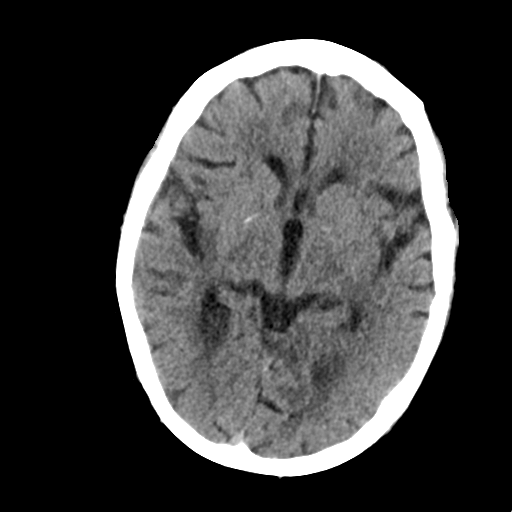
[im 15/30  brain]
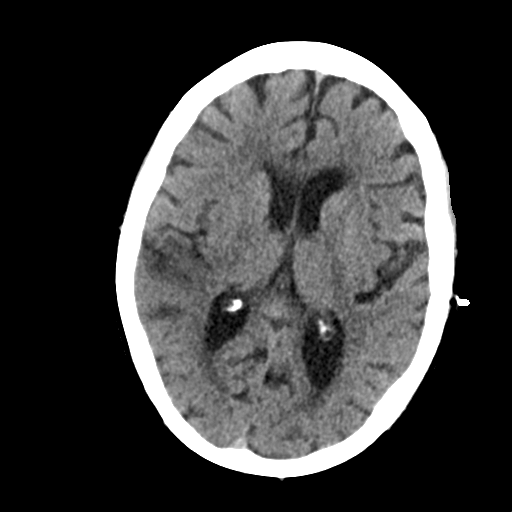
[im 16/30  brain]
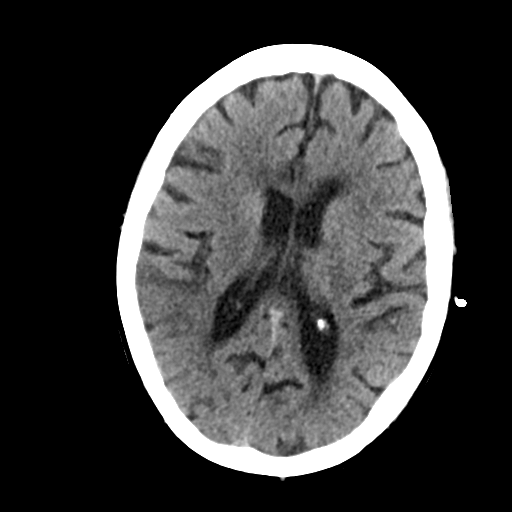
[im 16/30  bone]
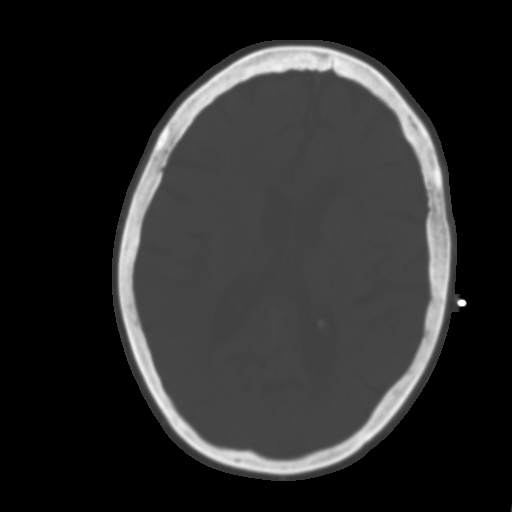
[im 18/30  brain]
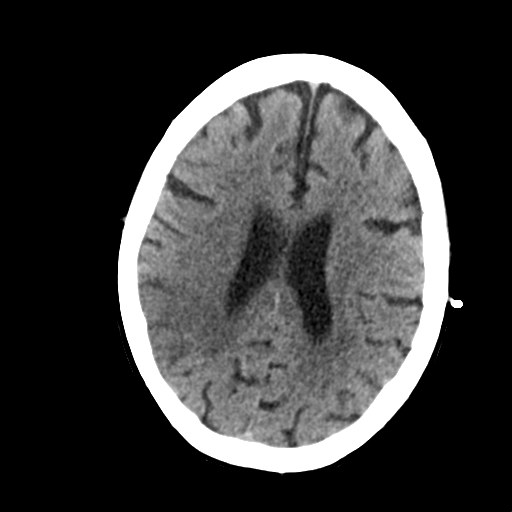
[im 20/30  brain]
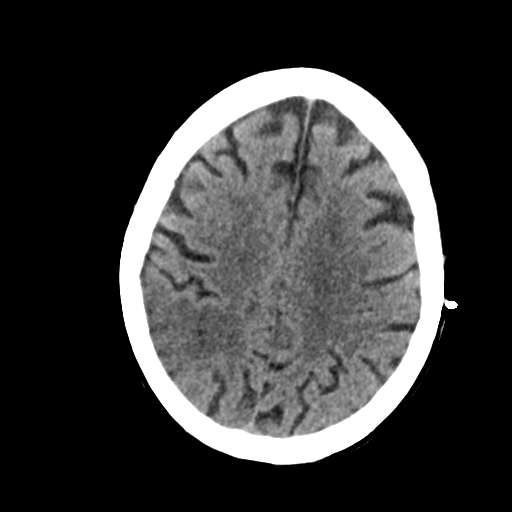
[im 22/30  brain]
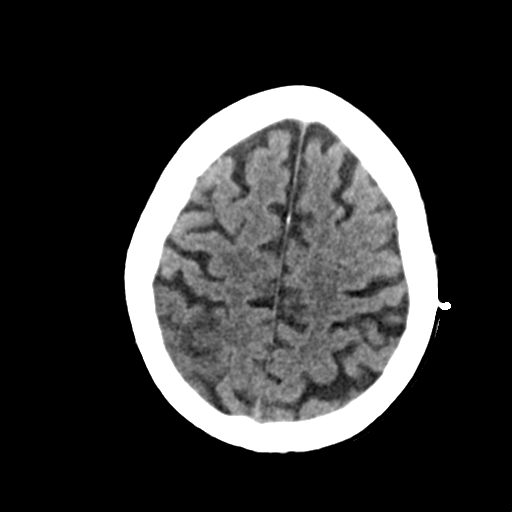
[im 23/30  brain]
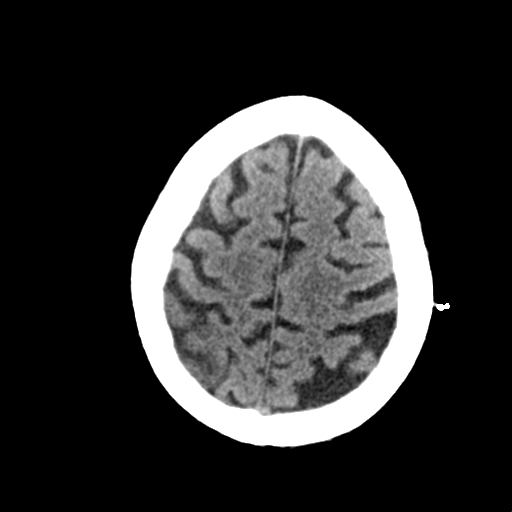
[im 23/30  bone]
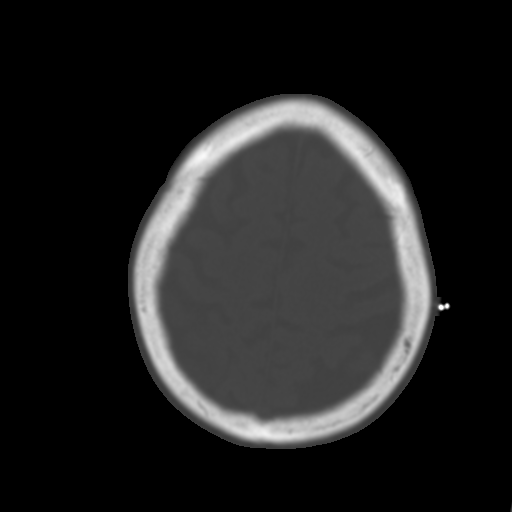
[im 25/30  brain]
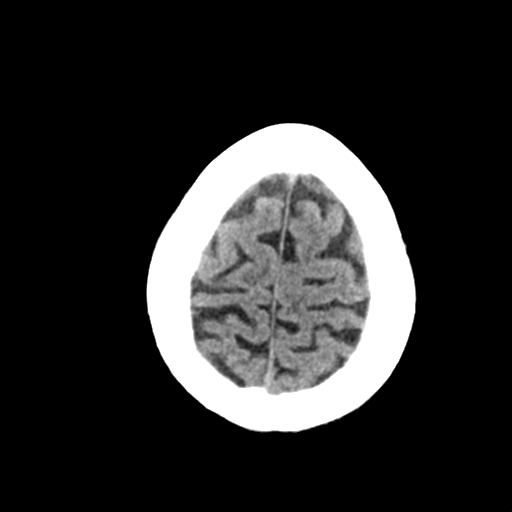
[im 27/30  brain]
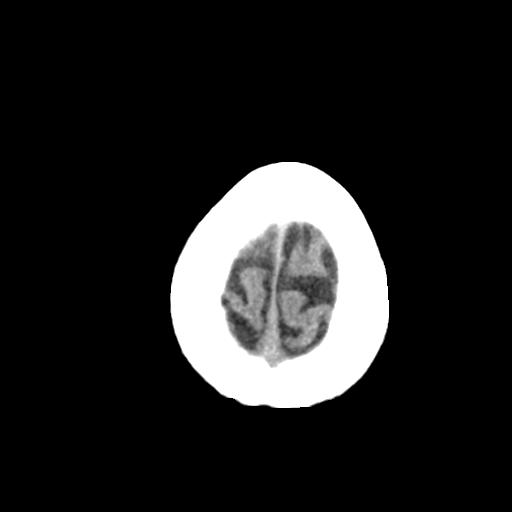
[im 29/30  brain]
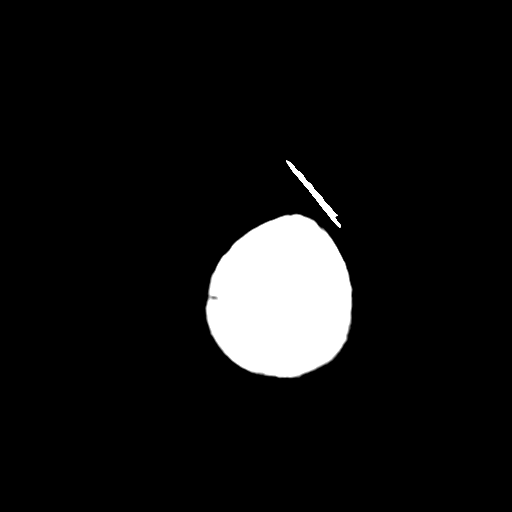

[16 of 30 positions shown; findings below may reference images not displayed]

FINDINGS: Subacute infarction is present in the posterior right MCA
territory with decreased attenuation of the gray and white matter.
Posterior fossa structures appear within normal limits.  There is
no hemorrhage.  Benign basal ganglia calcifications are present.
On image number 11, they are this ICU patient in the posterior MCA
branch, probably representing thrombosis.  There is no midline
shift or hydrocephalus.  The calvarium appears intact.
IMPRESSION: Developing posterior right MCA division infarct.  No hemorrhage or
complicating features in this patient with seizure.

## 2014-09-03 IMAGING — CR DG CHEST 1V PORT
1 series · 1 of 1 positions shown · non-contrast
Comparison: 10/27/2012

CLINICAL DATA: Cerebral infarction, respiratory failure and
extubation.

PORTABLE CHEST - 1 VIEW

[AP]
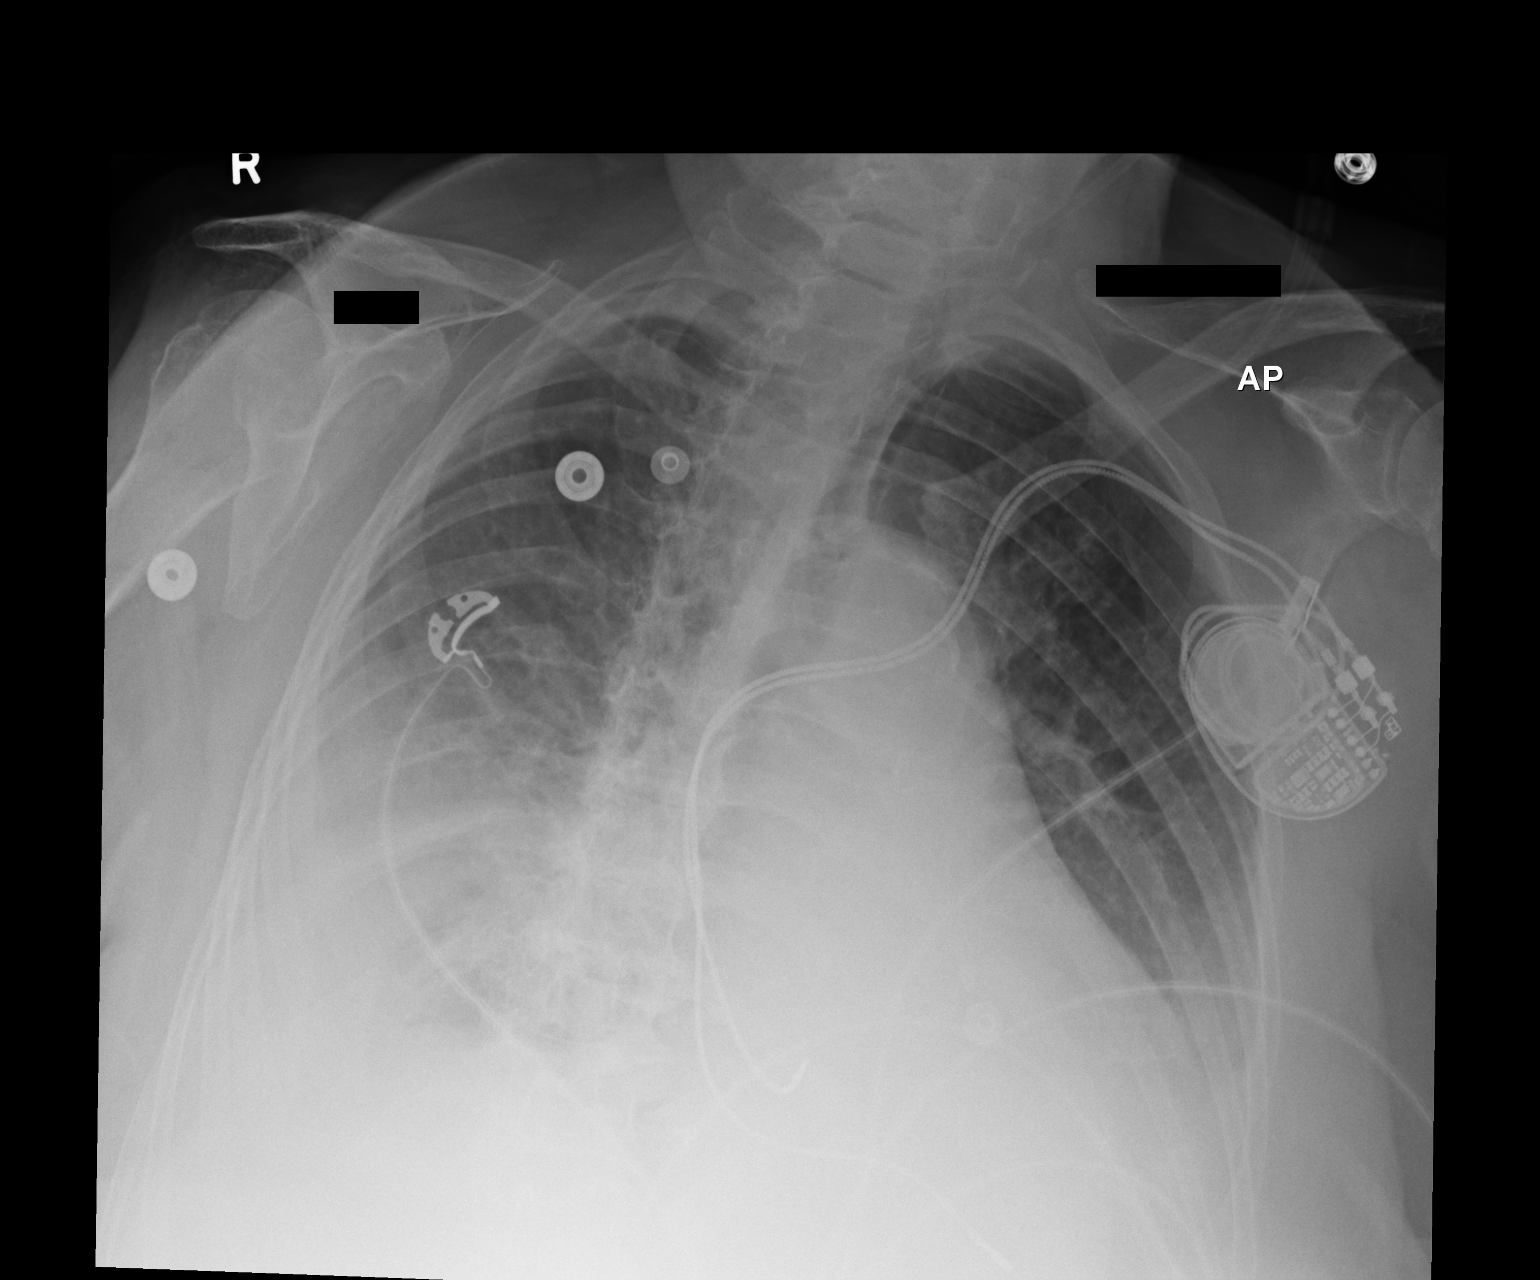

[1 of 1 positions shown; findings below may reference images not displayed]

FINDINGS: Endotracheal and nasogastric tubes been removed.
Pacemaker shows stable positioning.  There remain bilateral lower
lobe atelectasis/infiltrates with bilateral pleural effusions,
right greater than left.  The heart size is stable.  No overt
pulmonary edema is seen.
IMPRESSION: Stable bilateral lower lobe consolidation and pleural effusions.

## 2014-09-04 IMAGING — CR DG CHEST 1V PORT
1 series · 1 of 1 positions shown · non-contrast
Comparison: Plain film chest 10/27/2012 and 10/28/2012.

CLINICAL DATA: Right pleural effusion.

PORTABLE CHEST - 1 VIEW

[AP]
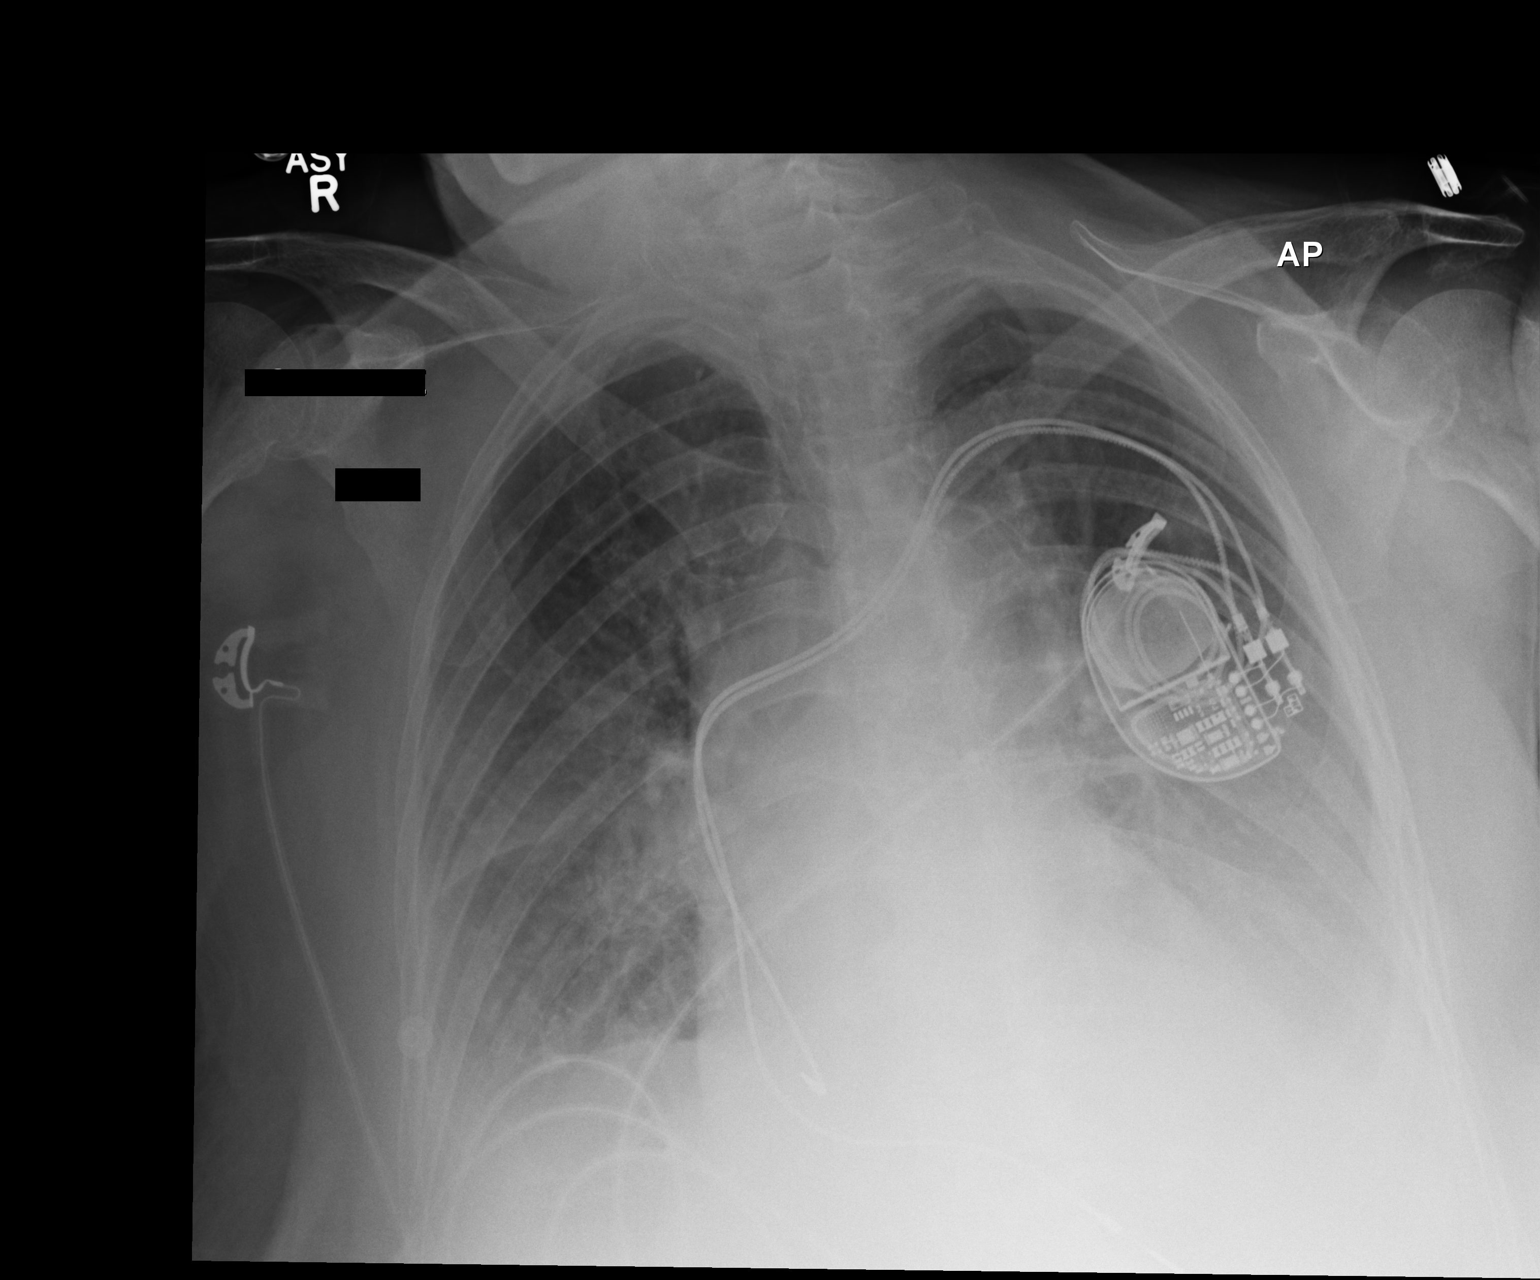

[1 of 1 positions shown; findings below may reference images not displayed]

FINDINGS: The patient's right pleural effusion and associated
atelectasis appear improved.  There has been interval worsening of
the patient's left pleural effusion and atelectasis.  Cardiomegaly
is noted.  No pneumothorax.
IMPRESSION: Decreased right and increased left pleural effusions and
atelectasis.
